# Patient Record
Sex: Male | Born: 1965
Health system: Southern US, Community
[De-identification: ages and names within clinical notes are randomized; demographics above are authoritative.]

## PROBLEM LIST (undated history)

## (undated) DIAGNOSIS — M109 Gout, unspecified: Secondary | ICD-10-CM

## (undated) DIAGNOSIS — K743 Primary biliary cirrhosis: Secondary | ICD-10-CM

## (undated) DIAGNOSIS — I639 Cerebral infarction, unspecified: Secondary | ICD-10-CM

## (undated) DIAGNOSIS — I10 Essential (primary) hypertension: Secondary | ICD-10-CM

## (undated) DIAGNOSIS — K219 Gastro-esophageal reflux disease without esophagitis: Secondary | ICD-10-CM

## (undated) DIAGNOSIS — R569 Unspecified convulsions: Secondary | ICD-10-CM

## (undated) HISTORY — DX: Primary biliary cirrhosis: K74.3

## (undated) HISTORY — DX: Unspecified convulsions: R56.9

## (undated) HISTORY — PX: OTHER SURGICAL HISTORY: SHX169

## (undated) HISTORY — DX: Cerebral infarction, unspecified: I63.9

## (undated) HISTORY — DX: Gastro-esophageal reflux disease without esophagitis: K21.9

---

## 2005-02-20 ENCOUNTER — Ambulatory Visit (HOSPITAL_COMMUNITY): Admission: RE | Admit: 2005-02-20 | Discharge: 2005-02-20 | Payer: Self-pay | Admitting: Family Medicine

## 2005-05-11 ENCOUNTER — Ambulatory Visit (HOSPITAL_COMMUNITY): Admission: RE | Admit: 2005-05-11 | Discharge: 2005-05-11 | Payer: Self-pay | Admitting: Family Medicine

## 2005-07-09 ENCOUNTER — Ambulatory Visit: Payer: Self-pay | Admitting: Internal Medicine

## 2005-07-10 ENCOUNTER — Ambulatory Visit (HOSPITAL_COMMUNITY): Admission: RE | Admit: 2005-07-10 | Discharge: 2005-07-10 | Payer: Self-pay | Admitting: Internal Medicine

## 2005-07-11 ENCOUNTER — Ambulatory Visit: Payer: Self-pay | Admitting: Internal Medicine

## 2007-02-06 ENCOUNTER — Ambulatory Visit (HOSPITAL_COMMUNITY): Admission: RE | Admit: 2007-02-06 | Discharge: 2007-02-06 | Payer: Self-pay | Admitting: Family Medicine

## 2009-03-10 ENCOUNTER — Encounter (INDEPENDENT_AMBULATORY_CARE_PROVIDER_SITE_OTHER): Payer: Self-pay | Admitting: General Surgery

## 2009-03-10 ENCOUNTER — Ambulatory Visit (HOSPITAL_COMMUNITY): Admission: RE | Admit: 2009-03-10 | Discharge: 2009-03-10 | Payer: Self-pay | Admitting: General Surgery

## 2010-07-23 ENCOUNTER — Encounter: Payer: Self-pay | Admitting: Internal Medicine

## 2012-04-14 ENCOUNTER — Ambulatory Visit (HOSPITAL_COMMUNITY)
Admission: RE | Admit: 2012-04-14 | Discharge: 2012-04-14 | Disposition: A | Discharge status: Home / Self Care | Payer: BC Managed Care – PPO | Source: Ambulatory Visit | Attending: Family Medicine | Admitting: Family Medicine

## 2012-04-14 ENCOUNTER — Other Ambulatory Visit (HOSPITAL_COMMUNITY): Payer: Self-pay | Admitting: Family Medicine

## 2012-04-14 DIAGNOSIS — S2341XA Sprain of ribs, initial encounter: Secondary | ICD-10-CM

## 2012-04-14 DIAGNOSIS — J984 Other disorders of lung: Secondary | ICD-10-CM | POA: Insufficient documentation

## 2012-04-14 DIAGNOSIS — R079 Chest pain, unspecified: Secondary | ICD-10-CM

## 2012-04-14 DIAGNOSIS — J9819 Other pulmonary collapse: Secondary | ICD-10-CM | POA: Insufficient documentation

## 2012-06-24 ENCOUNTER — Observation Stay (HOSPITAL_COMMUNITY)
Admission: EM | Admit: 2012-06-24 | Discharge: 2012-06-27 | DRG: 416 | Disposition: A | Discharge status: Home / Self Care | Payer: BC Managed Care – PPO | Attending: Internal Medicine | Admitting: Internal Medicine

## 2012-06-24 ENCOUNTER — Encounter (HOSPITAL_COMMUNITY): Payer: Self-pay | Admitting: *Deleted

## 2012-06-24 DIAGNOSIS — A498 Other bacterial infections of unspecified site: Secondary | ICD-10-CM | POA: Insufficient documentation

## 2012-06-24 DIAGNOSIS — R339 Retention of urine, unspecified: Secondary | ICD-10-CM | POA: Insufficient documentation

## 2012-06-24 DIAGNOSIS — N390 Urinary tract infection, site not specified: Secondary | ICD-10-CM | POA: Diagnosis present

## 2012-06-24 DIAGNOSIS — R000 Tachycardia, unspecified: Secondary | ICD-10-CM

## 2012-06-24 DIAGNOSIS — I4891 Unspecified atrial fibrillation: Secondary | ICD-10-CM | POA: Insufficient documentation

## 2012-06-24 DIAGNOSIS — K5900 Constipation, unspecified: Secondary | ICD-10-CM | POA: Insufficient documentation

## 2012-06-24 DIAGNOSIS — A419 Sepsis, unspecified organism: Secondary | ICD-10-CM | POA: Diagnosis present

## 2012-06-24 HISTORY — DX: Gout, unspecified: M10.9

## 2012-06-24 LAB — CBC WITH DIFFERENTIAL/PLATELET
Basophils Absolute: 0 10*3/uL (ref 0.0–0.1)
Basophils Relative: 0 % (ref 0–1)
Eosinophils Absolute: 0 10*3/uL (ref 0.0–0.7)
Eosinophils Relative: 0 % (ref 0–5)
HCT: 43.6 % (ref 39.0–52.0)
Hemoglobin: 15.7 g/dL (ref 13.0–17.0)
Lymphocytes Relative: 8 % — ABNORMAL LOW (ref 12–46)
Lymphs Abs: 0.9 10*3/uL (ref 0.7–4.0)
MCHC: 36 g/dL (ref 30.0–36.0)
MCV: 90.3 fL (ref 78.0–100.0)
Monocytes Absolute: 1 10*3/uL (ref 0.1–1.0)
Monocytes Relative: 8 % (ref 3–12)
Neutro Abs: 10.5 10*3/uL — ABNORMAL HIGH (ref 1.7–7.7)
Neutrophils Relative %: 84 % — ABNORMAL HIGH (ref 43–77)
Platelets: 129 10*3/uL — ABNORMAL LOW (ref 150–400)
RBC: 4.83 MIL/uL (ref 4.22–5.81)
WBC: 12.5 10*3/uL — ABNORMAL HIGH (ref 4.0–10.5)

## 2012-06-24 LAB — COMPREHENSIVE METABOLIC PANEL
ALT: 67 U/L — ABNORMAL HIGH (ref 0–53)
BUN: 13 mg/dL (ref 6–23)
CO2: 22 mEq/L (ref 19–32)
Calcium: 9.6 mg/dL (ref 8.4–10.5)
Chloride: 100 mEq/L (ref 96–112)
Creatinine, Ser: 1.17 mg/dL (ref 0.50–1.35)
GFR calc Af Amer: 84 mL/min — ABNORMAL LOW (ref >90)
GFR calc non Af Amer: 73 mL/min — ABNORMAL LOW (ref >90)
Glucose, Bld: 125 mg/dL — ABNORMAL HIGH (ref 70–99)
Potassium: 3.5 mEq/L (ref 3.5–5.1)
Sodium: 136 mEq/L (ref 135–145)
Total Bilirubin: 1.5 mg/dL — ABNORMAL HIGH (ref 0.3–1.2)
Total Protein: 7.8 g/dL (ref 6.0–8.3)

## 2012-06-24 LAB — URINE MICROSCOPIC-ADD ON

## 2012-06-24 LAB — URINALYSIS, ROUTINE W REFLEX MICROSCOPIC
Bilirubin Urine: NEGATIVE
Glucose, UA: NEGATIVE mg/dL
Ketones, ur: NEGATIVE mg/dL
Protein, ur: 30 mg/dL — AB
Specific Gravity, Urine: 1.028 (ref 1.005–1.030)
Urobilinogen, UA: 1 mg/dL (ref 0.0–1.0)

## 2012-06-24 MED ORDER — ONDANSETRON 4 MG PO TBDP
8.0000 mg | ORAL_TABLET | Freq: Once | ORAL | Status: AC
  Administered 2012-06-24: 8 mg via ORAL
  Filled 2012-06-24: qty 2

## 2012-06-24 MED ORDER — ACETAMINOPHEN 500 MG PO TABS
1000.0000 mg | ORAL_TABLET | Freq: Once | ORAL | Status: AC
  Administered 2012-06-24: 1000 mg via ORAL
  Filled 2012-06-24: qty 2

## 2012-06-24 MED ORDER — SODIUM CHLORIDE 0.9 % IV BOLUS (SEPSIS)
2000.0000 mL | Freq: Once | INTRAVENOUS | Status: AC
  Administered 2012-06-24: 2000 mL via INTRAVENOUS

## 2012-06-24 MED ORDER — OXYCODONE-ACETAMINOPHEN 5-325 MG PO TABS
1.0000 | ORAL_TABLET | Freq: Once | ORAL | Status: DC
  Filled 2012-06-24 (×2): qty 1

## 2012-06-24 MED ORDER — ONDANSETRON HCL 4 MG/2ML IJ SOLN
4.0000 mg | Freq: Once | INTRAMUSCULAR | Status: AC
  Administered 2012-06-24: 4 mg via INTRAVENOUS
  Filled 2012-06-24: qty 2

## 2012-06-24 NOTE — ED Provider Notes (Signed)
History     CSN: [id number]  Arrival date & time 06/24/12  2219   First MD Initiated Contact with Patient 06/24/12 2259      Chief Complaint  Patient presents with  . Urinary Retention    (Consider location/radiation/quality/duration/timing/severity/associated sxs/prior treatment) HPI Patient reports his difficulty urinating, onset yesterday "urine "just running out" urinating small amounts. Also, nausea diffuse headache and right knee pain and diminished appetite no treatment prior to coming here nothing makes symptoms better or worse no abdominal pain/. Knee pain feels like gout pain. He took one of his "gout pills" with partial relief knee pain denies abdominal pain denies cough no other complaint . No other associated symptoms. Past Medical History  Diagnosis Date  . Gout     Past Surgical History  Procedure Date  . Arm surgery    biceps repair   History reviewed. No pertinent family history.  History  Substance Use Topics  . Smoking status: Never Smoker   . Smokeless tobacco: Not on file  . Alcohol Use: No      Review of Systems  Genitourinary: Positive for dysuria and difficulty urinating.  Musculoskeletal: Positive for arthralgias.  Neurological: Positive for headaches.  All other systems reviewed and are negative.    Allergies  Review of patient's allergies indicates no known allergies.  Home Medications   Current Outpatient Rx  Name  Route  Sig  Dispense  Refill  . FEBUXOSTAT 40 MG PO TABS   Oral   Take 80 mg by mouth daily.           BP 160/98  Pulse 136  Temp 99.2 F (37.3 C) (Oral)  Resp 16  SpO2 96%  Physical Exam  Nursing note and vitals reviewed. Constitutional: He appears well-developed and well-nourished. He appears distressed.       Nontoxic appearing appears uncomfortable  HENT:  Head: Normocephalic and atraumatic.  Eyes: Conjunctivae normal are normal. Pupils are equal, round, and reactive to light.  Neck: Neck supple. No  tracheal deviation present. No thyromegaly present.  Cardiovascular: Regular rhythm.   No murmur heard.      Tachycardic  Pulmonary/Chest: Effort normal and breath sounds normal.  Abdominal: Soft. Bowel sounds are normal. He exhibits no distension. There is no tenderness.  Genitourinary: Rectum normal, prostate normal and penis normal. Guaiac negative stool.       Circumcised  Musculoskeletal: Normal range of motion. He exhibits no edema and no tenderness.  Neurological: He is alert. Coordination normal.  Skin: Skin is warm and dry. No rash noted.  Psychiatric: He has a normal mood and affect.   Results for orders placed during the hospital encounter of 06/24/12  CBC WITH DIFFERENTIAL      Component Value Range   WBC 12.5 (*) 4.0 - 10.5 K/uL   RBC 4.83  4.22 - 5.81 MIL/uL   Hemoglobin 15.7  13.0 - 17.0 g/dL   HCT 40.9  81.1 - 91.4 %   MCV 90.3  78.0 - 100.0 fL   MCH 32.5  26.0 - 34.0 pg   MCHC 36.0  30.0 - 36.0 g/dL   RDW 78.2  95.6 - 21.3 %   Platelets 129 (*) 150 - 400 K/uL   Neutrophils Relative 84 (*) 43 - 77 %   Neutro Abs 10.5 (*) 1.7 - 7.7 K/uL   Lymphocytes Relative 8 (*) 12 - 46 %   Lymphs Abs 0.9  0.7 - 4.0 K/uL   Monocytes Relative 8  3 -  12 %   Monocytes Absolute 1.0  0.1 - 1.0 K/uL   Eosinophils Relative 0  0 - 5 %   Eosinophils Absolute 0.0  0.0 - 0.7 K/uL   Basophils Relative 0  0 - 1 %   Basophils Absolute 0.0  0.0 - 0.1 K/uL  COMPREHENSIVE METABOLIC PANEL      Component Value Range   Sodium 136  135 - 145 mEq/L   Potassium 3.5  3.5 - 5.1 mEq/L   Chloride 100  96 - 112 mEq/L   CO2 22  19 - 32 mEq/L   Glucose, Bld 125 (*) 70 - 99 mg/dL   BUN 13  6 - 23 mg/dL   Creatinine, Ser 1.61  0.50 - 1.35 mg/dL   Calcium 9.6  8.4 - 09.6 mg/dL   Total Protein 7.8  6.0 - 8.3 g/dL   Albumin 4.1  3.5 - 5.2 g/dL   AST 41 (*) 0 - 37 U/L   ALT 67 (*) 0 - 53 U/L   Alkaline Phosphatase 76  39 - 117 U/L   Total Bilirubin 1.5 (*) 0.3 - 1.2 mg/dL   GFR calc non Af Amer 73  (*) >90 mL/min   GFR calc Af Amer 84 (*) >90 mL/min  URINALYSIS, ROUTINE W REFLEX MICROSCOPIC      Component Value Range   Color, Urine YELLOW  YELLOW   APPearance TURBID (*) CLEAR   Specific Gravity, Urine 1.028  1.005 - 1.030   pH 6.0  5.0 - 8.0   Glucose, UA NEGATIVE  NEGATIVE mg/dL   Hgb urine dipstick MODERATE (*) NEGATIVE   Bilirubin Urine NEGATIVE  NEGATIVE   Ketones, ur NEGATIVE  NEGATIVE mg/dL   Protein, ur 30 (*) NEGATIVE mg/dL   Urobilinogen, UA 1.0  0.0 - 1.0 mg/dL   Nitrite POSITIVE (*) NEGATIVE   Leukocytes, UA LARGE (*) NEGATIVE  URINE MICROSCOPIC-ADD ON      Component Value Range   Squamous Epithelial / LPF RARE  RARE   WBC, UA TOO NUMEROUS TO COUNT  <3 WBC/hpf   RBC / HPF 11-20  <3 RBC/hpf   Bacteria, UA MANY (*) RARE   Urine-Other MUCOUS PRESENT    CG4 I-STAT (LACTIC ACID)      Component Value Range   Lactic Acid, Venous 3.19 (*) 0.5 - 2.2 mmol/L   No results found.  ED Course  Procedures (including critical care time)  Date: 06/24/2012  Rate: 135  Rhythm: sinus tachycardia  QRS Axis: right  Intervals: normal  ST/T Wave abnormalities: nonspecific T wave changes  Conduction Disutrbances:none  Narrative Interpretation:   Old EKG Reviewed: none available   Labs Reviewed  CBC WITH DIFFERENTIAL  COMPREHENSIVE METABOLIC PANEL  URINALYSIS, ROUTINE W REFLEX MICROSCOPIC   No results found.   No diagnosis found.  12:20 AM patient reports still complains of headache, nausea has resolved after treatment with intravenous fluids, Zofran and Tylenol. Morphine ordered 1 AM reports no relief of headache after treatment with intravenous morphine. Remains alert Glasgow Coma Score 15  MDM  Code sepsis called Spoke with hospitalist physician Dr. Julian Reil. Plan admission for 23 hour observation Diagnosis #1 sepsis #2 urinary tract infection  #3 headache #4 right knee pain  CRITICAL CARE Performed by: Doug Sou   Total critical care time: 30  minute  Critical care time was exclusive of separately billable procedures and treating other patients.  Critical care was necessary to treat or prevent imminent or life-threatening deterioration.  Critical care  was time spent personally by me on the following activities: development of treatment plan with patient and/or surrogate as well as nursing, discussions with consultants, evaluation of patient's response to treatment, examination of patient, obtaining history from patient or surrogate, ordering and performing treatments and interventions, ordering and review of laboratory studies, ordering and review of radiographic studies, pulse oximetry and re-evaluation of patient's condition.    Doug Sou, MD 06/25/12 [phone]

## 2012-06-24 NOTE — ED Notes (Signed)
Bladder scanner reads no more than 70 ml of urine - Dr Rennis Chris updated

## 2012-06-24 NOTE — ED Notes (Signed)
Pt states is here for urinary retention since this am.  Denies ever having this.  States he is also nauseated but denies vomiting.

## 2012-06-25 ENCOUNTER — Inpatient Hospital Stay (HOSPITAL_COMMUNITY): Payer: BC Managed Care – PPO

## 2012-06-25 DIAGNOSIS — A419 Sepsis, unspecified organism: Secondary | ICD-10-CM | POA: Diagnosis present

## 2012-06-25 DIAGNOSIS — R000 Tachycardia, unspecified: Secondary | ICD-10-CM

## 2012-06-25 DIAGNOSIS — N390 Urinary tract infection, site not specified: Secondary | ICD-10-CM

## 2012-06-25 LAB — CBC
HCT: 42.1 % (ref 39.0–52.0)
Hemoglobin: 15.1 g/dL (ref 13.0–17.0)
Hemoglobin: 15.4 g/dL (ref 13.0–17.0)
MCHC: 35.9 g/dL (ref 30.0–36.0)
Platelets: 152 10*3/uL (ref 150–400)
RDW: 13.9 % (ref 11.5–15.5)
WBC: 16.2 10*3/uL — ABNORMAL HIGH (ref 4.0–10.5)

## 2012-06-25 LAB — CREATININE, SERUM
Creatinine, Ser: 1.06 mg/dL (ref 0.50–1.35)
GFR calc Af Amer: >90 mL/min (ref >90)
GFR calc non Af Amer: 82 mL/min — ABNORMAL LOW (ref >90)

## 2012-06-25 LAB — HEMOGLOBIN A1C
Hgb A1c MFr Bld: 5 % (ref <5.7)
Mean Plasma Glucose: 97 mg/dL (ref <117)

## 2012-06-25 LAB — BASIC METABOLIC PANEL
GFR calc Af Amer: >90 mL/min (ref >90)
GFR calc non Af Amer: 82 mL/min — ABNORMAL LOW (ref >90)
Glucose, Bld: 126 mg/dL — ABNORMAL HIGH (ref 70–99)
Potassium: 3.8 mEq/L (ref 3.5–5.1)
Sodium: 141 mEq/L (ref 135–145)

## 2012-06-25 LAB — GLUCOSE, CAPILLARY: Glucose-Capillary: 127 mg/dL — ABNORMAL HIGH (ref 70–99)

## 2012-06-25 LAB — CG4 I-STAT (LACTIC ACID): Lactic Acid, Venous: 3.19 mmol/L — ABNORMAL HIGH (ref 0.5–2.2)

## 2012-06-25 MED ORDER — SODIUM CHLORIDE 0.9 % IV BOLUS (SEPSIS)
1000.0000 mL | Freq: Once | INTRAVENOUS | Status: AC
  Administered 2012-06-25: 1000 mL via INTRAVENOUS

## 2012-06-25 MED ORDER — HEPARIN SODIUM (PORCINE) 5000 UNIT/ML IJ SOLN
5000.0000 [IU] | Freq: Three times a day (TID) | INTRAMUSCULAR | Status: DC
  Administered 2012-06-25 – 2012-06-27 (×8): 5000 [IU] via SUBCUTANEOUS
  Filled 2012-06-25 (×10): qty 1

## 2012-06-25 MED ORDER — DEXTROSE 5 % IV SOLN
1.0000 g | INTRAVENOUS | Status: DC
  Administered 2012-06-25 – 2012-06-27 (×2): 1 g via INTRAVENOUS
  Filled 2012-06-25 (×3): qty 10

## 2012-06-25 MED ORDER — GENTAMICIN SULFATE 40 MG/ML IJ SOLN: 1.5000 mg/kg | Freq: Once | INTRAVENOUS | Status: DC

## 2012-06-25 MED ORDER — OXYCODONE-ACETAMINOPHEN 5-325 MG PO TABS
1.0000 | ORAL_TABLET | Freq: Four times a day (QID) | ORAL | Status: DC | PRN
  Administered 2012-06-25: 2 via ORAL
  Filled 2012-06-25: qty 2

## 2012-06-25 MED ORDER — SODIUM CHLORIDE 0.9 % IV SOLN
INTRAVENOUS | Status: DC
  Administered 2012-06-25 – 2012-06-27 (×5): via INTRAVENOUS

## 2012-06-25 MED ORDER — DEXTROSE 5 % IV SOLN
1.0000 g | Freq: Once | INTRAVENOUS | Status: AC
  Administered 2012-06-25: 1 g via INTRAVENOUS
  Filled 2012-06-25: qty 10

## 2012-06-25 MED ORDER — DEXTROSE 5 % IV SOLN
110.0000 mg | Freq: Once | INTRAVENOUS | Status: AC
  Administered 2012-06-25: 110 mg via INTRAVENOUS
  Filled 2012-06-25: qty 2.75

## 2012-06-25 MED ORDER — POTASSIUM CHLORIDE CRYS ER 20 MEQ PO TBCR
40.0000 meq | EXTENDED_RELEASE_TABLET | Freq: Once | ORAL | Status: AC
  Administered 2012-06-25: 40 meq via ORAL
  Filled 2012-06-25: qty 2

## 2012-06-25 MED ORDER — HYDROMORPHONE HCL PF 1 MG/ML IJ SOLN
0.5000 mg | INTRAMUSCULAR | Status: DC | PRN
  Administered 2012-06-25 (×3): 0.5 mg via INTRAVENOUS
  Filled 2012-06-25 (×4): qty 1

## 2012-06-25 MED ORDER — MORPHINE SULFATE 4 MG/ML IJ SOLN
4.0000 mg | Freq: Once | INTRAMUSCULAR | Status: AC
  Administered 2012-06-25: 4 mg via INTRAVENOUS
  Filled 2012-06-25: qty 1

## 2012-06-25 MED ORDER — HYDROMORPHONE HCL PF 1 MG/ML IJ SOLN
1.0000 mg | Freq: Once | INTRAMUSCULAR | Status: AC
  Administered 2012-06-25: 1 mg via INTRAVENOUS

## 2012-06-25 MED ORDER — HYDROMORPHONE HCL PF 1 MG/ML IJ SOLN
1.0000 mg | INTRAMUSCULAR | Status: DC | PRN
  Administered 2012-06-25 (×2): 1 mg via INTRAVENOUS
  Filled 2012-06-25 (×2): qty 1

## 2012-06-25 MED ORDER — FEBUXOSTAT 40 MG PO TABS
80.0000 mg | ORAL_TABLET | Freq: Every day | ORAL | Status: DC
  Administered 2012-06-25 – 2012-06-27 (×3): 80 mg via ORAL
  Filled 2012-06-25 (×3): qty 2

## 2012-06-25 MED ORDER — OXYCODONE-ACETAMINOPHEN 5-325 MG PO TABS
1.0000 | ORAL_TABLET | ORAL | Status: DC | PRN
  Administered 2012-06-25 – 2012-06-27 (×6): 2 via ORAL
  Filled 2012-06-25 (×6): qty 2

## 2012-06-25 MED ORDER — PHENAZOPYRIDINE HCL 200 MG PO TABS
200.0000 mg | ORAL_TABLET | Freq: Three times a day (TID) | ORAL | Status: AC
  Administered 2012-06-25 – 2012-06-26 (×6): 200 mg via ORAL
  Filled 2012-06-25 (×6): qty 1

## 2012-06-25 MED ORDER — SODIUM CHLORIDE 0.9 % IJ SOLN
3.0000 mL | Freq: Two times a day (BID) | INTRAMUSCULAR | Status: DC
  Administered 2012-06-26 – 2012-06-27 (×2): 3 mL via INTRAVENOUS

## 2012-06-25 NOTE — Progress Notes (Signed)
Pt bladder pain 10/10 with BP 153/102 and HR 112. MD on-call notified, order given for Dilaudid 1mg  IV now and Pyridium 200mg  PO TID. Will continue to monitor.  Harless Litten, RN 06/25/12

## 2012-06-25 NOTE — Progress Notes (Signed)
[facility] PROGRESS NOTE  Jason Mcmahon:[id number] DOB: 21-Aug-1965 DOA: 06/24/2012 PCP: Colette Ribas, MD  HPI/Subjective: She complaining about a lot of suprapubic pain and irritation   Assessment/Plan:  Sepsis -Apparent with tachycardia, fever and leukocytosis. -Likely secondary to UTI (urosepsis) patient started on Rocephin. -Urine and blood cultures pending.  UTI -Patient has urinalysis consistent with UTI, has very severe UTI symptoms. -Started on Rocephin empirically, we will adjust antibiotics according to the culture results. -I will obtain abdominal ultrasound to rule out anatomical abnormalities.  Tachycardia -Secondary to his sepsis and pain. 12-lead EKG shows sinus tachycardia. -Manage his pain symptomatically and hydrated aggressively with IV fluids.   Code Status: Full code Family Communication: Spoken the patient, his wife is at bedside too. Disposition Plan: Remains inpatient  Consultants:  None  Procedures:  None  Antibiotics:  Rocephin started 06/24/2012  Objective: Filed Vitals:   06/25/12 0415 06/25/12 0429 06/25/12 0515 06/25/12 0605  BP: 168/106 168/106 135/92 153/102  Pulse: 142 148 104 112  Temp:    98.1 F (36.7 C)  TempSrc:    Oral  Resp:      Height:      Weight:      SpO2: 98% 98%  95%    Intake/Output Summary (Last 24 hours) at 06/25/12 1111 Last data filed at 06/25/12 0538  Gross per 24 hour  Intake      0 ml  Output    700 ml  Net   -700 ml   Filed Weights   06/25/12 0106  Weight: 87.544 kg (193 lb)    Exam:  General: Alert and awake, oriented x3, not in any acute distress. HEENT: anicteric sclera, pupils reactive to light and accommodation, EOMI CVS: S1-S2 clear, no murmur rubs or gallops Chest: clear to auscultation bilaterally, no wheezing, rales or rhonchi Abdomen: soft nontender, nondistended, normal bowel sounds, no organomegaly Extremities: no cyanosis, clubbing or edema noted  bilaterally Neuro: Cranial nerves II-XII intact, no focal neurological deficits  Data Reviewed: Basic Metabolic Panel:  Lab 06/25/12 8119 06/25/12 0424 06/24/12 2318  NA 141 -- 136  K 3.8 -- 3.5  CL 107 -- 100  CO2 20 -- 22  GLUCOSE 126* -- 125*  BUN 11 -- 13  CREATININE 1.06 1.06 1.17  CALCIUM 9.5 -- 9.6  MG -- -- --  PHOS -- -- --   Liver Function Tests:  Lab 06/24/12 2318  AST 41*  ALT 67*  ALKPHOS 76  BILITOT 1.5*  PROT 7.8  ALBUMIN 4.1   No results found for this basename: LIPASE:5,AMYLASE:5 in the last 168 hours No results found for this basename: AMMONIA:5 in the last 168 hours CBC:  Lab 06/25/12 0425 06/25/12 0424 06/24/12 2318  WBC 16.4* 16.2* 12.5*  NEUTROABS -- -- 10.5*  HGB 15.4 15.1 15.7  HCT 42.9 42.1 43.6  MCV 90.3 90.3 90.3  PLT 152 150 129*   Cardiac Enzymes: No results found for this basename: CKTOTAL:5,CKMB:5,CKMBINDEX:5,TROPONINI:5 in the last 168 hours BNP (last 3 results) No results found for this basename: PROBNP:3 in the last 8760 hours CBG:  Lab 06/25/12 0723  GLUCAP 127*    No results found for this or any previous visit (from the past 240 hour(s)).   Studies: No results found.  Scheduled Meds:   . cefTRIAXone (ROCEPHIN)  IV  1 g Intravenous Q24H  . febuxostat  80 mg Oral Daily  . heparin  5,000 Units Subcutaneous Q8H  . phenazopyridine  200 mg Oral  TID WC  . sodium chloride  1,000 mL Intravenous Once  . sodium chloride  3 mL Intravenous Q12H   Continuous Infusions:   . sodium chloride 125 mL/hr at 06/25/12 0451    Principal Problem:  *Sepsis secondary to UTI    Time spent: 45 minutes    [employer] [facility]  [facility] Pager [phone]. If 8PM-8AM, please contact night-coverage at [email or web address], password [facility] 06/25/2012, 11:11 AM  LOS: 1 day

## 2012-06-25 NOTE — ED Notes (Signed)
Rectal temp 101.0 taken md aware.

## 2012-06-25 NOTE — Progress Notes (Signed)
Patient has only voided 200 ml of urine since breakfast.  Bladder distended, palpable & patient having extreme pain.  Bladder scan revealed volume of 736 ml in bladder currently.  MD notified.  Will continue to monitor. Nolon Nations

## 2012-06-25 NOTE — H&P (Signed)
[facility] History and Physical  Jason Mcmahon NWG:[id number] DOB: Jul 28, 1965 DOA: 06/24/2012  Referring physician: ED PCP: Jason Ribas, MD  Specialists: None  Chief Complaint: Dysuria  HPI: Jason Mcmahon is a 46 y.o. male who presents with c/o feeling like he is having difficulty urinating.  Patient c/o pain and burning on urination onset yesterday.  Also associated with nausea, headache, and poor appetite.  Nothing seems to make these symptoms better nor worse.  When he tries to go to the bathroom he has sensation of incomplete emptying.  In the ED patient was found to have a UTI, with associated sepsis 3/4 SIRS criteria including fever, tachycardia, and leukocytosis.  Despite his feeling of incomplete bladder emptying a bladder scan revealed only 70cc of urine remaining after urination thus providing no evidence of retention.  Hospitalist has been asked to admit the patient.  Review of Systems: no subjective fever, chills, vomiting, diarrhea, 12 systems reviewed and otherwise negative.  Past Medical History  Diagnosis Date  . Gout    Past Surgical History  Procedure Date  . Arm surgery    Social History:  reports that he has never smoked. He does not have any smokeless tobacco history on file. He reports that he does not drink alcohol or use illicit drugs.   No Known Allergies  History reviewed. No pertinent family history.   Prior to Admission medications   Medication Sig Start Date End Date Taking? Authorizing Provider  febuxostat (ULORIC) 40 MG tablet Take 80 mg by mouth daily.   Yes Historical Provider, MD   Physical Exam: Filed Vitals:   06/25/12 0115 06/25/12 0145 06/25/12 0230 06/25/12 0245  BP: 149/92 140/89 134/97 120/77  Pulse: 112 118 117 113  Temp:      TempSrc:      Resp:      Height:      Weight:      SpO2: 95% 96% 94% 91%    General:  NAD, appears uncomfortable from dysuria sensation however Eyes: PEERLA EOMI ENT: mucous membranes  moist Neck: supple w/o JVD Cardiovascular: RRR w/o MRG Respiratory: CTA B Abdomen: soft, nt, nd, bs+ Skin: no rash nor lesion Musculoskeletal: MAE, full ROM all 4 extremities Psychiatric: normal tone and affect Neurologic: AAOx3, grossly non-focal  Labs on Admission:  Basic Metabolic Panel:  Lab 06/24/12 5784  NA 136  K 3.5  CL 100  CO2 22  GLUCOSE 125*  BUN 13  CREATININE 1.17  CALCIUM 9.6  MG --  PHOS --   Liver Function Tests:  Lab 06/24/12 2318  AST 41*  ALT 67*  ALKPHOS 76  BILITOT 1.5*  PROT 7.8  ALBUMIN 4.1   No results found for this basename: LIPASE:5,AMYLASE:5 in the last 168 hours No results found for this basename: AMMONIA:5 in the last 168 hours CBC:  Lab 06/24/12 2318  WBC 12.5*  NEUTROABS 10.5*  HGB 15.7  HCT 43.6  MCV 90.3  PLT 129*   Cardiac Enzymes: No results found for this basename: CKTOTAL:5,CKMB:5,CKMBINDEX:5,TROPONINI:5 in the last 168 hours  BNP (last 3 results) No results found for this basename: PROBNP:3 in the last 8760 hours CBG: No results found for this basename: GLUCAP:5 in the last 168 hours  Radiological Exams on Admission: No results found.  EKG: Independently reviewed.  Assessment/Plan Principal Problem:  *Sepsis secondary to UTI   1. Sepsis secondary to UTI - patient received rocephin and gent in ED, will continue the rocephin, cultures pending, received 2L  ivf bolus with subsequent improvement in tachycardia in ED, will continue fluids at 125 cc/hr.  Admit patient to obs, likely can go home once his symptoms improve.  Will treat his pain with low dose of dilaudid while here for dysuria symptoms.  Code Status: Full Code (must indicate code status--if unknown or must be presumed, indicate so) Family Communication: spoke with wife at bedside (indicate person spoken with, if applicable, with phone number if by telephone) Disposition Plan: Admit to obs (indicate anticipated LOS)  Time spent: 50 min  GARDNER,  JARED M. [facility] Pager [phone]  If 7PM-7AM, please contact night-coverage [email or web address] Password [facility] 06/25/2012, 4:27 AM

## 2012-06-25 NOTE — Progress Notes (Signed)
In & Out Cath performed and yielded 900 ml of urine.  Will continue to monitor. Nolon Nations

## 2012-06-25 NOTE — ED Notes (Signed)
Patient given adult diaper per request. States "I keep getting up to pee and it hurts. It's just a trickle I can't really go." Family member assisted placement of diaper.

## 2012-06-25 NOTE — ED Notes (Signed)
Hospitalist at bedside 

## 2012-06-25 NOTE — ED Notes (Signed)
Pt states would like to try to have bm.  Bedside commode  Placed at bedside.

## 2012-06-25 NOTE — Progress Notes (Signed)
In & Out cath performed on patient while yielded 750 ml of urine.  Patient stated he felt immediate relief of his bladder pressure.  Will continue to monitor. Nolon Nations

## 2012-06-25 NOTE — ED Notes (Signed)
Report given to rn.  Pt transported via stretcher to floor.

## 2012-06-26 LAB — URINE CULTURE: Colony Count: >=100000

## 2012-06-26 LAB — COMPREHENSIVE METABOLIC PANEL
ALT: 43 U/L (ref 0–53)
AST: 20 U/L (ref 0–37)
Alkaline Phosphatase: 72 U/L (ref 39–117)
CO2: 23 mEq/L (ref 19–32)
Chloride: 104 mEq/L (ref 96–112)
GFR calc Af Amer: >90 mL/min (ref >90)
GFR calc non Af Amer: >90 mL/min (ref >90)
Glucose, Bld: 94 mg/dL (ref 70–99)
Potassium: 3.8 mEq/L (ref 3.5–5.1)
Sodium: 138 mEq/L (ref 135–145)

## 2012-06-26 LAB — CBC
Hemoglobin: 12.9 g/dL — ABNORMAL LOW (ref 13.0–17.0)
MCH: 32.3 pg (ref 26.0–34.0)
Platelets: 114 10*3/uL — ABNORMAL LOW (ref 150–400)
RBC: 4 MIL/uL — ABNORMAL LOW (ref 4.22–5.81)
WBC: 8.8 10*3/uL (ref 4.0–10.5)

## 2012-06-26 LAB — OCCULT BLOOD, POC DEVICE: Fecal Occult Bld: NEGATIVE

## 2012-06-26 MED ORDER — PANTOPRAZOLE SODIUM 40 MG PO TBEC
40.0000 mg | DELAYED_RELEASE_TABLET | Freq: Every day | ORAL | Status: DC
  Administered 2012-06-26 – 2012-06-27 (×2): 40 mg via ORAL
  Filled 2012-06-26 (×2): qty 1

## 2012-06-26 MED ORDER — POTASSIUM CHLORIDE CRYS ER 20 MEQ PO TBCR
EXTENDED_RELEASE_TABLET | ORAL | Status: AC
  Filled 2012-06-26: qty 2

## 2012-06-26 MED ORDER — CALCIUM CARBONATE ANTACID 500 MG PO CHEW
1.0000 | CHEWABLE_TABLET | Freq: Three times a day (TID) | ORAL | Status: DC
  Administered 2012-06-26 – 2012-06-27 (×5): 200 mg via ORAL
  Filled 2012-06-26 (×6): qty 1

## 2012-06-26 MED ORDER — POTASSIUM CHLORIDE CRYS ER 20 MEQ PO TBCR
40.0000 meq | EXTENDED_RELEASE_TABLET | Freq: Once | ORAL | Status: AC
  Administered 2012-06-26: 40 meq via ORAL

## 2012-06-26 NOTE — Progress Notes (Signed)
Around 0000 Pt stated his bladder felt full. A bladder scan was done showing 625 cc.urine. An in and out cath was attempted no resistance was met. However, there was no urine return. Md made aware new order received for a foley. A foley was attempted however there was no urine return. The balloon was not inflated.  Np on call made aware. New order received for a coude cath. Coude cath was inserted pt drained 700 cc of urine. Pt is now resting in bed.

## 2012-06-26 NOTE — Progress Notes (Signed)
[facility] PROGRESS NOTE  VICENTE WEIDLER OZD:[id number] DOB: [birth year] DOA: 06/24/2012 PCP: Colette Ribas, MD  HPI/Subjective: Bladder pain is much better after catheter insertion   Assessment/Plan:  Sepsis -Apparent with tachycardia, fever and leukocytosis. -Likely secondary to UTI (urosepsis) patient started on Rocephin. -Urine and blood cultures pending.  UTI -Patient has urinalysis consistent with UTI, culture growing Escherichia coli, susceptibility is pending -Started on Rocephin empirically, we will adjust antibiotics according to the culture results. -I will obtain abdominal ultrasound to rule out anatomical abnormalities.  Tachycardia -Secondary to his sepsis and pain. 12-lead EKG shows sinus tachycardia. This is improved today. -Manage his pain symptomatically and hydrated aggressively with IV fluids.  Urinary retention -Robinson catheter used for more than 3 times, Foley catheter insertion was unsuccessful. -Placement of coud catheter. Urine retention is likely secondary to narcotics.  Code Status: Full code Family Communication: Spoken the patient, his wife is at bedside too. Disposition Plan: Remains inpatient  Consultants:  None  Procedures:  None  Antibiotics:  Rocephin started 06/24/2012  Objective: Filed Vitals:   06/25/12 0605 06/25/12 1330 06/25/12 2100 06/26/12 0500  BP: 153/102 144/90 138/87 155/94  Pulse: 112 106 103 100  Temp: 98.1 F (36.7 C) 98.7 F (37.1 C) 98.1 F (36.7 C) 98.2 F (36.8 C)  TempSrc: Oral     Resp:  20 18 18   Height:  5\' 8"  (1.727 m)    Weight:  87.544 kg (193 lb)  87.816 kg (193 lb 9.6 oz)  SpO2: 95% 97% 95% 93%    Intake/Output Summary (Last 24 hours) at 06/26/12 1023 Last data filed at 06/26/12 0600  Gross per 24 hour  Intake 4623.75 ml  Output   4200 ml  Net 423.75 ml   Filed Weights   06/25/12 0106 06/25/12 1330 06/26/12 0500  Weight: 87.544 kg (193 lb) 87.544 kg (193 lb) 87.816 kg  (193 lb 9.6 oz)    Exam:  General: Alert and awake, oriented x3, not in any acute distress. HEENT: anicteric sclera, pupils reactive to light and accommodation, EOMI CVS: S1-S2 clear, no murmur rubs or gallops Chest: clear to auscultation bilaterally, no wheezing, rales or rhonchi Abdomen: soft nontender, nondistended, normal bowel sounds, no organomegaly Extremities: no cyanosis, clubbing or edema noted bilaterally Neuro: Cranial nerves II-XII intact, no focal neurological deficits  Data Reviewed: Basic Metabolic Panel:  Lab 06/26/12 2595 06/25/12 0425 06/25/12 0424 06/24/12 2318  NA 138 141 -- 136  K 3.8 3.8 -- 3.5  CL 104 107 -- 100  CO2 23 20 -- 22  GLUCOSE 94 126* -- 125*  BUN 10 11 -- 13  CREATININE 0.99 1.06 1.06 1.17  CALCIUM 9.0 9.5 -- 9.6  MG -- -- -- --  PHOS -- -- -- --   Liver Function Tests:  Lab 06/26/12 0615 06/24/12 2318  AST 20 41*  ALT 43 67*  ALKPHOS 72 76  BILITOT 1.6* 1.5*  PROT 7.1 7.8  ALBUMIN 3.5 4.1   No results found for this basename: LIPASE:5,AMYLASE:5 in the last 168 hours No results found for this basename: AMMONIA:5 in the last 168 hours CBC:  Lab 06/26/12 0615 06/25/12 0425 06/25/12 0424 06/24/12 2318  WBC 8.8 16.4* 16.2* 12.5*  NEUTROABS -- -- -- 10.5*  HGB 12.9* 15.4 15.1 15.7  HCT 36.9* 42.9 42.1 43.6  MCV 92.3 90.3 90.3 90.3  PLT 114* 152 150 129*   Cardiac Enzymes: No results found for this basename: CKTOTAL:5,CKMB:5,CKMBINDEX:5,TROPONINI:5 in the last 168 hours BNP (  last 3 results) No results found for this basename: PROBNP:3 in the last 8760 hours CBG:  Lab 06/26/12 0808 06/25/12 0723  GLUCAP 87 127*    Recent Results (from the past 240 hour(s))  URINE CULTURE     Status: Normal (Preliminary result)   Collection Time   06/24/12 10:45 PM      Component Value Range Status Comment   Specimen Description URINE, RANDOM   Final    Special Requests CX ADDED AT 2309 ON [id number]   Final    Culture  Setup Time 06/25/2012  00:42   Final    Colony Count >=100,000 COLONIES/ML   Final    Culture ESCHERICHIA COLI   Final    Report Status PENDING   Incomplete   CULTURE, BLOOD (ROUTINE X 2)     Status: Normal (Preliminary result)   Collection Time   06/25/12 12:30 AM      Component Value Range Status Comment   Specimen Description BLOOD RIGHT ARM   Final    Special Requests BOTTLES DRAWN AEROBIC AND ANAEROBIC 10CC EACH   Final    Culture  Setup Time 06/25/2012 04:15   Final    Culture     Final    Value:        BLOOD CULTURE RECEIVED NO GROWTH TO DATE CULTURE WILL BE HELD FOR 5 DAYS BEFORE ISSUING A FINAL NEGATIVE REPORT   Report Status PENDING   Incomplete   CULTURE, BLOOD (ROUTINE X 2)     Status: Normal (Preliminary result)   Collection Time   06/25/12 12:32 AM      Component Value Range Status Comment   Specimen Description BLOOD RIGHT HAND   Final    Special Requests BOTTLES DRAWN AEROBIC ONLY 8CC   Final    Culture  Setup Time 06/25/2012 04:15   Final    Culture     Final    Value:        BLOOD CULTURE RECEIVED NO GROWTH TO DATE CULTURE WILL BE HELD FOR 5 DAYS BEFORE ISSUING A FINAL NEGATIVE REPORT   Report Status PENDING   Incomplete      Studies: US Renal  06/25/2012  *RADIOLOGY REPORT*  Clinical Data: UTI and sepsis.  RENAL/URINARY TRACT ULTRASOUND COMPLETE  Comparison:  07/10/2005  Findings:  Right Kidney:  10.19 cm in length.  Normal renal cortex.  No obstruction or mass.  Left Kidney:  11.2 cm in length.  Normal renal cortex without obstruction or mass.  Bladder:  Normal bladder without thickening or mass.  IMPRESSION: Normal   Original Report Authenticated By: Janeece Riggers, M.D.     Scheduled Meds:    . calcium carbonate  1 tablet Oral TID WC  . cefTRIAXone (ROCEPHIN)  IV  1 g Intravenous Q24H  . febuxostat  80 mg Oral Daily  . heparin  5,000 Units Subcutaneous Q8H  . pantoprazole  40 mg Oral Daily  . phenazopyridine  200 mg Oral TID WC  . sodium chloride  3 mL Intravenous Q12H    Continuous Infusions:    . sodium chloride 125 mL/hr at 06/26/12 5621    Principal Problem:  *Sepsis Active Problems:  UTI (lower urinary tract infection)  Tachycardia    Time spent: 45 minutes    [facility]  [facility] Pager [phone]. If 8PM-8AM, please contact night-coverage at [email or web address], password [facility] 06/26/2012, 10:23 AM  LOS: 2 days

## 2012-06-27 LAB — BASIC METABOLIC PANEL
CO2: 22 mEq/L (ref 19–32)
Calcium: 9.4 mg/dL (ref 8.4–10.5)
Chloride: 101 mEq/L (ref 96–112)
Creatinine, Ser: 1.07 mg/dL (ref 0.50–1.35)
Glucose, Bld: 90 mg/dL (ref 70–99)

## 2012-06-27 LAB — GLUCOSE, CAPILLARY: Glucose-Capillary: 92 mg/dL (ref 70–99)

## 2012-06-27 MED ORDER — OXYCODONE-ACETAMINOPHEN 5-325 MG PO TABS: 1.0000 | ORAL_TABLET | ORAL | Status: DC | PRN

## 2012-06-27 MED ORDER — CIPROFLOXACIN HCL 500 MG PO TABS: 500.0000 mg | ORAL_TABLET | Freq: Two times a day (BID) | ORAL | Status: DC

## 2012-06-27 NOTE — Care Management Note (Signed)
    Page 1 of 1   06/27/2012     1:45:24 PM   CARE MANAGEMENT NOTE 06/27/2012  Patient:  Jason Mcmahon, Jason Mcmahon   Account Number:  [id number]  Date Initiated:  06/27/2012  Documentation initiated by:  GRAVES-BIGELOW,Jossalyn Forgione  Subjective/Objective Assessment:   Pt admitted with dysuria and increased WBC. IV ABX and IV hydration.     Action/Plan:   Pt planned for d/c today. No needs from CM at this time.   Anticipated DC Date:  06/27/2012   Anticipated DC Plan:  HOME/SELF CARE      DC Planning Services  CM consult      Choice offered to / List presented to:             Status of service:  Completed, signed off Medicare Important Message given?   (If response is "NO", the following Medicare IM given date fields will be blank) Date Medicare IM given:   Date Additional Medicare IM given:    Discharge Disposition:  HOME/SELF CARE  Per UR Regulation:  Reviewed for med. necessity/level of care/duration of stay  If discussed at Long Length of Stay Meetings, dates discussed:    Comments:

## 2012-06-27 NOTE — Progress Notes (Signed)
UR Completed Bertice Risse Graves-Bigelow, RN,BSN [phone]  

## 2012-06-27 NOTE — Discharge Summary (Addendum)
Physician Discharge Summary  Jason Mcmahon ZOX:[id number] DOB: 07/20/1965 DOA: 06/24/2012  PCP: Colette Ribas, MD  Admit date: 06/24/2012 Discharge date: 06/27/2012  Time spent: 40 minutes  Recommendations for Outpatient Follow-up:  1. followup with primary care physician in one week  Discharge Diagnoses:  Principal Problem:  *Sepsis Active Problems:  UTI (lower urinary tract infection)  Tachycardia   Discharge Condition: stable  Diet recommendation: regular  Filed Weights   06/25/12 1330 06/26/12 0500 06/27/12 0512  Weight: 87.544 kg (193 lb) 87.816 kg (193 lb 9.6 oz) 87.5 kg (192 lb 14.4 oz)    History of present illness:  Jason Mcmahon is a 46 y.o. male who presents with c/o feeling like he is having difficulty urinating. Patient c/o pain and burning on urination onset yesterday. Also associated with nausea, headache, and poor appetite. Nothing seems to make these symptoms better nor worse. When he tries to go to the bathroom he has sensation of incomplete emptying.  In the ED patient was found to have a UTI, with associated sepsis 3/4 SIRS criteria including fever, tachycardia, and leukocytosis. Despite his feeling of incomplete bladder emptying a bladder scan revealed only 70cc of urine remaining after urination thus providing no evidence of retention. Hospitalist has been asked to admit the patient.   Hospital Course:    1. Sepsis: Apparent with tachycardia, fever or leukocytosis along with UTI. the Sepsis syndrome is likely secondary to UTI. At the time of admission patient was started empirically on Rocephin, both urine and blood cultures were done. Blood cultures stayed negative to date but urine showed pansensitive Escherichia coli. The antibiotic was switched to ciprofloxacin. Patient to complete a total 14 days of antibiotics.  2. UTI: As mentioned above the time of admission patient was presented with urinalysis consistent with UTI, empirically started on  Rocephin. The culture grew Escherichia coli. Ultrasound was done and showed no anatomical abnormalities. Please note while he was in the hospital patient developed some urinary retention and constipation likely secondary to narcotics.   3. Tachycardia secondary to his sepsis and pain. 12-lead EKG was done and showed sinus tachycardia, this was managed with aggressive hydration and pain medications. Patient was complaining about a lot of her rotation around his bladder area.  4. Urinary retention: Patient did have some problems with urinary retention while in the hospital, initially in and out cath was used, then coud catheter was placed. It was thought initially to be secondary to narcotic pain medications. On the day of discharge the catheter was discontinued and patient was not able to void for about 8 hours, I did call urology, spoke with Dr. Berneice Heinrich and he recommended to place a catheter back in his bladder and he will see him in the office early next week. Also Percocet was prescribed.  Procedures:  none  Consultations:  none  Discharge Exam: Filed Vitals:   06/26/12 0500 06/26/12 1340 06/26/12 2052 06/27/12 0512  BP: 155/94 146/94 136/82 137/97  Pulse: 100 88 92 101  Temp: 98.2 F (36.8 C) 98.7 F (37.1 C) 98.9 F (37.2 C) 97.9 F (36.6 C)  TempSrc:  Oral Oral Oral  Resp: 18 18 18 16   Height:      Weight: 87.816 kg (193 lb 9.6 oz)   87.5 kg (192 lb 14.4 oz)  SpO2: 93% 94% 92% 93%   General: Alert and awake, oriented x3, not in any acute distress. HEENT: anicteric sclera, pupils reactive to light and accommodation, EOMI CVS: S1-S2 clear,  no murmur rubs or gallops Chest: clear to auscultation bilaterally, no wheezing, rales or rhonchi Abdomen: soft nontender, nondistended, normal bowel sounds, no organomegaly Extremities: no cyanosis, clubbing or edema noted bilaterally Neuro: Cranial nerves II-XII intact, no focal neurological deficits  Discharge Instructions        Discharge Orders    Future Orders Please Complete By Expires   Increase activity slowly          Medication List     As of 06/27/2012  3:31 PM    TAKE these medications         ciprofloxacin 500 MG tablet   Commonly known as: CIPRO   Take 1 tablet (500 mg total) by mouth 2 (two) times daily.      febuxostat 40 MG tablet   Commonly known as: ULORIC   Take 80 mg by mouth daily.      oxyCODONE-acetaminophen 5-325 MG per tablet   Commonly known as: PERCOCET/ROXICET   Take 1 tablet by mouth every 4 (four) hours as needed for pain.        Follow-up Information    Follow up with Colette Ribas, MD. In 2 weeks.   Contact information:   [address] [place] [address] [phone]       Follow up with Sebastian Ache, MD. In 1 week.   Contact information:   [address] [place] [address] [phone]           The results of significant diagnostics from this hospitalization (including imaging, microbiology, ancillary and laboratory) are listed below for reference.    Significant Diagnostic Studies: US Renal  06/25/2012  *RADIOLOGY REPORT*  Clinical Data: UTI and sepsis.  RENAL/URINARY TRACT ULTRASOUND COMPLETE  Comparison:  07/10/2005  Findings:  Right Kidney:  10.19 cm in length.  Normal renal cortex.  No obstruction or mass.  Left Kidney:  11.2 cm in length.  Normal renal cortex without obstruction or mass.  Bladder:  Normal bladder without thickening or mass.  IMPRESSION: Normal   Original Report Authenticated By: Janeece Riggers, M.D.     Microbiology: Recent Results (from the past 240 hour(s))  URINE CULTURE     Status: Normal   Collection Time   06/24/12 10:45 PM      Component Value Range Status Comment   Specimen Description URINE, RANDOM   Final    Special Requests CX ADDED AT 2309 ON [id number]   Final    Culture  Setup Time 06/25/2012 00:42   Final    Colony Count >=100,000 COLONIES/ML   Final    Culture  ESCHERICHIA COLI   Final    Report Status 06/26/2012 FINAL   Final    Organism ID, Bacteria ESCHERICHIA COLI   Final   CULTURE, BLOOD (ROUTINE X 2)     Status: Normal (Preliminary result)   Collection Time   06/25/12 12:30 AM      Component Value Range Status Comment   Specimen Description BLOOD RIGHT ARM   Final    Special Requests BOTTLES DRAWN AEROBIC AND ANAEROBIC 10CC EACH   Final    Culture  Setup Time 06/25/2012 04:15   Final    Culture     Final    Value:        BLOOD CULTURE RECEIVED NO GROWTH TO DATE CULTURE WILL BE HELD FOR 5 DAYS BEFORE ISSUING A FINAL NEGATIVE REPORT   Report Status PENDING   Incomplete  CULTURE, BLOOD (ROUTINE X 2)     Status: Normal (Preliminary result)   Collection Time   06/25/12 12:32 AM      Component Value Range Status Comment   Specimen Description BLOOD RIGHT HAND   Final    Special Requests BOTTLES DRAWN AEROBIC ONLY 8CC   Final    Culture  Setup Time 06/25/2012 04:15   Final    Culture     Final    Value:        BLOOD CULTURE RECEIVED NO GROWTH TO DATE CULTURE WILL BE HELD FOR 5 DAYS BEFORE ISSUING A FINAL NEGATIVE REPORT   Report Status PENDING   Incomplete      Labs: Basic Metabolic Panel:  Lab 06/27/12 1610 06/26/12 0615 06/25/12 0425 06/25/12 0424 06/24/12 2318  NA 137 138 141 -- 136  K 3.5 3.8 3.8 -- 3.5  CL 101 104 107 -- 100  CO2 22 23 20  -- 22  GLUCOSE 90 94 126* -- 125*  BUN 9 10 11  -- 13  CREATININE 1.07 0.99 1.06 1.06 1.17  CALCIUM 9.4 9.0 9.5 -- 9.6  MG -- -- -- -- --  PHOS -- -- -- -- --   Liver Function Tests:  Lab 06/26/12 0615 06/24/12 2318  AST 20 41*  ALT 43 67*  ALKPHOS 72 76  BILITOT 1.6* 1.5*  PROT 7.1 7.8  ALBUMIN 3.5 4.1   No results found for this basename: LIPASE:5,AMYLASE:5 in the last 168 hours No results found for this basename: AMMONIA:5 in the last 168 hours CBC:  Lab 06/26/12 0615 06/25/12 0425 06/25/12 0424 06/24/12 2318  WBC 8.8 16.4* 16.2* 12.5*  NEUTROABS -- -- -- 10.5*  HGB 12.9*  15.4 15.1 15.7  HCT 36.9* 42.9 42.1 43.6  MCV 92.3 90.3 90.3 90.3  PLT 114* 152 150 129*   Cardiac Enzymes: No results found for this basename: CKTOTAL:5,CKMB:5,CKMBINDEX:5,TROPONINI:5 in the last 168 hours BNP: BNP (last 3 results) No results found for this basename: PROBNP:3 in the last 8760 hours CBG:  Lab 06/27/12 0733 06/26/12 0808 06/25/12 0723  GLUCAP 92 87 127*       Signed:  Argel Pablo A  [facility] 06/27/2012, 3:31 PM

## 2012-07-01 LAB — CULTURE, BLOOD (ROUTINE X 2)
Culture: NO GROWTH
Culture: NO GROWTH

## 2012-08-16 ENCOUNTER — Other Ambulatory Visit: Payer: Self-pay

## 2013-05-07 ENCOUNTER — Other Ambulatory Visit: Payer: Self-pay

## 2015-02-07 ENCOUNTER — Other Ambulatory Visit (HOSPITAL_COMMUNITY): Payer: Self-pay | Admitting: Nephrology

## 2015-02-07 DIAGNOSIS — N183 Chronic kidney disease, stage 3 unspecified: Secondary | ICD-10-CM

## 2015-03-10 ENCOUNTER — Other Ambulatory Visit (HOSPITAL_COMMUNITY): Payer: Self-pay

## 2015-03-14 ENCOUNTER — Ambulatory Visit (HOSPITAL_COMMUNITY)
Admission: RE | Admit: 2015-03-14 | Discharge: 2015-03-14 | Disposition: A | Discharge status: Home / Self Care | Payer: 59 | Source: Ambulatory Visit | Attending: Nephrology | Admitting: Nephrology

## 2015-03-14 DIAGNOSIS — N183 Chronic kidney disease, stage 3 unspecified: Secondary | ICD-10-CM

## 2016-02-23 ENCOUNTER — Encounter: Payer: Self-pay | Admitting: Orthopaedic Surgery

## 2016-02-23 ENCOUNTER — Ambulatory Visit (INDEPENDENT_AMBULATORY_CARE_PROVIDER_SITE_OTHER): Payer: 59 | Admitting: Orthopaedic Surgery

## 2016-02-23 VITALS — BP 170/106 | HR 73 | Temp 97.5°F | Ht 69.0 in | Wt 184.0 lb

## 2016-02-23 DIAGNOSIS — M109 Gout, unspecified: Secondary | ICD-10-CM

## 2016-02-23 DIAGNOSIS — M10032 Idiopathic gout, left wrist: Secondary | ICD-10-CM

## 2016-02-23 MED ORDER — COLCHICINE 0.6 MG PO TABS: ORAL_TABLET | ORAL | 3 refills | Status: DC

## 2016-02-23 NOTE — Progress Notes (Signed)
Subjective: I have gout in my left wrist    Patient ID: Jason Mcmahon, male    DOB: August 30, 1965, 50 y.o.   MRN: [id number]  HPI Patient has long history of gout.  I saw him about ten years ago and made the diagnosis then.  He is on Uloric 40 mgm now.  I have not seen him in many years.  He went to [facility] several weeks ago and started having pain in the left wrist right before he left.  His left wrist got red, swollen and painful.  He was seen by Dr.Golding and given injection of prednisone which helped. He is better but still has some swelling and pain in the wrist.  It is not red like it was.  He has pain with motion.  He is continuing his Uloric.  I will give him Uloric 80 samples.  I have written Rx for colchicine.  I will see him back in a week.  I would like for him to get serum uric acid levels done today.   Review of Systems  HENT: Negative for congestion.   Respiratory: Negative for cough and shortness of breath.   Cardiovascular: Negative for chest pain and leg swelling.  Endocrine: Negative for cold intolerance.  Musculoskeletal: Positive for arthralgias.  Allergic/Immunologic: Negative for environmental allergies.   Past Medical History:  Diagnosis Date  . Gout     Past Surgical History:  Procedure Laterality Date  . arm surgery      Current Outpatient Prescriptions on File Prior to Visit  Medication Sig Dispense Refill  . febuxostat (ULORIC) 40 MG tablet Take 80 mg by mouth daily.     No current facility-administered medications on file prior to visit.     Social History   Social History  . Marital status: Married    Spouse name: N/A  . Number of children: N/A  . Years of education: N/A   Occupational History  . Not on file.   Social History Main Topics  . Smoking status: Never Smoker  . Smokeless tobacco: Never Used  . Alcohol use No  . Drug use: No  . Sexual activity: Not on file   Other Topics Concern  . Not on file   Social History  Narrative  . No narrative on file    Family History  Problem Relation Age of Onset  . Liver disease Father   . Hypertension Father   . Cancer Father   . Seizures Paternal Aunt   . Diabetes Paternal Grandmother     BP (!) 170/106   Pulse 73   Temp 97.5 F (36.4 C)   Ht 5\' 9"  (1.753 m)   Wt 184 lb (83.5 kg)   BMI 27.17 kg/m      Objective:   Physical Exam  Constitutional: He is oriented to person, place, and time. He appears well-developed and well-nourished.  HENT:  Head: Normocephalic and atraumatic.  Eyes: Conjunctivae and EOM are normal. Pupils are equal, round, and reactive to light.  Neck: Normal range of motion. Neck supple.  Cardiovascular: Normal rate, regular rhythm and intact distal pulses.   Pulmonary/Chest: Effort normal.  Abdominal: Soft.  Musculoskeletal: He exhibits tenderness (His left wrist has some swelling and pain, more pain dorsally.  He has no redness.  ROM is nearly full but very tender.  Grip is decreased.  Vaughn intact.  Elbow and shoulder negative.  Right wrist negative.).  Neurological: He is alert and oriented to person, place,  and time. He has normal reflexes. He displays normal reflexes. No cranial nerve deficit. He exhibits normal muscle tone. Coordination normal.  Skin: Skin is warm and dry.  Psychiatric: He has a normal mood and affect. His behavior is normal. Judgment and thought content normal.          Assessment & Plan:   Encounter Diagnoses  Name Primary?  . Acute idiopathic gout of left wrist   . Gout of left wrist, unspecified cause, unspecified chronicity Yes   Serum uric acid to be draw today.  Rx for colchicine given.  Samples of Uloric 80 given.  Return in one week  Call if any problem.  ADDENDUM:  Later in the afternoon I got word from his pharmacy that his insurance company needed prior approval for colchicine.  I called and talked to the representative.  I was told a decision would be made in 48 - 96 hours, business  days only.  I asked that it be given urgent review.  I was told that request would be made.  Electronically Signed Sanjuana Kava, MD 8/24/20178:06 PM

## 2016-02-23 NOTE — H&P (Signed)
  NTS SOAP Note  Vital Signs:  Vitals as of: [id number]: Systolic [id number]: Diastolic 95: Heart Rate 55: Temp 97.57F (Temporal): Height 60ft 9in: Weight 186Lbs 0 Ounces: BMI 27.47   BMI : 27.47 kg/m2  Subjective:  This 50 year old male presents for of need for screening TCS.  Had one many years ago for hemorrhoidal bleeding.  No family h/o colon carcinoma.  No blood per rectum, abdominal pain, weight loss, diarrhea, constipation, change in bowel habits.  Review of Symptoms:  Constitutional:negative Head:negative Eyes:negative Nose/Mouth/Throat:negative Cardiovascular:negative Respiratory:negative Gastrointestinheartburn Genitourinary:negative Musculoskeletal:negative Skin:negative Hematolgic/Lymphatic:negative Allergic/Immunologic:negative   Past Medical History:Reviewed  Past Medical History  Surgical History: none Medical Problems: HTN Allergies: nkda Medications: lisinopril, uloric   Social History:Reviewed  Social History  Preferred Language: English Race:  Other Ethnicity: Not Hispanic / Latino Age: 71 year Marital Status:  M Alcohol: no   Smoking Status: Never smoker reviewed on 02/23/2016 Functional Status reviewed on 02/23/2016 ------------------------------------------------ Bathing: Normal Cooking: Normal Dressing: Normal Driving: Normal Eating: Normal Managing Meds: Normal Oral Care: Normal Shopping: Normal Toileting: Normal Transferring: Normal Walking: Normal Cognitive Status reviewed on 02/23/2016 ------------------------------------------------ Attention: Normal Decision Making: Normal Language: Normal Memory: Normal Motor: Normal Perception: Normal Problem Solving: Normal Visual and Spatial: Normal   Family History:Reviewed  Family Health History Mother, Living; Healthy;  Father, Deceased; Disease of liver;     Objective Information: General:Well appearing, well nourished in no  distress. Head:Atraumatic; no masses; no abnormalities Neck:Supple without lymphadenopathy.  Heart:RRR, no murmur or gallop.  Normal S1, S2.  No S3, S4.  Lungs:CTA bilaterally, no wheezes, rhonchi, rales.  Breathing unlabored. Abdomen:Soft, NT/ND, normal bowel sounds, no HSM, no masses.  No peritoneal signs. deferred to procedure  Assessment:Need for screening TCS  Diagnoses: V76.51  Z12.11 Screening for malignant neoplasm of colon (Encounter for screening for malignant neoplasm of colon)  Procedures: [id number] - OFFICE OUTPATIENT NEW 30 MINUTES    Plan:  Scheduled for TCS on 02/28/16.  Trilyte prescribed.   Patient Education:Alternative treatments to surgery were discussed with patient (and family).Risks and benefits  of procedure including bleeding and perforation were fully explained to the patient (and family) who gave informed consent. Patient/family questions were addressed.  Follow-up:Pending Surgery

## 2016-02-28 ENCOUNTER — Encounter (HOSPITAL_COMMUNITY): Payer: Self-pay | Admitting: *Deleted

## 2016-02-28 ENCOUNTER — Encounter (HOSPITAL_COMMUNITY)
Admission: RE | Disposition: A | Discharge status: Home / Self Care | Payer: Self-pay | Source: Ambulatory Visit | Attending: General Surgery

## 2016-02-28 ENCOUNTER — Telehealth: Payer: Self-pay | Admitting: Orthopaedic Surgery

## 2016-02-28 ENCOUNTER — Telehealth: Payer: Self-pay | Admitting: *Deleted

## 2016-02-28 ENCOUNTER — Ambulatory Visit (HOSPITAL_COMMUNITY)
Admission: RE | Admit: 2016-02-28 | Discharge: 2016-02-28 | Disposition: A | Discharge status: Home / Self Care | Payer: 59 | Source: Ambulatory Visit | Attending: General Surgery | Admitting: General Surgery

## 2016-02-28 DIAGNOSIS — K5730 Diverticulosis of large intestine without perforation or abscess without bleeding: Secondary | ICD-10-CM | POA: Diagnosis not present

## 2016-02-28 DIAGNOSIS — Z1211 Encounter for screening for malignant neoplasm of colon: Secondary | ICD-10-CM | POA: Insufficient documentation

## 2016-02-28 DIAGNOSIS — I10 Essential (primary) hypertension: Secondary | ICD-10-CM | POA: Insufficient documentation

## 2016-02-28 DIAGNOSIS — D125 Benign neoplasm of sigmoid colon: Secondary | ICD-10-CM | POA: Insufficient documentation

## 2016-02-28 DIAGNOSIS — D122 Benign neoplasm of ascending colon: Secondary | ICD-10-CM | POA: Insufficient documentation

## 2016-02-28 HISTORY — PX: COLONOSCOPY: SHX5424

## 2016-02-28 HISTORY — DX: Essential (primary) hypertension: I10

## 2016-02-28 SURGERY — COLONOSCOPY
Anesthesia: Moderate Sedation

## 2016-02-28 MED ORDER — ONDANSETRON 4 MG PO TBDP
4.0000 mg | ORAL_TABLET | Freq: Once | ORAL | Status: AC
  Administered 2016-02-28: 4 mg via ORAL

## 2016-02-28 MED ORDER — MIDAZOLAM HCL 5 MG/5ML IJ SOLN
INTRAMUSCULAR | Status: DC | PRN
  Administered 2016-02-28: 1 mg via INTRAVENOUS
  Administered 2016-02-28: 3 mg via INTRAVENOUS

## 2016-02-28 MED ORDER — SODIUM CHLORIDE 0.9 % IV SOLN
INTRAVENOUS | Status: DC
  Administered 2016-02-28: 07:00:00 via INTRAVENOUS

## 2016-02-28 MED ORDER — STERILE WATER FOR IRRIGATION IR SOLN
Status: DC | PRN
  Administered 2016-02-28: 100 mL

## 2016-02-28 MED ORDER — ONDANSETRON 4 MG PO TBDP
ORAL_TABLET | ORAL | Status: AC
  Filled 2016-02-28: qty 1

## 2016-02-28 MED ORDER — MIDAZOLAM HCL 5 MG/5ML IJ SOLN
INTRAMUSCULAR | Status: AC
  Filled 2016-02-28: qty 10

## 2016-02-28 MED ORDER — MEPERIDINE HCL 50 MG/ML IJ SOLN
INTRAMUSCULAR | Status: DC | PRN
  Administered 2016-02-28: 50 mg via INTRAVENOUS

## 2016-02-28 MED ORDER — MEPERIDINE HCL 50 MG/ML IJ SOLN
INTRAMUSCULAR | Status: AC
  Filled 2016-02-28: qty 1

## 2016-02-28 MED ORDER — COLCHICINE 0.6 MG PO CAPS: 0.6000 mg | ORAL_CAPSULE | Freq: Three times a day (TID) | ORAL | 5 refills | Status: DC

## 2016-02-28 NOTE — Telephone Encounter (Signed)
Call received from [place] at [employer], ph# [phone], states has been trying to fax results. States has Dr Brooke Bonito contact information from [employer]" and was using previous phone and fax #'s.  I relayed updated practice and contact #'s; states will re-fax.  Said we will need to contact our "local sales representative" in order for our contact and account information to be updated.  Please advise.

## 2016-02-28 NOTE — Interval H&P Note (Signed)
History and Physical Interval Note:  02/28/2016 7:44 AM  Jason Mcmahon  has presented today for surgery, with the diagnosis of screening  The various methods of treatment have been discussed with the patient and family. After consideration of risks, benefits and other options for treatment, the patient has consented to  Procedure(s): COLONOSCOPY (N/A) as a surgical intervention .  The patient's history has been reviewed, patient examined, no change in status, stable for surgery.  I have reviewed the patient's chart and labs.  Questions were answered to the patient's satisfaction.     Aviva Signs A

## 2016-02-28 NOTE — Op Note (Signed)
[facility] Patient Name: Jason Mcmahon Procedure Date: 02/28/2016 7:45 AM MRN: [id number] Date of Birth: 02/22/1966 Attending MD: Aviva Signs , MD CSN: [id number] Age: 50 Admit Type: Outpatient Procedure:                Colonoscopy Indications:              Screening for colorectal malignant neoplasm Providers:                Aviva Signs, MD, Tammy Vaught, RN, Purcell Nails. [place],                            [employer] officer Referring MD:              Medicines:                Midazolam 4 mg IV, Meperidine 50 mg IV Complications:            No immediate complications. Estimated Blood Loss:     Estimated blood loss: none. Procedure:                Pre-Anesthesia Assessment:                           - Prior to the procedure, a History and Physical                            was performed, and patient medications and                            allergies were reviewed. The patient is competent.                            The risks and benefits of the procedure and the                            sedation options and risks were discussed with the                            patient. All questions were answered and informed                            consent was obtained. Patient identification and                            proposed procedure were verified by the nurse in                            the procedure room. Mental Status Examination:                            alert and oriented. Airway Examination: normal                            oropharyngeal airway and neck mobility. Respiratory  Examination: clear to auscultation. CV Examination:                            RRR, no murmurs, no S3 or S4. Prophylactic                            Antibiotics: The patient does not require                            prophylactic antibiotics. Prior Anticoagulants: The                            patient has taken no previous anticoagulant or   antiplatelet agents. ASA Grade Assessment: I - A                            normal, healthy patient. After reviewing the risks                            and benefits, the patient was deemed in                            satisfactory condition to undergo the procedure.                            The anesthesia plan was to use moderate sedation /                            analgesia (conscious sedation). Immediately prior                            to administration of medications, the patient was                            re-assessed for adequacy to receive sedatives. The                            heart rate, respiratory rate, oxygen saturations,                            blood pressure, adequacy of pulmonary ventilation,                            and response to care were monitored throughout the                            procedure. The physical status of the patient was                            re-assessed after the procedure.                           After obtaining informed consent, the colonoscope  was passed under direct vision. Throughout the                            procedure, the patient's blood pressure, pulse, and                            oxygen saturations were monitored continuously. The                            [id number]) scope was introduced through                            the anus and advanced to the the cecum, identified                            by the appendiceal orifice, ileocecal valve and                            palpation. No anatomical landmarks were                            photographed. The colonoscopy was performed without                            difficulty. The patient tolerated the procedure                            well. The quality of the bowel preparation was                            adequate. Scope In: 7:52:49 AM Scope Out: 8:05:35 AM Scope Withdrawal Time: 0 hours 9 minutes 2 seconds  Total  Procedure Duration: 0 hours 12 minutes 46 seconds  Findings:      The perianal and digital rectal examinations were normal.      Multiple medium-mouthed diverticula were found in the sigmoid colon.      A 2 mm polyp was found in the proximal ascending colon. The polyp was       sessile. The polyp was removed with a hot snare. Resection and retrieval       were complete. Estimated blood loss: none.      A 8 mm polyp was found in the distal sigmoid colon. The polyp was       pedunculated. The polyp was removed with a hot snare. Resection and       retrieval were complete. Estimated blood loss: none.      The exam was otherwise without abnormality.      The retroflexed view of the distal rectum and anal verge was normal and       showed no anal or rectal abnormalities. Estimated blood loss: none.       Estimated blood loss: none.      The exam was otherwise without abnormality. Impression:               - Mild diverticulosis in the sigmoid colon.                           -  One 2 mm polyp in the proximal ascending colon,                            removed with a hot snare. Resected and retrieved.                           - One 8 mm polyp in the distal sigmoid colon,                            removed with a hot snare. Resected and retrieved.                           - The examination was otherwise normal.                           - The distal rectum and anal verge are normal on                            retroflexion view.                           - The examination was otherwise normal. Moderate Sedation:      Moderate (conscious) sedation was administered by the endoscopy nurse       and supervised by the endoscopist. The following parameters were       monitored: oxygen saturation, heart rate, blood pressure, and response       to care. Total physician intraservice time was 15 minutes. Recommendation:           - Written discharge instructions were provided to                             the patient.                           - Return to normal activities tomorrow.                           - Resume previous diet.                           - Continue present medications.                           - Await pathology results.                           - Repeat colonoscopy in 3 years for surveillance. Procedure Code(s):        --- Professional ---                           [id number], Colonoscopy, flexible; with removal of                            tumor(s), polyp(s), or other lesion(s) by snare  technique                           [id number], Moderate sedation services provided by the                            same physician or other qualified health care                            professional performing the diagnostic or                            therapeutic service that the sedation supports,                            requiring the presence of an independent trained                            observer to assist in the monitoring of the                            patient's level of consciousness and physiological                            status; initial 15 minutes of intraservice time,                            patient age 59 years or older Diagnosis Code(s):        --- Professional ---                           Z12.11, Encounter for screening for malignant                            neoplasm of colon                           D12.2, Benign neoplasm of ascending colon                           D12.5, Benign neoplasm of sigmoid colon                           K57.30, Diverticulosis of large intestine without                            perforation or abscess without bleeding CPT copyright 2016 American Medical Association. All rights reserved. The codes documented in this report are preliminary and upon coder review may  be revised to meet current compliance requirements. Aviva Signs, MD Aviva Signs, MD 02/28/2016 8:18:18 AM This report has been  signed electronically. Number of Addenda: 0

## 2016-02-28 NOTE — Discharge Instructions (Signed)
Colon Polyps Polyps are lumps of extra tissue growing inside the body. Polyps can grow in the large intestine (colon). Most colon polyps are noncancerous (benign). However, some colon polyps can become cancerous over time. Polyps that are larger than a pea may be harmful. To be safe, caregivers remove and test all polyps. CAUSES  Polyps form when mutations in the genes cause your cells to grow and divide even though no more tissue is needed. RISK FACTORS There are a number of risk factors that can increase your chances of getting colon polyps. They include:  Being older than 50 years.  Family history of colon polyps or colon cancer.  Long-term colon diseases, such as colitis or Crohn disease.  Being overweight.  Smoking.  Being inactive.  Drinking too much alcohol. SYMPTOMS  Most small polyps do not cause symptoms. If symptoms are present, they may include:  Blood in the stool. The stool may look dark red or black.  Constipation or diarrhea that lasts longer than 1 week. DIAGNOSIS People often do not know they have polyps until their caregiver finds them during a regular checkup. Your caregiver can use 4 tests to check for polyps:  Digital rectal exam. The caregiver wears gloves and feels inside the rectum. This test would find polyps only in the rectum.  Barium enema. The caregiver puts a liquid called barium into your rectum before taking X-rays of your colon. Barium makes your colon look white. Polyps are dark, so they are easy to see in the X-ray pictures.  Sigmoidoscopy. A thin, flexible tube (sigmoidoscope) is placed into your rectum. The sigmoidoscope has a light and tiny camera in it. The caregiver uses the sigmoidoscope to look at the last third of your colon.  Colonoscopy. This test is like sigmoidoscopy, but the caregiver looks at the entire colon. This is the most common method for finding and removing polyps. TREATMENT  Any polyps will be removed during a  sigmoidoscopy or colonoscopy. The polyps are then tested for cancer. PREVENTION  To help lower your risk of getting more colon polyps:  Eat plenty of fruits and vegetables. Avoid eating fatty foods.  Do not smoke.  Avoid drinking alcohol.  Exercise every day.  Lose weight if recommended by your caregiver.  Eat plenty of calcium and folate. Foods that are rich in calcium include milk, cheese, and broccoli. Foods that are rich in folate include chickpeas, kidney beans, and spinach. HOME CARE INSTRUCTIONS Keep all follow-up appointments as directed by your caregiver. You may need periodic exams to check for polyps. SEEK MEDICAL CARE IF: You notice bleeding during a bowel movement.   This information is not intended to replace advice given to you by your health care provider. Make sure you discuss any questions you have with your health care provider.   Document Released: 03/14/2004 Document Revised: 07/09/2014 Document Reviewed: 08/28/2011 Elsevier Interactive Patient Education 2016 Elsevier Inc. Colonoscopy, Care After Refer to this sheet in the next few weeks. These instructions provide you with information on caring for yourself after your procedure. Your health care provider may also give you more specific instructions. Your treatment has been planned according to current medical practices, but problems sometimes occur. Call your health care provider if you have any problems or questions after your procedure. WHAT TO EXPECT AFTER THE PROCEDURE  After your procedure, it is typical to have the following:  A small amount of blood in your stool.  Moderate amounts of gas and mild abdominal cramping or bloating.  HOME CARE INSTRUCTIONS  Do not drive, operate machinery, or sign important documents for 24 hours.  You may shower and resume your regular physical activities, but move at a slower pace for the first 24 hours.  Take frequent rest periods for the first 24 hours.  Walk  around or put a warm pack on your abdomen to help reduce abdominal cramping and bloating.  Drink enough fluids to keep your urine clear or pale yellow.  You may resume your normal diet as instructed by your health care provider. Avoid heavy or fried foods that are hard to digest.  Avoid drinking alcohol for 24 hours or as instructed by your health care provider.  Only take over-the-counter or prescription medicines as directed by your health care provider.  If a tissue sample (biopsy) was taken during your procedure:  Do not take aspirin or blood thinners for 7 days, or as instructed by your health care provider.  Do not drink alcohol for 7 days, or as instructed by your health care provider.  Eat soft foods for the first 24 hours. SEEK MEDICAL CARE IF: You have persistent spotting of blood in your stool 2-3 days after the procedure. SEEK IMMEDIATE MEDICAL CARE IF:  You have more than a small spotting of blood in your stool.  You pass large blood clots in your stool.  Your abdomen is swollen (distended).  You have nausea or vomiting.  You have a fever.  You have increasing abdominal pain that is not relieved with medicine.   This information is not intended to replace advice given to you by your health care provider. Make sure you discuss any questions you have with your health care provider.   Document Released: 01/31/2004 Document Revised: 04/08/2013 Document Reviewed: 02/23/2013 Elsevier Interactive Patient Education [employer].

## 2016-02-28 NOTE — Telephone Encounter (Signed)
Patient request refill on Colchicine 0.6 mg [employer]

## 2016-02-29 NOTE — Telephone Encounter (Signed)
I called and left a message for Rodena Piety to call me regarding this.

## 2016-03-01 ENCOUNTER — Ambulatory Visit (INDEPENDENT_AMBULATORY_CARE_PROVIDER_SITE_OTHER): Payer: 59

## 2016-03-01 ENCOUNTER — Encounter (HOSPITAL_COMMUNITY): Payer: Self-pay | Admitting: General Surgery

## 2016-03-01 ENCOUNTER — Encounter: Payer: Self-pay | Admitting: Orthopaedic Surgery

## 2016-03-01 ENCOUNTER — Ambulatory Visit (INDEPENDENT_AMBULATORY_CARE_PROVIDER_SITE_OTHER): Payer: 59 | Admitting: Orthopaedic Surgery

## 2016-03-01 VITALS — BP 213/87 | HR 62 | Ht 69.0 in | Wt 187.0 lb

## 2016-03-01 DIAGNOSIS — M10032 Idiopathic gout, left wrist: Secondary | ICD-10-CM

## 2016-03-01 DIAGNOSIS — M25532 Pain in left wrist: Secondary | ICD-10-CM | POA: Diagnosis not present

## 2016-03-01 NOTE — Progress Notes (Signed)
Patient [id number] Jason Mcmahon, male DOB:Sep 15, 1965, 50 y.o. [id number]  Chief Complaint  Patient presents with  . Follow-up    gout left wrist    HPI  Jason Mcmahon is a 50 y.o. male who has gout of the left wrist.  I had given samples of Uloric 80 last week and also wrote Rx for colchicine.  I got a denial from pharmacy for the colchicine late last Thursday.  I called his insurance company for an appeal.  I spent a good hour on the phone getting through and talking to them.  I thought it was approved after the conversation.  On coming back to the office Tuesday, I had a fax that it was denied again.  I called early Tuesday morning and found that the pharmacy had requested tablets and the insurance company only pays for capsules of colchicine.  I made the change and called the pharmacy.  Tuesday afternoon, I was faxed another denial.  After going through the long phone tree and another long wait, I discovered the pharmacy did not have the capsules so the Rx was denied.  I then called the drug store and had them order the medicine.  It arrived late Wednesday afternoon and he began it last night.  It is too early to see the effect.  He has less pain.  He has been taking the 80 Uloric.  I will give new sample.  He has no redness today and has much better motion.  Serum uric acid was 6.1, high normal or just over threshold of 6 for treatment with Uloric.  He may need to stay on the higher dose.  HPI  Body mass index is 27.62 kg/m.  ROS  Review of Systems  HENT: Negative for congestion.   Respiratory: Negative for cough and shortness of breath.   Cardiovascular: Negative for chest pain and leg swelling.  Endocrine: Negative for cold intolerance.  Musculoskeletal: Positive for arthralgias.  Allergic/Immunologic: Negative for environmental allergies.    Past Medical History:  Diagnosis Date  . Gout   . Hypertension     Past Surgical History:  Procedure Laterality Date  . arm  surgery      Family History  Problem Relation Age of Onset  . Liver disease Father   . Hypertension Father   . Cancer Father   . Seizures Paternal Aunt   . Diabetes Paternal Grandmother     Social History Social History  Substance Use Topics  . Smoking status: Former Smoker    Packs/day: 3.00    Years: 18.00    Types: Cigarettes  . Smokeless tobacco: Never Used  . Alcohol use No    No Known Allergies  Current Outpatient Prescriptions  Medication Sig Dispense Refill  . Colchicine (MITIGARE) 0.6 MG CAPS Take 0.6 mg by mouth 3 (three) times daily. 15 capsule 5  . febuxostat (ULORIC) 40 MG tablet Take 80 mg by mouth daily.    Marland Kitchen lisinopril (PRINIVIL,ZESTRIL) 10 MG tablet Take 10 mg by mouth daily.     No current facility-administered medications for this visit.      Physical Exam  Blood pressure (!) 213/87, pulse 62, height 5\' 9"  (1.753 m), weight 187 lb (84.8 kg).  Constitutional: overall normal hygiene, normal nutrition, well developed, normal grooming, normal body habitus. Assistive device:none  Musculoskeletal: gait and station Limp none, muscle tone and strength are normal, no tremors or atrophy is present.  .  Neurological: coordination overall normal.  Deep tendon reflex/nerve stretch  intact.  Sensation normal.  Cranial nerves II-XII intact.   Skin:   normal overall no scars, lesions, ulcers or rashes. No psoriasis.  Psychiatric: Alert and oriented x 3.  Recent memory intact, remote memory unclear.  Normal mood and affect. Well groomed.  Good eye contact.  Cardiovascular: overall no swelling, no varicosities, no edema bilaterally, normal temperatures of the legs and arms, no clubbing, cyanosis and good capillary refill.  Lymphatic: palpation is normal.  The left wrist has slight swelling but good motion and no redness.  NV intact.  X-rays were taken of the left wrist, reported separately.   The patient has been educated about the nature of the problem(s)  and counseled on treatment options.  The patient appeared to understand what I have discussed and is in agreement with it.  Encounter Diagnoses  Name Primary?  . Left wrist pain Yes  . Acute idiopathic gout of left wrist     PLAN Call if any problems.  Precautions discussed.  Continue current medications.   Return to clinic 1 week   New samples of Uloric 80 given.  Electronically Signed Sanjuana Kava, MD 8/31/20179:11 AM

## 2016-03-06 ENCOUNTER — Telehealth: Payer: Self-pay | Admitting: Orthopaedic Surgery

## 2016-03-06 NOTE — Telephone Encounter (Signed)
Patient's wife called to say Jason Mcmahon is having diarrhea and real bad stomach pains since he has started taking the Colchicine for the last few days. He just wanted to be sure you were aware.

## 2016-03-06 NOTE — Telephone Encounter (Signed)
Tell him to stop it

## 2016-03-08 ENCOUNTER — Encounter: Payer: Self-pay | Admitting: Orthopaedic Surgery

## 2016-03-08 ENCOUNTER — Ambulatory Visit (INDEPENDENT_AMBULATORY_CARE_PROVIDER_SITE_OTHER): Payer: 59 | Admitting: Orthopaedic Surgery

## 2016-03-08 VITALS — BP 127/86 | HR 86 | Temp 97.5°F | Ht 69.0 in | Wt 185.0 lb

## 2016-03-08 DIAGNOSIS — M25532 Pain in left wrist: Secondary | ICD-10-CM | POA: Diagnosis not present

## 2016-03-08 DIAGNOSIS — M10032 Idiopathic gout, left wrist: Secondary | ICD-10-CM | POA: Diagnosis not present

## 2016-03-08 NOTE — Progress Notes (Signed)
Patient [id number] Jason Mcmahon, male DOB:04-18-1966, 50 y.o. [id number]  Chief Complaint  Patient presents with  . Follow-up    left wrist    HPI  Jason Mcmahon is a 50 y.o. male who has left wrist gout pain.  He is improved.  I have given him Uloric 80 over the last two visits.  He tried the colchicine and he got sick.  He is much better now.  He has no pain or redness.  I will give another sample of Uloric 80 and see him as needed.  He is to continue Uloric daily. HPI  Body mass index is 27.32 kg/m.  ROS  Review of Systems  HENT: Negative for congestion.   Respiratory: Negative for cough and shortness of breath.   Cardiovascular: Negative for chest pain and leg swelling.  Endocrine: Negative for cold intolerance.  Musculoskeletal: Positive for arthralgias.  Allergic/Immunologic: Negative for environmental allergies.    Past Medical History:  Diagnosis Date  . Gout   . Hypertension     Past Surgical History:  Procedure Laterality Date  . arm surgery    . COLONOSCOPY N/A 02/28/2016   Procedure: COLONOSCOPY;  Surgeon: Aviva Signs, MD;  Location: [facility];  Service: Gastroenterology;  Laterality: N/A;    Family History  Problem Relation Age of Onset  . Liver disease Father   . Hypertension Father   . Cancer Father   . Seizures Paternal Aunt   . Diabetes Paternal Grandmother     Social History Social History  Substance Use Topics  . Smoking status: Former Smoker    Packs/day: 3.00    Years: 18.00    Types: Cigarettes  . Smokeless tobacco: Never Used  . Alcohol use No    No Known Allergies  Current Outpatient Prescriptions  Medication Sig Dispense Refill  . Colchicine (MITIGARE) 0.6 MG CAPS Take 0.6 mg by mouth 3 (three) times daily. 15 capsule 5  . febuxostat (ULORIC) 40 MG tablet Take 80 mg by mouth daily.    Marland Kitchen lisinopril (PRINIVIL,ZESTRIL) 10 MG tablet Take 10 mg by mouth daily.     No current facility-administered medications for this visit.       Physical Exam  Blood pressure 127/86, pulse 86, temperature 97.5 F (36.4 C), height 5\' 9"  (1.753 m), weight 185 lb (83.9 kg).  Constitutional: overall normal hygiene, normal nutrition, well developed, normal grooming, normal body habitus. Assistive device:none  Musculoskeletal: gait and station Limp none, muscle tone and strength are normal, no tremors or atrophy is present.  .  Neurological: coordination overall normal.  Deep tendon reflex/nerve stretch intact.  Sensation normal.  Cranial nerves II-XII intact.   Skin:   Normal overall no scars, lesions, ulcers or rashes. No psoriasis.  Psychiatric: Alert and oriented x 3.  Recent memory intact, remote memory unclear.  Normal mood and affect. Well groomed.  Good eye contact.  Cardiovascular: overall no swelling, no varicosities, no edema bilaterally, normal temperatures of the legs and arms, no clubbing, cyanosis and good capillary refill.  Lymphatic: palpation is normal.  His left wrist has no swelling, no redness and full ROM.  NV intact.  The patient has been educated about the nature of the problem(s) and counseled on treatment options.  The patient appeared to understand what I have discussed and is in agreement with it.  Encounter Diagnoses  Name Primary?  . Left wrist pain Yes  . Acute idiopathic gout of left wrist     PLAN Call if  any problems.  Precautions discussed.  Continue current medications.   Return to clinic prn   Electronically Signed Sanjuana Kava, MD 9/7/20173:01 PM

## 2016-05-10 ENCOUNTER — Telehealth: Payer: Self-pay | Admitting: Orthopaedic Surgery

## 2016-05-10 MED ORDER — FEBUXOSTAT 40 MG PO TABS: 80.0000 mg | ORAL_TABLET | Freq: Every day | ORAL | 5 refills | Status: DC

## 2016-05-10 NOTE — Telephone Encounter (Signed)
Patient requests refill on medication  febuxostat (ULORIC) 40 MG tablet       Sig - Route: Take 80 mg by mouth daily. - Oral   Class: [facility], [employer]

## 2016-06-12 ENCOUNTER — Telehealth: Payer: Self-pay | Admitting: Orthopaedic Surgery

## 2016-06-12 NOTE — Telephone Encounter (Signed)
Patient states that [employer], Linna Hoff, notified that the prescription insurance will not accept the dosage on medication:  febuxostat (ULORIC) 40 MG tablet 60 tablet 5 05/10/2016    Sig - Route: Take 2 tablets (80 mg total) by mouth daily. - Oral    - states patient takes only 1 tablet per day.  Please advise.  Best call back phone for patient is ([phone])#[phone].

## 2016-06-13 MED ORDER — FEBUXOSTAT 40 MG PO TABS: 80.0000 mg | ORAL_TABLET | Freq: Every day | ORAL | 5 refills | Status: DC

## 2016-08-14 ENCOUNTER — Telehealth: Payer: Self-pay | Admitting: Orthopaedic Surgery

## 2016-08-14 NOTE — Telephone Encounter (Signed)
Patient's wife called to let us know that her husband's Uloric prescription costed them $248.00. She called their insurance and was told that they would pay for Allopurinol and Probencid because these are cheaper.

## 2016-12-04 DIAGNOSIS — D509 Iron deficiency anemia, unspecified: Secondary | ICD-10-CM | POA: Diagnosis not present

## 2016-12-04 DIAGNOSIS — R809 Proteinuria, unspecified: Secondary | ICD-10-CM | POA: Diagnosis not present

## 2016-12-04 DIAGNOSIS — E559 Vitamin D deficiency, unspecified: Secondary | ICD-10-CM | POA: Diagnosis not present

## 2016-12-04 DIAGNOSIS — N183 Chronic kidney disease, stage 3 (moderate): Secondary | ICD-10-CM | POA: Diagnosis not present

## 2017-01-16 DIAGNOSIS — N182 Chronic kidney disease, stage 2 (mild): Secondary | ICD-10-CM | POA: Diagnosis not present

## 2017-01-16 DIAGNOSIS — I10 Essential (primary) hypertension: Secondary | ICD-10-CM | POA: Diagnosis not present

## 2017-01-16 DIAGNOSIS — R809 Proteinuria, unspecified: Secondary | ICD-10-CM | POA: Diagnosis not present

## 2017-03-27 DIAGNOSIS — N183 Chronic kidney disease, stage 3 (moderate): Secondary | ICD-10-CM | POA: Diagnosis not present

## 2017-03-27 DIAGNOSIS — M109 Gout, unspecified: Secondary | ICD-10-CM | POA: Diagnosis not present

## 2017-03-27 DIAGNOSIS — I10 Essential (primary) hypertension: Secondary | ICD-10-CM | POA: Diagnosis not present

## 2017-08-07 ENCOUNTER — Telehealth: Payer: Self-pay | Admitting: Radiology

## 2017-08-07 ENCOUNTER — Telehealth: Payer: Self-pay | Admitting: Orthopaedic Surgery

## 2017-08-07 MED ORDER — FEBUXOSTAT 40 MG PO TABS: 80.0000 mg | ORAL_TABLET | Freq: Every day | ORAL | 5 refills | Status: DC

## 2017-08-07 NOTE — Telephone Encounter (Signed)
Pharmacy request received via fax from [facility], [place], for:  febuxostat (ULORIC) 40 MG tablet 60 tablet

## 2017-08-07 NOTE — Telephone Encounter (Signed)
I received a fax from [place] from [place] for [employer].  The form was downloaded from [facility], filled out and faxed to them on 08/07/17

## 2017-08-08 MED ORDER — FEBUXOSTAT 40 MG PO TABS: 80.0000 mg | ORAL_TABLET | Freq: Every day | ORAL | 5 refills | Status: DC

## 2017-08-13 ENCOUNTER — Telehealth: Payer: Self-pay | Admitting: Orthopaedic Surgery

## 2017-08-13 NOTE — Telephone Encounter (Signed)
Patient needs Uloric 80 mgs.  Qty  30       Sig: Take 1 tablets (80 mg total) by mouth daily.   This prescription needs  to go to [facility]

## 2017-08-14 MED ORDER — FEBUXOSTAT 80 MG PO TABS: 1.0000 | ORAL_TABLET | Freq: Every day | ORAL | 5 refills | Status: DC

## 2017-08-27 DIAGNOSIS — Z1389 Encounter for screening for other disorder: Secondary | ICD-10-CM | POA: Diagnosis not present

## 2017-08-27 DIAGNOSIS — J209 Acute bronchitis, unspecified: Secondary | ICD-10-CM | POA: Diagnosis not present

## 2017-09-03 ENCOUNTER — Telehealth: Payer: Self-pay | Admitting: Radiology

## 2017-09-03 ENCOUNTER — Other Ambulatory Visit: Payer: Self-pay | Admitting: Orthopedic Surgery

## 2017-09-03 MED ORDER — COLCHICINE 0.6 MG PO CAPS: 0.6000 mg | ORAL_CAPSULE | Freq: Three times a day (TID) | ORAL | 5 refills | Status: DC

## 2017-09-03 MED ORDER — ALLOPURINOL 300 MG PO TABS: 300.0000 mg | ORAL_TABLET | Freq: Every day | ORAL | 2 refills | Status: DC

## 2017-09-03 NOTE — Telephone Encounter (Signed)
Dr Luna Glasgow has prescribed Uloric for him, but his insurance will not cover They are asking for Allopurinol instead / [facility] [place]

## 2017-09-05 ENCOUNTER — Other Ambulatory Visit: Payer: Self-pay | Admitting: Orthopedic Surgery

## 2017-09-05 MED ORDER — ALLOPURINOL 300 MG PO TABS: 300.0000 mg | ORAL_TABLET | Freq: Every day | ORAL | 2 refills | Status: DC

## 2018-01-22 ENCOUNTER — Telehealth: Payer: Self-pay | Admitting: Orthopaedic Surgery

## 2018-01-22 NOTE — Telephone Encounter (Signed)
We received a request from [facility] for a drug change on this patient.  It was for Febuxostat ( Uloric) 80 mgs.for Dr. Luna Glasgow to make a change to Allopurinol.    I called Jason Mcmahon and told him that if he needed to get the medication from Dr. Luna Glasgow, he would need to make an appointment with him.  His last appointment with Dr. Luna Glasgow was in September 2017.  He said he was getting his medication from his PCP and did not want Dr. Luna Glasgow to do this for him.  I told him that was fine.

## 2018-01-31 ENCOUNTER — Telehealth: Payer: Self-pay | Admitting: Orthopaedic Surgery

## 2018-01-31 NOTE — Telephone Encounter (Signed)
Nico and his wife called this morning stating that when he got his last refill he was given generic for Uloric [employer]).  He states that he it does not help like the Uloric.  He spoke to [employer] and was told that since the doctor did not specify on the prescription, they are allowed to fill generic of this medicine.  I explained that there is no doctor in the office today and that Dr. Luna Glasgow will not return until Tuesday.  He is going to call his PCP and see if they can send in prescription for Uloric only for him..  I asked that they call me back and let me know what his PCP said.

## 2018-02-01 ENCOUNTER — Encounter (HOSPITAL_COMMUNITY): Payer: Self-pay | Admitting: Emergency Medicine

## 2018-02-01 ENCOUNTER — Other Ambulatory Visit: Payer: Self-pay

## 2018-02-01 DIAGNOSIS — I10 Essential (primary) hypertension: Secondary | ICD-10-CM | POA: Diagnosis not present

## 2018-02-01 DIAGNOSIS — R1032 Left lower quadrant pain: Secondary | ICD-10-CM | POA: Insufficient documentation

## 2018-02-01 DIAGNOSIS — R1031 Right lower quadrant pain: Secondary | ICD-10-CM | POA: Insufficient documentation

## 2018-02-01 DIAGNOSIS — R109 Unspecified abdominal pain: Secondary | ICD-10-CM | POA: Diagnosis present

## 2018-02-01 DIAGNOSIS — Z79899 Other long term (current) drug therapy: Secondary | ICD-10-CM | POA: Diagnosis not present

## 2018-02-01 DIAGNOSIS — K5792 Diverticulitis of intestine, part unspecified, without perforation or abscess without bleeding: Secondary | ICD-10-CM | POA: Diagnosis not present

## 2018-02-01 DIAGNOSIS — Z87891 Personal history of nicotine dependence: Secondary | ICD-10-CM | POA: Diagnosis not present

## 2018-02-01 NOTE — ED Triage Notes (Signed)
Pt C/O lower abdominal pain that started Wednesday. Pt denies N/V/D. Pt states it hurts worse with palpation.

## 2018-02-02 ENCOUNTER — Emergency Department (HOSPITAL_COMMUNITY): Payer: 59

## 2018-02-02 ENCOUNTER — Emergency Department (HOSPITAL_COMMUNITY)
Admission: EM | Admit: 2018-02-02 | Discharge: 2018-02-02 | Disposition: A | Discharge status: Home / Self Care | Payer: 59 | Attending: Emergency Medicine | Admitting: Emergency Medicine

## 2018-02-02 DIAGNOSIS — K5792 Diverticulitis of intestine, part unspecified, without perforation or abscess without bleeding: Secondary | ICD-10-CM

## 2018-02-02 DIAGNOSIS — R109 Unspecified abdominal pain: Secondary | ICD-10-CM | POA: Diagnosis not present

## 2018-02-02 LAB — COMPREHENSIVE METABOLIC PANEL
ALK PHOS: 82 U/L (ref 38–126)
ALT: 56 U/L — AB (ref 0–44)
AST: 42 U/L — ABNORMAL HIGH (ref 15–41)
Albumin: 4.1 g/dL (ref 3.5–5.0)
Anion gap: 6 (ref 5–15)
BUN: 15 mg/dL (ref 6–20)
CALCIUM: 9.2 mg/dL (ref 8.9–10.3)
CO2: 26 mmol/L (ref 22–32)
CREATININE: 1.22 mg/dL (ref 0.61–1.24)
Chloride: 106 mmol/L (ref 98–111)
Glucose, Bld: 98 mg/dL (ref 70–99)
Potassium: 3.4 mmol/L — ABNORMAL LOW (ref 3.5–5.1)
Sodium: 138 mmol/L (ref 135–145)
TOTAL PROTEIN: 7.9 g/dL (ref 6.5–8.1)
Total Bilirubin: 1.6 mg/dL — ABNORMAL HIGH (ref 0.3–1.2)

## 2018-02-02 LAB — CBC
HCT: 42.9 % (ref 39.0–52.0)
Hemoglobin: 15.1 g/dL (ref 13.0–17.0)
MCH: 32.9 pg (ref 26.0–34.0)
MCHC: 35.2 g/dL (ref 30.0–36.0)
MCV: 93.5 fL (ref 78.0–100.0)
Platelets: 122 10*3/uL — ABNORMAL LOW (ref 150–400)
RBC: 4.59 MIL/uL (ref 4.22–5.81)
RDW: 13.2 % (ref 11.5–15.5)
WBC: 5 10*3/uL (ref 4.0–10.5)

## 2018-02-02 LAB — LIPASE, BLOOD: LIPASE: 42 U/L (ref 11–51)

## 2018-02-02 MED ORDER — METRONIDAZOLE 500 MG PO TABS
500.0000 mg | ORAL_TABLET | Freq: Once | ORAL | Status: AC
  Administered 2018-02-02: 500 mg via ORAL
  Filled 2018-02-02: qty 1

## 2018-02-02 MED ORDER — METRONIDAZOLE 500 MG PO TABS: 500.0000 mg | ORAL_TABLET | Freq: Two times a day (BID) | ORAL | 0 refills | Status: DC

## 2018-02-02 MED ORDER — CIPROFLOXACIN HCL 500 MG PO TABS: 500.0000 mg | ORAL_TABLET | Freq: Two times a day (BID) | ORAL | 0 refills | Status: DC

## 2018-02-02 MED ORDER — HYDROCODONE-ACETAMINOPHEN 5-325 MG PO TABS: 1.0000 | ORAL_TABLET | Freq: Four times a day (QID) | ORAL | 0 refills | Status: DC | PRN

## 2018-02-02 MED ORDER — IOPAMIDOL (ISOVUE-300) INJECTION 61%
100.0000 mL | Freq: Once | INTRAVENOUS | Status: AC | PRN
  Administered 2018-02-02: 100 mL via INTRAVENOUS

## 2018-02-02 MED ORDER — SODIUM CHLORIDE 0.9 % IV SOLN
2.0000 g | Freq: Once | INTRAVENOUS | Status: AC
  Administered 2018-02-02: 2 g via INTRAVENOUS
  Filled 2018-02-02: qty 20

## 2018-02-02 MED ORDER — METRONIDAZOLE IN NACL 5-0.79 MG/ML-% IV SOLN
500.0000 mg | Freq: Once | INTRAVENOUS | Status: DC
  Filled 2018-02-02: qty 100

## 2018-02-02 NOTE — ED Provider Notes (Signed)
[facility] EMERGENCY DEPARTMENT Provider Note   CSN: [id number] Arrival date & time: 02/01/18  2141     History   Chief Complaint Chief Complaint  Patient presents with  . Abdominal Pain    HPI Jason Mcmahon is a 52 y.o. male.  The history is provided by the patient and the spouse.  Abdominal Pain   This is a new problem. The current episode started more than 2 days ago. The problem occurs daily. The problem has been gradually worsening. The pain is located in the RLQ and LLQ. The pain is moderate. Pertinent negatives include fever, diarrhea, nausea, vomiting, constipation and dysuria. The symptoms are aggravated by palpation. Nothing relieves the symptoms.   Reports onset of lower abdominal pain for 2 days ago.  No fever vomiting or diarrhea. He has never had this before.  Only recent change was in his gout medication. Past Medical History:  Diagnosis Date  . Gout   . Hypertension     Patient Active Problem List   Diagnosis Date Noted  . UTI (lower urinary tract infection) 06/25/2012  . Sepsis (Bruno) 06/25/2012  . Tachycardia 06/25/2012    Past Surgical History:  Procedure Laterality Date  . arm surgery    . COLONOSCOPY N/A 02/28/2016   Procedure: COLONOSCOPY;  Surgeon: Aviva Signs, MD;  [facility]: [facility];  Service: Gastroenterology;  Laterality: N/A;        Home Medications    Prior to Admission medications   Medication Sig Start Date End Date Taking? Authorizing Provider  allopurinol (ZYLOPRIM) 300 MG tablet Take 1 tablet (300 mg total) by mouth daily. 09/05/17   Carole Civil, MD  Colchicine (MITIGARE) 0.6 MG CAPS Take 0.6 mg by mouth 3 (three) times daily. 09/03/17   Carole Civil, MD  Febuxostat (ULORIC) 80 MG TABS Take 1 tablet (80 mg total) by mouth daily. 08/14/17   Sanjuana Kava, MD  lisinopril (PRINIVIL,ZESTRIL) 10 MG tablet Take 10 mg by mouth daily.    [provider]    Family History Family History  Problem Relation  Age of Onset  . Liver disease Father   . Hypertension Father   . Cancer Father   . Seizures Paternal Aunt   . Diabetes Paternal Grandmother     Social History Social History   Tobacco Use  . Smoking status: Former Smoker    Packs/day: 3.00    Years: 18.00    Pack years: 54.00    Types: Cigarettes  . Smokeless tobacco: Never Used  Substance Use Topics  . Alcohol use: No  . Drug use: No     Allergies   Patient has no known allergies.   Review of Systems Review of Systems  Constitutional: Negative for fever.  Gastrointestinal: Positive for abdominal pain. Negative for constipation, diarrhea, nausea and vomiting.  Genitourinary: Negative for dysuria and testicular pain.  All other systems reviewed and are negative.    Physical Exam Updated Vital Signs BP (!) 177/94   Pulse 67   Temp 98.3 F (36.8 C) (Oral)   Resp 17   Ht 1.727 m (5\' 8" )   Wt 87.5 kg (193 lb)   SpO2 98%   BMI 29.35 kg/m   Physical Exam CONSTITUTIONAL: Well developed/well nourished HEAD: Normocephalic/atraumatic EYES: EOMI/PERRL ENMT: Mucous membranes moist NECK: supple no meningeal signs SPINE/BACK:entire spine nontender CV: S1/S2 noted, no murmurs/rubs/gallops noted LUNGS: Lungs are clear to auscultation bilaterally, no apparent distress ABDOMEN: soft, moderate RLQ and LLQ tenderness, no  rebound or guarding, bowel sounds noted throughout abdomen GU:no cva tenderness NEURO: Pt is awake/alert/appropriate, moves all extremitiesx4.  No facial droop.   EXTREMITIES: pulses normal/equal, full ROM SKIN: warm, color normal PSYCH: no abnormalities of mood noted, alert and oriented to situation   ED Treatments / Results  Labs (all labs ordered are listed, but only abnormal results are displayed) Labs Reviewed  COMPREHENSIVE METABOLIC PANEL - Abnormal; Notable for the following components:      Result Value   Potassium 3.4 (*)    AST 42 (*)    ALT 56 (*)    Total Bilirubin 1.6 (*)    All  other components within normal limits  CBC - Abnormal; Notable for the following components:   Platelets 122 (*)    All other components within normal limits  LIPASE, BLOOD    EKG None  Radiology Ct Abdomen Pelvis W Contrast  Result Date: 02/02/2018 CLINICAL DATA:  Lower abdominal pain EXAM: CT ABDOMEN AND PELVIS WITH CONTRAST TECHNIQUE: Multidetector CT imaging of the abdomen and pelvis was performed using the standard protocol following bolus administration of intravenous contrast. CONTRAST:  173mL ISOVUE-300 IOPAMIDOL (ISOVUE-300) INJECTION 61% COMPARISON:  None. FINDINGS: Lower chest: Basilar atelectasis. Hepatobiliary: No focal abnormality within the liver parenchyma. Gallbladder is decompressed. No intrahepatic or extrahepatic biliary dilation. Pancreas: No focal mass lesion. No dilatation of the main duct. No intraparenchymal cyst. No peripancreatic edema. Spleen: No splenomegaly. No focal mass lesion. Adrenals/Urinary Tract: No adrenal nodule or mass. Kidneys unremarkable. No evidence for hydroureter. The urinary bladder appears normal for the degree of distention. Stomach/Bowel: Tiny hiatal hernia. Stomach otherwise unremarkable. Duodenum is normally positioned as is the ligament of Treitz. No small bowel wall thickening. No small bowel dilatation. The terminal ileum is normal. The appendix is normal. A few scattered diverticuli are seen in the left colon. A sigmoid diverticulum with thick walls and surrounding edema/inflammation is evident on axial image 68. No associated perforation or abscess. Vascular/Lymphatic: There is abdominal aortic atherosclerosis without aneurysm. There is no gastrohepatic or hepatoduodenal ligament lymphadenopathy. No intraperitoneal or retroperitoneal lymphadenopathy. No pelvic sidewall lymphadenopathy. Reproductive: Prostate gland appears mildly enlarged. Other: No intraperitoneal free fluid. Musculoskeletal: Small bilateral groin hernias contain only fat. No  worrisome lytic or sclerotic osseous abnormality. IMPRESSION: 1. Inflamed diverticulum in the mid sigmoid colon with surrounding edema/inflammation. Imaging features compatible with sigmoid diverticulitis. No perforation or abscess. 2.  Aortic Atherosclerois (ICD10-170.0) Electronically Signed   By: Misty Stanley M.D.   On: 02/02/2018 02:05    Procedures Procedures (including critical care time)  Medications Ordered in ED Medications  cefTRIAXone (ROCEPHIN) 2 g in sodium chloride 0.9 % 100 mL IVPB (2 g Intravenous New Bag/Given 02/02/18 0324)    And  metroNIDAZOLE (FLAGYL) IVPB 500 mg (has no administration in time range)  iopamidol (ISOVUE-300) 61 % injection 100 mL (100 mLs Intravenous Contrast Given 02/02/18 0114)     Initial Impression / Assessment and Plan / ED Course  I have reviewed the triage vital signs and the nursing notes.  Pertinent labs & imaging results that were available during my care of the patient were reviewed by me and considered in my medical decision making (see chart for details). Narcotic database reviewed and considered in decision making    1:41 AM Due to severity of pain, will obtain CT imaging.  Patient declines pain medicine 3:59 AM Patient improved, taking p.o.  CT findings reveal uncomplicated diverticulitis.  He is appropriate for discharge home.  We  discussed strict return precautions.  Advised he should follow-up for colonoscopy later this year. Final Clinical Impressions(s) / ED Diagnoses   Final diagnoses:  Diverticulitis    ED Discharge Orders        Ordered    HYDROcodone-acetaminophen (NORCO/VICODIN) 5-325 MG tablet  Every 6 hours PRN     02/02/18 0358    ciprofloxacin (CIPRO) 500 MG tablet  2 times daily     02/02/18 0358    metroNIDAZOLE (FLAGYL) 500 MG tablet  2 times daily     02/02/18 0358       Ripley Fraise, MD 02/02/18 [phone]

## 2018-02-02 NOTE — ED Notes (Signed)
Patient transported to CT 

## 2018-03-05 ENCOUNTER — Telehealth: Payer: Self-pay | Admitting: Orthopaedic Surgery

## 2018-03-05 NOTE — Telephone Encounter (Signed)
Received a faxed request for patient's Uloric be switched to Allopurinol. I called the patient and spoke with his wife letting her know that Jason Mcmahon and he would need an appointment before this could be done. I told her it was in the chart that Jason Mcmahon was going to get his PCP to prescribe this instead of Dr.Keeling. She stated ok.

## 2018-05-09 DIAGNOSIS — Z23 Encounter for immunization: Secondary | ICD-10-CM | POA: Diagnosis not present

## 2018-05-28 DIAGNOSIS — Z6828 Body mass index (BMI) 28.0-28.9, adult: Secondary | ICD-10-CM | POA: Diagnosis not present

## 2018-05-28 DIAGNOSIS — I10 Essential (primary) hypertension: Secondary | ICD-10-CM | POA: Diagnosis not present

## 2018-05-28 DIAGNOSIS — E663 Overweight: Secondary | ICD-10-CM | POA: Diagnosis not present

## 2018-05-28 DIAGNOSIS — Z1389 Encounter for screening for other disorder: Secondary | ICD-10-CM | POA: Diagnosis not present

## 2018-05-28 DIAGNOSIS — Z0000 Encounter for general adult medical examination without abnormal findings: Secondary | ICD-10-CM | POA: Diagnosis not present

## 2018-06-10 ENCOUNTER — Other Ambulatory Visit: Payer: Self-pay | Admitting: Orthopaedic Surgery

## 2018-06-10 NOTE — Telephone Encounter (Signed)
Refill request from [facility], [address], Linna Hoff also via fax for Febuxostat/Uloric 80mg  tablets, 1 tablet by mouth every day, has been done per Dr Luna Glasgow

## 2018-07-21 DIAGNOSIS — M9903 Segmental and somatic dysfunction of lumbar region: Secondary | ICD-10-CM | POA: Diagnosis not present

## 2018-07-21 DIAGNOSIS — M545 Low back pain: Secondary | ICD-10-CM | POA: Diagnosis not present

## 2018-07-21 DIAGNOSIS — M9905 Segmental and somatic dysfunction of pelvic region: Secondary | ICD-10-CM | POA: Diagnosis not present

## 2019-01-29 ENCOUNTER — Other Ambulatory Visit: Payer: Self-pay

## 2019-01-29 ENCOUNTER — Encounter: Payer: Self-pay | Admitting: Neurology

## 2019-01-29 ENCOUNTER — Ambulatory Visit (INDEPENDENT_AMBULATORY_CARE_PROVIDER_SITE_OTHER): Payer: 59 | Admitting: Neurology

## 2019-01-29 VITALS — BP 173/103 | HR 68 | Ht 68.5 in | Wt 189.0 lb

## 2019-01-29 DIAGNOSIS — R569 Unspecified convulsions: Secondary | ICD-10-CM

## 2019-01-29 NOTE — Progress Notes (Signed)
Subjective:    Patient ID: Jason Mcmahon is a 53 y.o. male.  HPI     Star Age, MD, PhD [facility] [address], [address] [place], [place] [address]  Dear Dr. Hilma Favors, I saw your patient, Jason Mcmahon, upon your kind request in my neurologic clinic today for initial consultation of his seizure event.  The patient is unaccompanied today.  As you know, Jason Mcmahon is a 53 year old right-hand gentleman with an underlying medical history of hypertension, gout, and overweight state, who reports a witnessed seizure event with postictal symptoms noted on 01/17/2019.  The seizure occurred when he was in [place], he was in a casino at the time.  He reports that they had just reached the casino.  He had been there once before.  His wife was away in [place] with their son and grandson.  He had gone with a friend and his girlfriend and patient had not been driving, it was about a 4-hour drive, he went to the bathroom and sat down to start playing, he was playing for about 10 minutes and then started having some blurry vision, tried to lean back because he felt he needed to focus differently with his eyes and that was the last thing he remembered.  Apparently he fell off the stool, he had a witnessed generalized seizure event which he believes was about 3 to 5 minutes by a later report back to him.  He was post ictal afterwards including groggy and does not recollect having conversations with EMS afterwards although he was talking to them.  They also called his wife apparently but he does not remember that.  He does remember the hospital work-up.  He also remembers being told that he could not drive for 6 months.  He was also told to have an EEG as an outpatient.  He denies any drinking alcohol or illicit drug use, in fact he reports that he does not drink alcohol at all. He has no prior history of epilepsy, no FHx of seizures.  He has 2 sons, his older son is from  a previous relationship. He has not had any residual symptoms or further seizure-like events.  No sudden onset of one-sided weakness or numbness, no slurring of speech, no recurrent headaches.  He had a hospital admission in [place], [place].  I reviewed the hospital records as well as your office note from 01/22/2019 which you kindly included.  MRI brain without contrast on 01/18/2019 showed impression: No acute intracranial process.  Bilateral white matter foci are more numerous than would be expected for 53 years.  Differential considerations include chronic microvascular ischemic changes, migrainous changes, among other etiologies.  CT head without contrast on 01/17/2019 showed: Negative CT head.  He had a left shoulder x-ray after his fall on 01/17/2019 which showed: Impression: Negative exam.  He had a neurologic consultation as I understand, I do not have those records.  He reports that he still has some left shoulder soreness.  He has an eye appointment pending for later this year, he has prescription trifocals.  He has not been driving a car, he is worried about not being able to drive for 6 months but is willing to commit to that.  He works in [place], he works on [employer], has a 45-minute commute to work, has to be at work at 75.  He is in bed at 8 typically and rise time is around 430.  He  does report snoring but feels like he is sleeping well.  He would be willing to ask about apneas and sleep disturbance from his wife's end of things when he talks to her at home.  He would be willing to proceed with an EEG and MRI with contrast.  He has a remote history of migraines when he was in his 9s in fact he had a migraine with left-sided weakness one time.  He now has rare migraine headaches, no one-sided weakness or numbness, does not take any medication for his migraines, tries to just sleep it off in a dark bedroom.  His Past Medical History Is Significant For: Past Medical History:   Diagnosis Date  . Gout   . Hypertension     His Past Surgical History Is Significant For: Past Surgical History:  Procedure Laterality Date  . arm surgery    . COLONOSCOPY N/A 02/28/2016   Procedure: COLONOSCOPY;  Surgeon: Aviva Signs, MD;  Location: [facility];  Service: Gastroenterology;  Laterality: N/A;    His Family History Is Significant For: Family History  Problem Relation Age of Onset  . Liver disease Father   . Hypertension Father   . Cancer Father   . Seizures Paternal Aunt   . Diabetes Paternal Grandmother     His Social History Is Significant For: Social History   Socioeconomic History  . Marital status: Married    Spouse name: Not on file  . Number of children: Not on file  . Years of education: Not on file  . Highest education level: Not on file  Occupational History  . Not on file  Social Needs  . Financial resource strain: Not on file  . Food insecurity    Worry: Not on file    Inability: Not on file  . Transportation needs    Medical: Not on file    Non-medical: Not on file  Tobacco Use  . Smoking status: Former Smoker    Packs/day: 3.00    Years: 18.00    Pack years: 54.00    Types: Cigarettes  . Smokeless tobacco: Never Used  Substance and Sexual Activity  . Alcohol use: No  . Drug use: No  . Sexual activity: Not on file  Lifestyle  . Physical activity    Days per week: Not on file    Minutes per session: Not on file  . Stress: Not on file  Relationships  . Social Herbalist on phone: Not on file    Gets together: Not on file    Attends religious service: Not on file    Active member of club or organization: Not on file    Attends meetings of clubs or organizations: Not on file    Relationship status: Not on file  Other Topics Concern  . Not on file  Social History Narrative  . Not on file    His Allergies Are:  No Known Allergies:   His Current Medications Are:  Outpatient Encounter Medications as of  01/29/2019  Medication Sig  . lisinopril (ZESTRIL) 20 MG tablet Take 20 mg by mouth daily.  Marland Kitchen ULORIC 80 MG TABS TAKE 1 TABLET BY MOUTH ONCE DAILY  . [DISCONTINUED] allopurinol (ZYLOPRIM) 300 MG tablet Take 1 tablet (300 mg total) by mouth daily.  . [DISCONTINUED] ciprofloxacin (CIPRO) 500 MG tablet Take 1 tablet (500 mg total) by mouth 2 (two) times daily. One po bid x 7 days  . [DISCONTINUED] Colchicine (MITIGARE) 0.6 MG  CAPS Take 0.6 mg by mouth 3 (three) times daily.  . [DISCONTINUED] HYDROcodone-acetaminophen (NORCO/VICODIN) 5-325 MG tablet Take 1 tablet by mouth every 6 (six) hours as needed for severe pain.  . [DISCONTINUED] lisinopril (PRINIVIL,ZESTRIL) 10 MG tablet Take 10 mg by mouth daily.  . [DISCONTINUED] metroNIDAZOLE (FLAGYL) 500 MG tablet Take 1 tablet (500 mg total) by mouth 2 (two) times daily. One po bid x 7 days   No facility-administered encounter medications on file as of 01/29/2019.   :   Review of Systems:  Out of a complete 14 point review of systems, all are reviewed and negative with the exception of these symptoms as listed below:  Review of Systems  Neurological:       Pt presents today to discuss his first seizure last week. Pt denies EtoH use, drugs, sleep deprivation, change in meds, prior to seizure.     Objective:  Neurological Exam  Physical Exam Physical Examination:   Vitals:   01/29/19 1003  BP: (!) 173/103  Pulse: 68    General Examination: The patient is a very pleasant 53 y.o. male in no acute distress. He appears well-developed and well-nourished and well groomed.   HEENT: Normocephalic, atraumatic, pupils are equal, round and reactive to light and accommodation. Funduscopic exam is normal with sharp disc margins noted. Extraocular tracking is good without limitation to gaze excursion or nystagmus noted. Normal smooth pursuit is noted. Hearing is grossly intact. Face is symmetric with normal facial animation and normal facial sensation.  Speech is clear with no dysarthria noted. There is no hypophonia. There is no lip, neck/head, jaw or voice tremor. Neck is supple with full range of passive and active motion. There are no carotid bruits on auscultation. Oropharynx exam reveals: mild mouth dryness, adequate dental hygiene With dentures on top and his own teeth on the bottom, mild airway crowding secondary to small airway entry and redundant soft palate, tonsils are small, Mallampati class II.  Tongue protrudes centrally in palate elevates symmetrically  Chest: Clear to auscultation without wheezing, rhonchi or crackles noted.  Heart: S1+S2+0, regular and normal without murmurs, rubs or gallops noted.   Abdomen: Soft, non-tender and non-distended with normal bowel sounds appreciated on auscultation.  Extremities: There is no pitting edema in the distal lower extremities bilaterally. Pedal pulses are intact.  Skin: Warm and dry without trophic changes noted.  Musculoskeletal: exam reveals no obvious joint deformities, tenderness or joint swelling or erythema.   Neurologically:  Mental status: The patient is awake, alert and oriented in all 4 spheres. His immediate and remote memory, attention, language skills and fund of knowledge are appropriate. There is no evidence of aphasia, agnosia, apraxia or anomia. Speech is clear with normal prosody and enunciation. Thought process is linear. Mood is normal and affect is normal.  Cranial nerves II - XII are as described above under HEENT exam. In addition: shoulder shrug is normal with equal shoulder height noted. Motor exam: Normal bulk, strength and tone is noted. There is no drift, tremor or rebound. Romberg is negative. Reflexes are 2+ throughout. Babinski: Toes are flexor bilaterally. Fine motor skills and coordination: intact with normal finger taps, normal hand movements, normal rapid alternating patting, normal foot taps and normal foot agility.  Cerebellar testing: No dysmetria or  intention tremor on finger to nose testing. Heel to shin is unremarkable bilaterally. There is no truncal or gait ataxia.  Sensory exam: intact to light touch, vibration in the upper and lower extremities.  Gait,  station and balance: He stands easily. No veering to one side is noted. No leaning to one side is noted. Posture is age-appropriate and stance is narrow based. Gait shows normal stride length and normal pace. No problems turning are noted. Tandem walk is unremarkable.    Assessment and Plan:   In summary, ALLIN FRIX is a very pleasant 53 y.o.-year old male with an underlying medical history of hypertension, gout, and overweight state, who Presents for evaluation after a singular seizure event which was a witnessed generalized seizure event with postictal symptoms of confusion, amnesia and sleepiness.  His postictal period was at least 40 minutes long as he was told.  Seizure event was isolated and unprovoked from what we know, seizure duration about 3 to 5 minutes.  He has no history of prodromal illness, no history of drug use, alcohol use, new medications or taking anything nonprescribed. He is advised to follow seizure precautions and not drive until he Continues to be seizure-free for 6 months, I explained the DMV criteria And [place] regulations to him.  He is willing to adhere to this.  We talked about seizure triggers.  He is advised to stay well rested, well hydrated, I would like to proceed with completion of evaluation with an EEG and brain MRI with contrast as he had a noncontrasted MRI before.  We Will check CMP today in preparation for the MRI with contrast.  He is advised to talk to his wife about his sleep, in particular loud snoring or concern for apneas, we may consider a sleep study.He does not need any new medication from my end of things at this time. I suggested a follow-up in 6 months routinely, sooner if needed, we will keep him posted as to his MRI and EEG results  in the interim by phone.  I answered all his questions today and he was in agreement. Thank you very much for allowing me to participate in the care of this nice patient. If I can be of any further assistance to you please do not hesitate to call me at [phone].  Sincerely,   Star Age, MD, PhD

## 2019-01-29 NOTE — Patient Instructions (Signed)
  Please remember, common seizure triggers are: Sleep deprivation, dehydration, overheating, stress, hypoglycemia or skipping meals, certain medications or excessive alcohol use, especially stopping alcohol abruptly if you have had heavy alcohol use before (aka alcohol withdrawal seizure). If you have a prolonged seizure over 2-5 minutes or back to back seizures, call or have someone call 911 or take you to the nearest emergency room. You cannot drive a car or operate any other machinery or vehicle within 6 months of a seizure. Please do not swim alone or take a tub bath for safety. Do not climb on ladders or be at heights alone. Do not cook with large quantities of boiling water or oil for safety. Please ensure the water temperature at home is not too high. When carrying or caring for small children and infants, make sure you sit down when holding the child are feeding the child or changing them to minimize risk for injury to the child are to you if you were to have a seizure.  We do not need to treat you with medication for an isolated seizure event. Avoid taking Wellbutrin, narcotic pain medications and tramadol, as they can lower seizure threshold.   As per [facility] statutes, patients with seizures and epilepsy are not allowed to drive until they have been seizure-free for at least 6 months (6-12 months are recommended). This also applies to driving or using heavy equipment or power tools.  We will do an EEG (brainwave test), which we will schedule. We will call you with the results.  We will check blood work today for Kidney and liver function.  We will do a brain scan for completion, with contrast (ie MRI) and call you with the test results. We will have to schedule you for this on a separate date. This test requires authorization from your insurance, and we will take care of the insurance process.  Follow up in 6 months, Reassuringly, you have no abnormality on neurological  exam.

## 2019-01-30 LAB — COMPREHENSIVE METABOLIC PANEL
ALT: 46 IU/L — ABNORMAL HIGH (ref 0–44)
AST: 32 IU/L (ref 0–40)
Albumin/Globulin Ratio: 1.8 (ref 1.2–2.2)
Albumin: 4.6 g/dL (ref 3.8–4.9)
Alkaline Phosphatase: 79 IU/L (ref 39–117)
BUN/Creatinine Ratio: 11 (ref 9–20)
BUN: 13 mg/dL (ref 6–24)
Bilirubin Total: 1 mg/dL (ref 0.0–1.2)
CO2: 24 mmol/L (ref 20–29)
Calcium: 9.5 mg/dL (ref 8.7–10.2)
Chloride: 104 mmol/L (ref 96–106)
Creatinine, Ser: 1.23 mg/dL (ref 0.76–1.27)
GFR calc Af Amer: 77 mL/min/{1.73_m2} (ref >59)
GFR calc non Af Amer: 67 mL/min/{1.73_m2} (ref >59)
Globulin, Total: 2.5 g/dL (ref 1.5–4.5)
Glucose: 72 mg/dL (ref 65–99)
Potassium: 4.1 mmol/L (ref 3.5–5.2)
Sodium: 144 mmol/L (ref 134–144)
Total Protein: 7.1 g/dL (ref 6.0–8.5)

## 2019-02-01 ENCOUNTER — Telehealth: Payer: Self-pay | Admitting: Orthopaedic Surgery

## 2019-02-02 ENCOUNTER — Telehealth: Payer: Self-pay | Admitting: Neurology

## 2019-02-02 ENCOUNTER — Telehealth: Payer: Self-pay

## 2019-02-02 NOTE — Telephone Encounter (Signed)
-----   Message from Star Age, MD sent at 02/02/2019  8:09 AM EDT ----- Kidney and liver function okay, okay to proceed with MRI w contrast.  Michel Bickers

## 2019-02-02 NOTE — Progress Notes (Signed)
Kidney and liver function okay, okay to proceed with MRI w contrast.  Michel Bickers

## 2019-02-02 NOTE — Telephone Encounter (Signed)
[employer] Auth: [employer] via [facility].   Patient is scheduled at [facility] for Thursday 02/05/19 arrival time is 4:30 pm. Patient is aware of time & day. I also gave him their number of [phone] incase he needed to r/s.

## 2019-02-02 NOTE — Telephone Encounter (Signed)
I called pt. I advised him of his lab results. Pt verbalized understanding of results. Pt had no questions at this time but was encouraged to call back if questions arise.

## 2019-02-03 NOTE — Telephone Encounter (Signed)
Regarding refill request received via [employer] interface online for medication: Febuxostat (ULORIC) 80 MG TABS,  [facility], [place], faxed a request for an alternative medication, as above is not covered by patient's plan. Alternative: ALLOPURINOL TABMG

## 2019-02-03 NOTE — Telephone Encounter (Signed)
Patient requests to stay on Febuxostat (ULORIC) 80 MG TABS as prescribed. States aware that is not covered by plan, and states has been paying the out of pocket amount for it for some time; still does not want to change to Allopurinol.

## 2019-02-03 NOTE — Telephone Encounter (Signed)
Tell patient.  Ask if that will be OK.

## 2019-02-04 NOTE — Telephone Encounter (Signed)
Spoke with Estill Bamberg at [facility], [address]; relayed, per Dr Luna Glasgow. Patient aware.

## 2019-02-04 NOTE — Telephone Encounter (Signed)
Called back to [employer] [facility] to notify.

## 2019-02-04 NOTE — Telephone Encounter (Signed)
Please tell pharmacy to dispense as prescribed.  Patient will pay out of pocket.  Tell them not to keep asking [place] to change in the future.  Thanks.

## 2019-02-05 ENCOUNTER — Ambulatory Visit (HOSPITAL_COMMUNITY)
Admission: RE | Admit: 2019-02-05 | Discharge: 2019-02-05 | Disposition: A | Discharge status: Home / Self Care | Payer: 59 | Source: Ambulatory Visit | Attending: Neurology | Admitting: Neurology

## 2019-02-05 ENCOUNTER — Other Ambulatory Visit: Payer: Self-pay

## 2019-02-05 DIAGNOSIS — R569 Unspecified convulsions: Secondary | ICD-10-CM | POA: Insufficient documentation

## 2019-02-05 MED ORDER — GADOBUTROL 1 MMOL/ML IV SOLN
10.0000 mL | Freq: Once | INTRAVENOUS | Status: AC | PRN
  Administered 2019-02-05: 10 mL via INTRAVENOUS

## 2019-02-10 ENCOUNTER — Telehealth: Payer: Self-pay | Admitting: Neurology

## 2019-02-10 NOTE — Telephone Encounter (Signed)
I called the patient.  The patient does have some mild small vessel disease, he has had poorly controlled hypertension, just recently had another blood pressure medication added to his regimen.  Nothing on the MRI that would result in seizures.   MRI brain 02/06/19:  IMPRESSION: 1. No acute intracranial abnormality or cause of seizures identified. 2. Mild chronic small vessel ischemic disease and cerebral atrophy.

## 2019-02-18 ENCOUNTER — Other Ambulatory Visit: Payer: Self-pay

## 2019-02-18 ENCOUNTER — Ambulatory Visit (INDEPENDENT_AMBULATORY_CARE_PROVIDER_SITE_OTHER): Payer: 59

## 2019-02-18 DIAGNOSIS — R569 Unspecified convulsions: Secondary | ICD-10-CM | POA: Diagnosis not present

## 2019-02-24 ENCOUNTER — Telehealth: Payer: Self-pay

## 2019-02-24 NOTE — Progress Notes (Signed)
Please call and advise the patient that the EEG or brain wave test we performed was reported as normal in the awake and drowsy states. We checked for abnormal electrical discharges in the brain waves and the report suggested normal findings. No further action is required on this test at this time.  FU in 6 months, ie Jan 2021, may see NP. No appt pending. Please assist with appt.

## 2019-02-24 NOTE — Telephone Encounter (Signed)
-----   Message from Star Age, MD sent at 02/24/2019  8:40 AM EDT ----- Please call and advise the patient that the EEG or brain wave test we performed was reported as normal in the awake and drowsy states. We checked for abnormal electrical discharges in the brain waves and the report suggested normal findings. No further action is required on this test at this time.  FU in 6 months, ie Jan 2021, may see NP. No appt pending. Please assist with appt.

## 2019-02-24 NOTE — Telephone Encounter (Signed)
Pt returned call. Please call back as soon as available.  °

## 2019-02-24 NOTE — Telephone Encounter (Signed)
I called pt. I advised him of his EEG results and recommendations. An appt was scheduled with Amy, NP on 07/13/19 at 3:30pm. Pt verbalized understanding of results and recommendations and of appt date and time.

## 2019-02-24 NOTE — Procedures (Signed)
    History:  Jason Mcmahon is a 53 year old gentleman with a history of hypertension and gout, he has a history of witnessed seizure event on 17 January 2019.  The patient [place] and fell off his stool and had a witnessed generalized seizure.  He is being evaluated for this event.  This is a routine EEG.  No skull defects are noted.  Medications include lisinopril and Uloric.  EEG classification: Normal awake and drowsy  Description of the recording: The background rhythms of this recording consists of a fairly well modulated medium amplitude alpha rhythm of 10 Hz that is reactive to eye opening and closure. As the record progresses, the patient appears to remain in the waking state throughout the recording. Photic stimulation was performed, resulting in a bilateral and symmetric photic driving response. Hyperventilation was also performed, resulting in a minimal buildup of the background rhythm activities without significant slowing seen. Toward the end of the recording, the patient enters the drowsy state with slight symmetric slowing seen. The patient never enters stage II sleep. At no time during the recording does there appear to be evidence of spike or spike wave discharges or evidence of focal slowing. EKG monitor shows no evidence of cardiac rhythm abnormalities with a heart rate of 72.  Impression: This is a normal EEG recording in the waking and drowsy state. No evidence of ictal or interictal discharges are seen.

## 2019-02-24 NOTE — Telephone Encounter (Signed)
I called pt to discuss. No answer, left a message asking him to call me back. 

## 2019-04-02 ENCOUNTER — Other Ambulatory Visit (HOSPITAL_COMMUNITY): Payer: Self-pay | Admitting: Physician Assistant

## 2019-04-02 ENCOUNTER — Other Ambulatory Visit: Payer: Self-pay | Admitting: Physician Assistant

## 2019-04-02 DIAGNOSIS — N179 Acute kidney failure, unspecified: Secondary | ICD-10-CM

## 2019-04-02 DIAGNOSIS — R749 Abnormal serum enzyme level, unspecified: Secondary | ICD-10-CM

## 2019-04-04 ENCOUNTER — Observation Stay (HOSPITAL_COMMUNITY)
Admission: EM | Admit: 2019-04-04 | Discharge: 2019-04-06 | DRG: 446 | Disposition: A | Discharge status: Home / Self Care | Payer: 59 | Attending: Family Medicine | Admitting: Family Medicine

## 2019-04-04 ENCOUNTER — Encounter (HOSPITAL_COMMUNITY): Payer: Self-pay | Admitting: *Deleted

## 2019-04-04 ENCOUNTER — Other Ambulatory Visit: Payer: Self-pay

## 2019-04-04 DIAGNOSIS — K810 Acute cholecystitis: Principal | ICD-10-CM | POA: Diagnosis present

## 2019-04-04 DIAGNOSIS — Z23 Encounter for immunization: Secondary | ICD-10-CM

## 2019-04-04 DIAGNOSIS — Z87891 Personal history of nicotine dependence: Secondary | ICD-10-CM | POA: Diagnosis not present

## 2019-04-04 DIAGNOSIS — I10 Essential (primary) hypertension: Secondary | ICD-10-CM | POA: Diagnosis present

## 2019-04-04 DIAGNOSIS — R7401 Elevation of levels of liver transaminase levels: Secondary | ICD-10-CM | POA: Diagnosis not present

## 2019-04-04 DIAGNOSIS — K819 Cholecystitis, unspecified: Secondary | ICD-10-CM

## 2019-04-04 DIAGNOSIS — Z8249 Family history of ischemic heart disease and other diseases of the circulatory system: Secondary | ICD-10-CM

## 2019-04-04 DIAGNOSIS — R945 Abnormal results of liver function studies: Secondary | ICD-10-CM | POA: Diagnosis present

## 2019-04-04 DIAGNOSIS — R109 Unspecified abdominal pain: Secondary | ICD-10-CM | POA: Diagnosis present

## 2019-04-04 DIAGNOSIS — R7989 Other specified abnormal findings of blood chemistry: Secondary | ICD-10-CM | POA: Diagnosis present

## 2019-04-04 DIAGNOSIS — Z79899 Other long term (current) drug therapy: Secondary | ICD-10-CM | POA: Diagnosis not present

## 2019-04-04 DIAGNOSIS — M109 Gout, unspecified: Secondary | ICD-10-CM | POA: Diagnosis not present

## 2019-04-04 DIAGNOSIS — Z20828 Contact with and (suspected) exposure to other viral communicable diseases: Secondary | ICD-10-CM | POA: Diagnosis present

## 2019-04-04 DIAGNOSIS — K5900 Constipation, unspecified: Secondary | ICD-10-CM | POA: Diagnosis not present

## 2019-04-04 NOTE — ED Triage Notes (Signed)
Pt c/o lower abd pain, reports that his last :"normal" bowel movement was a week ago, denies any n/v

## 2019-04-05 ENCOUNTER — Emergency Department (HOSPITAL_COMMUNITY): Payer: 59

## 2019-04-05 ENCOUNTER — Encounter (HOSPITAL_COMMUNITY): Payer: Self-pay

## 2019-04-05 ENCOUNTER — Inpatient Hospital Stay (HOSPITAL_COMMUNITY): Payer: 59

## 2019-04-05 ENCOUNTER — Other Ambulatory Visit: Payer: Self-pay

## 2019-04-05 DIAGNOSIS — R945 Abnormal results of liver function studies: Secondary | ICD-10-CM

## 2019-04-05 DIAGNOSIS — K819 Cholecystitis, unspecified: Secondary | ICD-10-CM | POA: Diagnosis present

## 2019-04-05 DIAGNOSIS — I10 Essential (primary) hypertension: Secondary | ICD-10-CM | POA: Diagnosis present

## 2019-04-05 DIAGNOSIS — K810 Acute cholecystitis: Secondary | ICD-10-CM

## 2019-04-05 DIAGNOSIS — R7989 Other specified abnormal findings of blood chemistry: Secondary | ICD-10-CM | POA: Diagnosis present

## 2019-04-05 DIAGNOSIS — R109 Unspecified abdominal pain: Secondary | ICD-10-CM | POA: Diagnosis present

## 2019-04-05 LAB — CBC WITH DIFFERENTIAL/PLATELET
Abs Immature Granulocytes: 0.01 10*3/uL (ref 0.00–0.07)
Basophils Absolute: 0 10*3/uL (ref 0.0–0.1)
Basophils Relative: 1 %
Eosinophils Absolute: 0.1 10*3/uL (ref 0.0–0.5)
Eosinophils Relative: 2 %
HCT: 40.8 % (ref 39.0–52.0)
Hemoglobin: 13.5 g/dL (ref 13.0–17.0)
Immature Granulocytes: 0 %
Lymphocytes Relative: 30 %
Lymphs Abs: 1.2 10*3/uL (ref 0.7–4.0)
MCH: 31.8 pg (ref 26.0–34.0)
MCHC: 33.1 g/dL (ref 30.0–36.0)
MCV: 96 fL (ref 80.0–100.0)
Monocytes Absolute: 0.4 10*3/uL (ref 0.1–1.0)
Monocytes Relative: 11 %
Neutro Abs: 2.3 10*3/uL (ref 1.7–7.7)
Neutrophils Relative %: 56 %
Platelets: 139 10*3/uL — ABNORMAL LOW (ref 150–400)
RBC: 4.25 MIL/uL (ref 4.22–5.81)
RDW: 12.9 % (ref 11.5–15.5)
WBC: 4.1 10*3/uL (ref 4.0–10.5)
nRBC: 0 % (ref 0.0–0.2)

## 2019-04-05 LAB — COMPREHENSIVE METABOLIC PANEL
ALT: 342 U/L — ABNORMAL HIGH (ref 0–44)
AST: 219 U/L — ABNORMAL HIGH (ref 15–41)
Albumin: 3.7 g/dL (ref 3.5–5.0)
Alkaline Phosphatase: 180 U/L — ABNORMAL HIGH (ref 38–126)
Anion gap: 7 (ref 5–15)
BUN: 15 mg/dL (ref 6–20)
CO2: 22 mmol/L (ref 22–32)
Calcium: 8.8 mg/dL — ABNORMAL LOW (ref 8.9–10.3)
Chloride: 105 mmol/L (ref 98–111)
Creatinine, Ser: 1.18 mg/dL (ref 0.61–1.24)
GFR calc Af Amer: >60 mL/min (ref >60)
GFR calc non Af Amer: >60 mL/min (ref >60)
Glucose, Bld: 100 mg/dL — ABNORMAL HIGH (ref 70–99)
Potassium: 3.6 mmol/L (ref 3.5–5.1)
Sodium: 134 mmol/L — ABNORMAL LOW (ref 135–145)
Total Bilirubin: 2 mg/dL — ABNORMAL HIGH (ref 0.3–1.2)
Total Protein: 7.6 g/dL (ref 6.5–8.1)

## 2019-04-05 LAB — URINALYSIS, ROUTINE W REFLEX MICROSCOPIC
Bilirubin Urine: NEGATIVE
Glucose, UA: NEGATIVE mg/dL
Hgb urine dipstick: NEGATIVE
Ketones, ur: NEGATIVE mg/dL
Leukocytes,Ua: NEGATIVE
Nitrite: NEGATIVE
Protein, ur: NEGATIVE mg/dL
Specific Gravity, Urine: 1.006 (ref 1.005–1.030)
pH: 6 (ref 5.0–8.0)

## 2019-04-05 LAB — SARS CORONAVIRUS 2 BY RT PCR (HOSPITAL ORDER, PERFORMED IN ~~LOC~~ HOSPITAL LAB): SARS Coronavirus 2: NEGATIVE

## 2019-04-05 LAB — LIPASE, BLOOD: Lipase: 48 U/L (ref 11–51)

## 2019-04-05 LAB — HIV ANTIBODY (ROUTINE TESTING W REFLEX): HIV Screen 4th Generation wRfx: NONREACTIVE

## 2019-04-05 MED ORDER — LISINOPRIL 10 MG PO TABS
20.0000 mg | ORAL_TABLET | Freq: Every day | ORAL | Status: DC
  Administered 2019-04-05 – 2019-04-06 (×2): 20 mg via ORAL
  Filled 2019-04-05 (×2): qty 2

## 2019-04-05 MED ORDER — MILK AND MOLASSES ENEMA
1.0000 | Freq: Once | RECTAL | Status: AC
  Administered 2019-04-05: 240 mL via RECTAL

## 2019-04-05 MED ORDER — MORPHINE SULFATE (PF) 4 MG/ML IV SOLN
4.0000 mg | INTRAVENOUS | Status: DC | PRN
  Administered 2019-04-05: 4 mg via INTRAVENOUS
  Filled 2019-04-05: qty 1

## 2019-04-05 MED ORDER — SODIUM CHLORIDE 0.9 % IV BOLUS
500.0000 mL | Freq: Once | INTRAVENOUS | Status: AC
  Administered 2019-04-05: 500 mL via INTRAVENOUS

## 2019-04-05 MED ORDER — SODIUM CHLORIDE 0.9 % IV SOLN
2.0000 g | Freq: Once | INTRAVENOUS | Status: AC
  Administered 2019-04-05: 2 g via INTRAVENOUS
  Filled 2019-04-05: qty 20

## 2019-04-05 MED ORDER — SIMETHICONE 80 MG PO CHEW
80.0000 mg | CHEWABLE_TABLET | Freq: Once | ORAL | Status: AC
  Administered 2019-04-05: 80 mg via ORAL
  Filled 2019-04-05: qty 1

## 2019-04-05 MED ORDER — IOHEXOL 300 MG/ML  SOLN
100.0000 mL | Freq: Once | INTRAMUSCULAR | Status: AC | PRN
  Administered 2019-04-05: 100 mL via INTRAVENOUS

## 2019-04-05 MED ORDER — METRONIDAZOLE IN NACL 5-0.79 MG/ML-% IV SOLN
500.0000 mg | Freq: Once | INTRAVENOUS | Status: AC
  Administered 2019-04-05: 500 mg via INTRAVENOUS
  Filled 2019-04-05: qty 100

## 2019-04-05 MED ORDER — SODIUM CHLORIDE 0.9 % IV SOLN: 2.0000 g | INTRAVENOUS | Status: DC

## 2019-04-05 MED ORDER — SODIUM CHLORIDE 0.9 % IV SOLN
INTRAVENOUS | Status: DC
  Administered 2019-04-05 (×3): via INTRAVENOUS

## 2019-04-05 MED ORDER — INFLUENZA VAC SPLIT QUAD 0.5 ML IM SUSY
0.5000 mL | PREFILLED_SYRINGE | INTRAMUSCULAR | Status: AC
  Administered 2019-04-06: 0.5 mL via INTRAMUSCULAR
  Filled 2019-04-05: qty 0.5

## 2019-04-05 NOTE — Progress Notes (Signed)
ASSUMPTION OF CARE NOTE   04/05/2019 11:21 AM  Jason Mcmahon was seen and examined.  The H&P by the admitting provider, orders, imaging was reviewed.  Please see new orders.  I spoke with Dr. Arnoldo Morale and reviewed care plan.  Will get acute hepatitis panel and repeat LFTs in AM.  Will continue to follow.   Vitals:   04/05/19 0200 04/05/19 1029  BP: 121/79 133/72  Pulse: 98 93  Resp: 16 18  Temp:    SpO2: 96% 93%    Results for orders placed or performed during the hospital encounter of 04/04/19  SARS Coronavirus 2 [facility] order, Performed in [place] hospital lab) Nasopharyngeal Nasopharyngeal Swab   Specimen: Nasopharyngeal Swab  Result Value Ref Range   SARS Coronavirus 2 NEGATIVE NEGATIVE  CBC with Differential/Platelet  Result Value Ref Range   WBC 4.1 4.0 - 10.5 K/uL   RBC 4.25 4.22 - 5.81 MIL/uL   Hemoglobin 13.5 13.0 - 17.0 g/dL   HCT 40.8 39.0 - 52.0 %   MCV 96.0 80.0 - 100.0 fL   MCH 31.8 26.0 - 34.0 pg   MCHC 33.1 30.0 - 36.0 g/dL   RDW 12.9 11.5 - 15.5 %   Platelets 139 (L) 150 - 400 K/uL   nRBC 0.0 0.0 - 0.2 %   Neutrophils Relative % 56 %   Neutro Abs 2.3 1.7 - 7.7 K/uL   Lymphocytes Relative 30 %   Lymphs Abs 1.2 0.7 - 4.0 K/uL   Monocytes Relative 11 %   Monocytes Absolute 0.4 0.1 - 1.0 K/uL   Eosinophils Relative 2 %   Eosinophils Absolute 0.1 0.0 - 0.5 K/uL   Basophils Relative 1 %   Basophils Absolute 0.0 0.0 - 0.1 K/uL   Immature Granulocytes 0 %   Abs Immature Granulocytes 0.01 0.00 - 0.07 K/uL  Comprehensive metabolic panel  Result Value Ref Range   Sodium 134 (L) 135 - 145 mmol/L   Potassium 3.6 3.5 - 5.1 mmol/L   Chloride 105 98 - 111 mmol/L   CO2 22 22 - 32 mmol/L   Glucose, Bld 100 (H) 70 - 99 mg/dL   BUN 15 6 - 20 mg/dL   Creatinine, Ser 1.18 0.61 - 1.24 mg/dL   Calcium 8.8 (L) 8.9 - 10.3 mg/dL   Total Protein 7.6 6.5 - 8.1 g/dL   Albumin 3.7 3.5 - 5.0 g/dL   AST 219 (H) 15 - 41 U/L   ALT 342 (H) 0 - 44 U/L   Alkaline  Phosphatase 180 (H) 38 - 126 U/L   Total Bilirubin 2.0 (H) 0.3 - 1.2 mg/dL   GFR calc non Af Amer >60 >60 mL/min   GFR calc Af Amer >60 >60 mL/min   Anion gap 7 5 - 15  Lipase, blood  Result Value Ref Range   Lipase 48 11 - 51 U/L  Urinalysis, Routine w reflex microscopic  Result Value Ref Range   Color, Urine YELLOW YELLOW   APPearance CLEAR CLEAR   Specific Gravity, Urine 1.006 1.005 - 1.030   pH 6.0 5.0 - 8.0   Glucose, UA NEGATIVE NEGATIVE mg/dL   Hgb urine dipstick NEGATIVE NEGATIVE   Bilirubin Urine NEGATIVE NEGATIVE   Ketones, ur NEGATIVE NEGATIVE mg/dL   Protein, ur NEGATIVE NEGATIVE mg/dL   Nitrite NEGATIVE NEGATIVE   Leukocytes,Ua NEGATIVE NEGATIVE     Murvin Natal, MD [facility]   04/04/2019 11:46 PM How to contact the [facility] Attending or Consulting provider 7A -  7P or covering provider during after hours [place], for this patient?  1. Check the care team in [facility] and look for a) attending/consulting [facility] provider listed and b) the [facility] team listed 2. Log into [email or web address] and use [place]'s universal password to access. If you do not have the password, please contact the hospital operator. 3. Locate the [facility] provider you are looking for under [facility] and page to a number that you can be directly reached. 4. If you still have difficulty reaching the provider, please page the [facility] (Director on Call) for the Hospitalists listed on amion for assistance.

## 2019-04-05 NOTE — H&P (Signed)
History and Physical    Jason Mcmahon [id number] DOB: 05-19-1966 DOA: 04/04/2019  PCP: Cory Munch, PA-C (Confirm with patient/family/NH records and if not entered, this has to be entered at [facility] point of entry) Patient coming from: Home   I have personally briefly reviewed patient's old medical records in [place]  Chief Complaint: Abdominal pain  HPI: 33-year man from community with past medical history of hypertension presented to the ED with complaint of abdominal pain of 3 to 4 days duration.  As per patient he has been having lower abdominal pain intermittent crampy in nature for the past 3 to 4 days.  He describes pain similar to the pain that he had when he was diagnosed with diverticulitis 2 years ago.  Endorses to constipation but no complaint of nausea, vomiting, diarrhea, hematochezia, melena, fever and chills.  No sick contacts or travel history.  ED Course:  In the ED at the time of presentation patient was mildly hypertensive saturating 97% on room air with no tachypnea with T-max 99, labs with mild hyponatremia 134 BUN 15 creatinine 1.18, liver chemistries AST 219 ALT 342, bilirubin 2.2, no leukocytosis H&H stable with hemoglobin 13.5, urine unremarkable, CT scan impressive of hyperenhancement of the gallbladder wall with surrounding fat stranding changes concerning for cholecystitis ultrasound abdomen pending.  Patient was started on ceftriaxone 2 g of metronidazole 500 mg every 8 hourly, admitted for management of acute cholecystitis surgery was called from the ED will evaluate the patient in the morning.  Review of Systems: As per HPI all other systems reviewed and negative.  Past Medical History:  Diagnosis Date  . Gout   . Hypertension     Past Surgical History:  Procedure Laterality Date  . arm surgery    . COLONOSCOPY N/A 02/28/2016   Procedure: COLONOSCOPY;  Surgeon: Aviva Signs, MD;  Location: [facility];  Service: Gastroenterology;   Laterality: N/A;     reports that he has quit smoking. His smoking use included cigarettes. He has a 54.00 pack-year smoking history. He has never used smokeless tobacco. He reports that he does not drink alcohol or use drugs.  No Known Allergies  Family History  Problem Relation Age of Onset  . Liver disease Father   . Hypertension Father   . Cancer Father   . Seizures Paternal Aunt   . Diabetes Paternal Grandmother     Acceptable: Family history reviewed and not pertinent (If you reviewed it)  Prior to Admission medications   Medication Sig Start Date End Date Taking? Authorizing Provider  amLODipine (NORVASC) 10 MG tablet  03/27/19   [provider]  lisinopril (ZESTRIL) 10 MG tablet 20 mg.  02/18/19   [provider]  lisinopril (ZESTRIL) 20 MG tablet Take 20 mg by mouth daily.    [provider]  ULORIC 80 MG TABS TAKE 1 TABLET BY MOUTH EVERY DAY 02/03/19   Sanjuana Kava, MD    Physical Exam: Vitals:   04/04/19 2350 04/04/19 2351 04/05/19 0134 04/05/19 0200  BP: (!) 144/83  (!) 144/93 121/79  Pulse: 94  95 98  Resp: 14  16 16   Temp: 99 F (37.2 C)     TempSrc: Oral     SpO2: 97%  96% 96%  Weight:  88 kg    Height:  5' 8.5" (1.74 m)      Constitutional: NAD, calm, comfortable Vitals:   04/04/19 2350 04/04/19 2351 04/05/19 0134 04/05/19 0200  BP: (!) 144/83  Marland Kitchen)  144/93 121/79  Pulse: 94  95 98  Resp: 14  16 16   Temp: 99 F (37.2 C)     TempSrc: Oral     SpO2: 97%  96% 96%  Weight:  88 kg    Height:  5' 8.5" (1.74 m)     Eyes: PERRL, lids and conjunctivae normal ENMT: Mucous membranes are moist. Posterior pharynx clear of any exudate or lesions.Normal dentition.  Neck: normal, supple, no masses, no thyromegaly Respiratory: clear to auscultation bilaterally, no wheezing, no crackles. Normal respiratory effort. No accessory muscle use.  Cardiovascular: Regular rate and rhythm, no murmurs / rubs / gallops. No extremity edema. 2+ pedal  pulses. No carotid bruits.  Abdomen: Diffuse tenderness no suprapubic tenderness , no masses palpated. No hepatosplenomegaly. Bowel sounds positive.  Musculoskeletal: no clubbing / cyanosis. No joint deformity upper and lower extremities. Good ROM, no contractures. Normal muscle tone.  Skin: no rashes, lesions, ulcers. No induration Neurologic: CN 2-12 grossly intact. Sensation intact, DTR normal. Strength 5/5 in all 4.  Psychiatric: Normal judgment and insight. Alert and oriented x 3. Normal mood.   Labs on Admission: I have personally reviewed following labs and imaging studies  CBC: Recent Labs  Lab 04/05/19 0024  WBC 4.1  NEUTROABS 2.3  HGB 13.5  HCT 40.8  MCV 96.0  PLT [id number]*   Basic Metabolic Panel: Recent Labs  Lab 04/05/19 0024  NA 134*  K 3.6  CL 105  CO2 22  GLUCOSE 100*  BUN 15  CREATININE 1.18  CALCIUM 8.8*   GFR: Estimated Creatinine Clearance: 77.9 mL/min (by C-G formula based on SCr of 1.18 mg/dL). Liver Function Tests: Recent Labs  Lab 04/05/19 0024  AST 219*  ALT 342*  ALKPHOS 180*  BILITOT 2.0*  PROT 7.6  ALBUMIN 3.7   Recent Labs  Lab 04/05/19 0024  LIPASE 48   No results for input(s): AMMONIA in the last 168 hours. Coagulation Profile: No results for input(s): INR, PROTIME in the last 168 hours. Cardiac Enzymes: No results for input(s): CKTOTAL, CKMB, CKMBINDEX, TROPONINI in the last 168 hours. BNP (last 3 results) No results for input(s): PROBNP in the last 8760 hours. HbA1C: No results for input(s): HGBA1C in the last 72 hours. CBG: No results for input(s): GLUCAP in the last 168 hours. Lipid Profile: No results for input(s): CHOL, HDL, LDLCALC, TRIG, CHOLHDL, LDLDIRECT in the last 72 hours. Thyroid Function Tests: No results for input(s): TSH, T4TOTAL, FREET4, T3FREE, THYROIDAB in the last 72 hours. Anemia Panel: No results for input(s): VITAMINB12, FOLATE, FERRITIN, TIBC, IRON, RETICCTPCT in the last 72 hours. Urine analysis:     Component Value Date/Time   COLORURINE YELLOW 04/05/2019 0120   APPEARANCEUR CLEAR 04/05/2019 0120   LABSPEC 1.006 04/05/2019 0120   PHURINE 6.0 04/05/2019 0120   GLUCOSEU NEGATIVE 04/05/2019 0120   HGBUR NEGATIVE 04/05/2019 0120   BILIRUBINUR NEGATIVE 04/05/2019 0120   KETONESUR NEGATIVE 04/05/2019 0120   PROTEINUR NEGATIVE 04/05/2019 0120   UROBILINOGEN 1.0 06/24/2012 2245   NITRITE NEGATIVE 04/05/2019 0120   LEUKOCYTESUR NEGATIVE 04/05/2019 0120    Radiological Exams on Admission: Ct Abdomen Pelvis W Contrast  Result Date: 04/05/2019 CLINICAL DATA:  Abdominal pain, question of diverticulitis EXAM: CT ABDOMEN AND PELVIS WITH CONTRAST TECHNIQUE: Multidetector CT imaging of the abdomen and pelvis was performed using the standard protocol following bolus administration of intravenous contrast. CONTRAST:  119mL OMNIPAQUE IOHEXOL 300 MG/ML  SOLN COMPARISON:  January 30, 2018 FINDINGS: Lower chest: The visualized heart  size within normal limits. No pericardial fluid/thickening. No hiatal hernia. Streaky atelectasis or scarring seen at both lung bases. Hepatobiliary: The liver is normal in density without focal abnormality.The main portal vein is patent. There is hyperenhancement of the gallbladder wall with surrounding fat stranding changes. Tiny small lymph nodes are seen within the right upper quadrant. No definite gallstones are seen. Pancreas: There is mild fatty atrophy noted within the pancreas. No pancreatic ductal dilatation. Spleen: Normal in size without focal abnormality. Adrenals/Urinary Tract: Both adrenal glands appear normal. The kidneys and collecting system appear normal without evidence of urinary tract calculus or hydronephrosis. Bladder is unremarkable. Stomach/Bowel: The stomach, small bowel, and colon are normal in appearance. No inflammatory changes, wall thickening, or obstructive findings.The appendix is normal. Vascular/Lymphatic: There are no enlarged mesenteric,  retroperitoneal, or pelvic lymph nodes. A small amount of bilateral colic gutter and pelvic ascites is seen. There is also a small amount of perihepatic ascites. Scattered aortic atherosclerotic calcifications are seen without aneurysmal dilatation. Reproductive: The prostate is unremarkable. Other: No evidence of abdominal wall mass or hernia. Musculoskeletal: No acute or significant osseous findings. IMPRESSION: Findings which could be suggestive acute cholecystitis. Small amount abdominopelvic ascites. Electronically Signed   By: Prudencio Pair M.D.   On: 04/05/2019 01:36    EKG: Independently reviewed.   Assessment/Plan Principal Problem:   Acute cholecystitis Active Problems:   Abdominal pain   Abnormal LFTs   Essential hypertension     Acute cholecystitis : Presented with abdominal pain CT impressive of fat stranding around the gallbladder ultrasound pending likely from acute cholecystitis.   -Keep the patient n.p.o. except meds -Started the patient on ceftriaxone 2 g and metronidazole 500 mg every 8 hourly -Continue to trend fever curve and white count -Surgery to follow the patient in the morning  Abdominal pain : Manage acute severe pain with morphine as needed  Abnormal LFTs : In the setting of acute cholecystitis, will continue to trend liver chemistries.  If persistently elevated will proceed with hepatitis panel    Essential hypertension : Mildly elevated at the time of presentation likely secondary to pain is on lisinopril 20 mg daily continue with the same.  DVT prophylaxis: SCD  Code Status: Full code Family Communication:None  Disposition Plan:Home  Consults called: Surgery by ED  Admission status: Inpat   I certify that at the point of admission it is my clinical judgment that the patient will require inpatient hospital care spanning beyond 2 midnights from the point of admission due to high intensity of service, high risk for further deterioration and high  frequency of surveillance required. The following factors support the patient status of inpatient:    Oran Rein MD [facility]  04/05/2019, 3:26 AM

## 2019-04-05 NOTE — Consult Note (Signed)
Reason for Consult: Transaminitis Referring Physician: Dr. Kimberlee Nearing is an 53 y.o. male.  HPI: Patient is a 53 year old white male who presented to the emergency room with a 4-day history of worsening abdominal pain.  He states he was having pain in the left lower portion of his abdomen.  His last bowel movement was 1 week ago.  He thought it may been an episode of diverticulitis.  A CT scan of the abdomen was performed which revealed possible acute cholecystitis.  His liver enzyme tests are noted to be significantly elevated.  He denies IV drug use.  He was hospitalized in [place] for a seizure of unknown etiology earlier this year, but did not receive a blood transfusion.  He denies any significant alcohol use.  He denies any right upper quadrant abdominal pain, nausea, vomiting, or jaundice.  He currently has minimal left lower quadrant abdominal pain.  Past Medical History:  Diagnosis Date  . Gout   . Hypertension     Past Surgical History:  Procedure Laterality Date  . arm surgery    . COLONOSCOPY N/A 02/28/2016   Procedure: COLONOSCOPY;  Surgeon: Aviva Signs, MD;  Location: [facility];  Service: Gastroenterology;  Laterality: N/A;    Family History  Problem Relation Age of Onset  . Liver disease Father   . Hypertension Father   . Cancer Father   . Seizures Paternal Aunt   . Diabetes Paternal Grandmother     Social History:  reports that he has quit smoking. His smoking use included cigarettes. He has a 54.00 pack-year smoking history. He has never used smokeless tobacco. He reports that he does not drink alcohol or use drugs.  Allergies: No Known Allergies  Medications: I have reviewed the patient's current medications.  Results for orders placed or performed during the hospital encounter of 04/04/19 (from the past 48 hour(s))  CBC with Differential/Platelet     Status: Abnormal   Collection Time: 04/05/19 12:24 AM  Result Value Ref Range   WBC 4.1 4.0  - 10.5 K/uL   RBC 4.25 4.22 - 5.81 MIL/uL   Hemoglobin 13.5 13.0 - 17.0 g/dL   HCT 40.8 39.0 - 52.0 %   MCV 96.0 80.0 - 100.0 fL   MCH 31.8 26.0 - 34.0 pg   MCHC 33.1 30.0 - 36.0 g/dL   RDW 12.9 11.5 - 15.5 %   Platelets 139 (L) 150 - 400 K/uL   nRBC 0.0 0.0 - 0.2 %   Neutrophils Relative % 56 %   Neutro Abs 2.3 1.7 - 7.7 K/uL   Lymphocytes Relative 30 %   Lymphs Abs 1.2 0.7 - 4.0 K/uL   Monocytes Relative 11 %   Monocytes Absolute 0.4 0.1 - 1.0 K/uL   Eosinophils Relative 2 %   Eosinophils Absolute 0.1 0.0 - 0.5 K/uL   Basophils Relative 1 %   Basophils Absolute 0.0 0.0 - 0.1 K/uL   Immature Granulocytes 0 %   Abs Immature Granulocytes 0.01 0.00 - 0.07 K/uL    Comment: Performed at [facility], [address], [place], [place] [address]  Comprehensive metabolic panel     Status: Abnormal   Collection Time: 04/05/19 12:24 AM  Result Value Ref Range   Sodium 134 (L) 135 - 145 mmol/L   Potassium 3.6 3.5 - 5.1 mmol/L   Chloride 105 98 - 111 mmol/L   CO2 22 22 - 32 mmol/L   Glucose, Bld 100 (H) 70 - 99 mg/dL  BUN 15 6 - 20 mg/dL   Creatinine, Ser 1.18 0.61 - 1.24 mg/dL   Calcium 8.8 (L) 8.9 - 10.3 mg/dL   Total Protein 7.6 6.5 - 8.1 g/dL   Albumin 3.7 3.5 - 5.0 g/dL   AST 219 (H) 15 - 41 U/L   ALT 342 (H) 0 - 44 U/L   Alkaline Phosphatase 180 (H) 38 - 126 U/L   Total Bilirubin 2.0 (H) 0.3 - 1.2 mg/dL   GFR calc non Af Amer >60 >60 mL/min   GFR calc Af Amer >60 >60 mL/min   Anion gap 7 5 - 15    Comment: Performed at [facility], [address] [place], [place] [address]  Lipase, blood     Status: None   Collection Time: 04/05/19 12:24 AM  Result Value Ref Range   Lipase 48 11 - 51 U/L    Comment: Performed at [facility], [address], [place], [place] [address]  Urinalysis, Routine w reflex microscopic     Status: None   Collection Time: 04/05/19  1:20 AM  Result Value Ref Range   Color, Urine YELLOW YELLOW   APPearance CLEAR CLEAR   Specific Gravity, Urine  1.006 1.005 - 1.030   pH 6.0 5.0 - 8.0   Glucose, UA NEGATIVE NEGATIVE mg/dL   Hgb urine dipstick NEGATIVE NEGATIVE   Bilirubin Urine NEGATIVE NEGATIVE   Ketones, ur NEGATIVE NEGATIVE mg/dL   Protein, ur NEGATIVE NEGATIVE mg/dL   Nitrite NEGATIVE NEGATIVE   Leukocytes,Ua NEGATIVE NEGATIVE    Comment: Performed at [facility], [address] [place], [place] [address]  SARS Coronavirus 2 [facility] order, Performed in [facility] hospital lab) Nasopharyngeal Nasopharyngeal Swab     Status: None   Collection Time: 04/05/19  2:25 AM   Specimen: Nasopharyngeal Swab  Result Value Ref Range   SARS Coronavirus 2 NEGATIVE NEGATIVE    Comment: (NOTE) If result is NEGATIVE SARS-CoV-2 target nucleic acids are NOT DETECTED. The SARS-CoV-2 RNA is generally detectable in upper and lower  respiratory specimens during the acute phase of infection. The lowest  concentration of SARS-CoV-2 viral copies this assay can detect is 250  copies / mL. A negative result does not preclude SARS-CoV-2 infection  and should not be used as the sole basis for treatment or other  patient management decisions.  A negative result may occur with  improper specimen collection / handling, submission of specimen other  than nasopharyngeal swab, presence of viral mutation(s) within the  areas targeted by this assay, and inadequate number of viral copies  (<250 copies / mL). A negative result must be combined with clinical  observations, patient history, and epidemiological information. If result is POSITIVE SARS-CoV-2 target nucleic acids are DETECTED. The SARS-CoV-2 RNA is generally detectable in upper and lower  respiratory specimens dur ing the acute phase of infection.  Positive  results are indicative of active infection with SARS-CoV-2.  Clinical  correlation with patient history and other diagnostic information is  necessary to determine patient infection status.  Positive results do  not rule out bacterial  infection or co-infection with other viruses. If result is PRESUMPTIVE POSTIVE SARS-CoV-2 nucleic acids MAY BE PRESENT.   A presumptive positive result was obtained on the submitted specimen  and confirmed on repeat testing.  While 2019 novel coronavirus  (SARS-CoV-2) nucleic acids may be present in the submitted sample  additional confirmatory testing may be necessary for epidemiological  and / or clinical management purposes  to  differentiate between  SARS-CoV-2 and other Sarbecovirus currently known to infect humans.  If clinically indicated additional testing with an alternate test  methodology [phone]) is advised. The SARS-CoV-2 RNA is generally  detectable in upper and lower respiratory sp ecimens during the acute  phase of infection. The expected result is Negative. Fact Sheet for Patients:  StrictlyIdeas.no Fact Sheet for Healthcare Providers: BankingDealers.co.za This test is not yet approved or cleared by the [place] FDA and has been authorized for detection and/or diagnosis of SARS-CoV-2 by FDA under an Emergency Use Authorization (EUA).  This EUA will remain in effect (meaning this test can be used) for the duration of the COVID-19 declaration under Section 564(b)(1) of the Act, 21 U.S.C. section 360bbb-3(b)(1), unless the authorization is terminated or revoked sooner. Performed at [facility], [address] [place], [place] [address]     Ct Abdomen Pelvis W Contrast  Result Date: 04/05/2019 CLINICAL DATA:  Abdominal pain, question of diverticulitis EXAM: CT ABDOMEN AND PELVIS WITH CONTRAST TECHNIQUE: Multidetector CT imaging of the abdomen and pelvis was performed using the standard protocol following bolus administration of intravenous contrast. CONTRAST:  120mL OMNIPAQUE IOHEXOL 300 MG/ML  SOLN COMPARISON:  January 30, 2018 FINDINGS: Lower chest: The visualized heart size within normal limits. No pericardial  fluid/thickening. No hiatal hernia. Streaky atelectasis or scarring seen at both lung bases. Hepatobiliary: The liver is normal in density without focal abnormality.The main portal vein is patent. There is hyperenhancement of the gallbladder wall with surrounding fat stranding changes. Tiny small lymph nodes are seen within the right upper quadrant. No definite gallstones are seen. Pancreas: There is mild fatty atrophy noted within the pancreas. No pancreatic ductal dilatation. Spleen: Normal in size without focal abnormality. Adrenals/Urinary Tract: Both adrenal glands appear normal. The kidneys and collecting system appear normal without evidence of urinary tract calculus or hydronephrosis. Bladder is unremarkable. Stomach/Bowel: The stomach, small bowel, and colon are normal in appearance. No inflammatory changes, wall thickening, or obstructive findings.The appendix is normal. Vascular/Lymphatic: There are no enlarged mesenteric, retroperitoneal, or pelvic lymph nodes. A small amount of bilateral colic gutter and pelvic ascites is seen. There is also a small amount of perihepatic ascites. Scattered aortic atherosclerotic calcifications are seen without aneurysmal dilatation. Reproductive: The prostate is unremarkable. Other: No evidence of abdominal wall mass or hernia. Musculoskeletal: No acute or significant osseous findings. IMPRESSION: Findings which could be suggestive acute cholecystitis. Small amount abdominopelvic ascites. Electronically Signed   By: Prudencio Pair M.D.   On: 04/05/2019 01:36   US Abdomen Limited Ruq  Result Date: 04/05/2019 CLINICAL DATA:  Pain lower mid abdomen.  Abnormal LFTs. EXAM: ULTRASOUND ABDOMEN LIMITED RIGHT UPPER QUADRANT COMPARISON:  CT 04/05/2019 FINDINGS: Gallbladder: No evidence of cholelithiasis or sludge. Negative sonographic Murphy sign. No adjacent free fluid. Slight nonspecific gallbladder wall thickening measuring 4.9 mm. Common bile duct: Diameter: 3.8 mm.  No  definite ductal stones. Liver: No focal lesion identified. Within normal limits in parenchymal echogenicity. Portal vein is patent on color Doppler imaging with normal direction of blood flow towards the liver. Other: Small amount of perihepatic fluid. IMPRESSION: Mild nonspecific gallbladder wall thickening without additional evidence to suggest acute cholecystitis. Small amount of perihepatic fluid. Electronically Signed   By: Marin Olp M.D.   On: 04/05/2019 08:55    ROS:  Pertinent items are noted in HPI.  Blood pressure 121/79, pulse 98, temperature 99 F (37.2 C), temperature source Oral, resp. rate 16, height 5' 8.5" (1.74 m), weight  86.4 kg, SpO2 96 %. Physical Exam: Pleasant well-developed well-nourished white male no acute distress Head is normocephalic, atraumatic Eyes without scleral icterus Lungs are clear to auscultation with good breath sounds bilaterally Heart examination reveals regular rate and rhythm without S3, S4, murmurs Abdomen is soft with minimal discomfort to palpation in the left lower quadrant.  No right upper quadrant abdominal pain is noted.  No rigidity is noted.  CT scan images personally reviewed  Assessment/Plan: Impression: Transaminitis of unknown etiology.  Patient has no biliary colic symptoms.  Ultrasound was negative for cholelithiasis.  Patient may have passed a stone.  That being said, no need for acute surgical intervention at this time.  Discussed with Dr. Wynetta Emery.  Will get hepatitis panel.  Should the patient become symptomatic, may need HIDA scan for further evaluation.  Will repeat liver enzyme tests in a.m.  Patient may start on clear liquid diet.  Molasses enema ordered for constipation.  Aviva Signs 04/05/2019, 9:15 AM

## 2019-04-05 NOTE — ED Notes (Signed)
Patient transported to CT 

## 2019-04-05 NOTE — ED Provider Notes (Signed)
[facility] EMERGENCY DEPARTMENT Provider Note   CSN: [id number] Arrival date & time: 04/04/19  2313     History   Chief Complaint Chief Complaint  Patient presents with  . Abdominal Pain    HPI Jason Mcmahon is a 53 y.o. male.     Patient presents to the emergency department for evaluation of abdominal pain.  Patient has been experiencing intermittent crampy pain of the lower abdomen for the last 3 or 4 days.  No associated nausea, vomiting, diarrhea.  He has, however, felt constipated for the last week.  This is unusual for him.  He states that this feels similar to when he had diverticulitis a couple of years ago.  He has not had any fever.  No rectal bleeding or melena.     Past Medical History:  Diagnosis Date  . Gout   . Hypertension     Patient Active Problem List   Diagnosis Date Noted  . UTI (lower urinary tract infection) 06/25/2012  . Sepsis ([place]) 06/25/2012  . Tachycardia 06/25/2012    Past Surgical History:  Procedure Laterality Date  . arm surgery    . COLONOSCOPY N/A 02/28/2016   Procedure: COLONOSCOPY;  Surgeon: Aviva Signs, MD;  Location: [facility];  Service: Gastroenterology;  Laterality: N/A;        Home Medications    Prior to Admission medications   Medication Sig Start Date End Date Taking? Authorizing Provider  amLODipine (NORVASC) 10 MG tablet  03/27/19   [provider]  lisinopril (ZESTRIL) 10 MG tablet 20 mg.  02/18/19   [provider]  lisinopril (ZESTRIL) 20 MG tablet Take 20 mg by mouth daily.    [provider]  ULORIC 80 MG TABS TAKE 1 TABLET BY MOUTH EVERY DAY 02/03/19   Sanjuana Kava, MD    Family History Family History  Problem Relation Age of Onset  . Liver disease Father   . Hypertension Father   . Cancer Father   . Seizures Paternal Aunt   . Diabetes Paternal Grandmother     Social History Social History   Tobacco Use  . Smoking status: Former Smoker    Packs/day: 3.00   Years: 18.00    Pack years: 54.00    Types: Cigarettes  . Smokeless tobacco: Never Used  Substance Use Topics  . Alcohol use: No  . Drug use: No     Allergies   Patient has no known allergies.   Review of Systems Review of Systems  Gastrointestinal: Positive for abdominal pain and constipation. Negative for blood in stool.  All other systems reviewed and are negative.    Physical Exam Updated Vital Signs BP 121/79   Pulse 98   Temp 99 F (37.2 C) (Oral)   Resp 16   Ht 5' 8.5" (1.74 m)   Wt 88 kg   SpO2 96%   BMI 29.07 kg/m   Physical Exam Vitals signs and nursing note reviewed.  Constitutional:      General: He is not in acute distress.    Appearance: Normal appearance. He is well-developed.  HENT:     Head: Normocephalic and atraumatic.     Right Ear: Hearing normal.     Left Ear: Hearing normal.     Nose: Nose normal.  Eyes:     Conjunctiva/sclera: Conjunctivae normal.     Pupils: Pupils are equal, round, and reactive to light.  Neck:     Musculoskeletal: Normal range of motion and neck  supple.  Cardiovascular:     Rate and Rhythm: Regular rhythm.     Heart sounds: S1 normal and S2 normal. No murmur. No friction rub. No gallop.   Pulmonary:     Effort: Pulmonary effort is normal. No respiratory distress.     Breath sounds: Normal breath sounds.  Chest:     Chest wall: No tenderness.  Abdominal:     General: Bowel sounds are normal.     Palpations: Abdomen is soft.     Tenderness: There is generalized abdominal tenderness. There is no guarding or rebound. Negative signs include Murphy's sign and McBurney's sign.     Hernia: No hernia is present.  Musculoskeletal: Normal range of motion.  Skin:    General: Skin is warm and dry.     Findings: No rash.  Neurological:     Mental Status: He is alert and oriented to person, place, and time.     GCS: GCS eye subscore is 4. GCS verbal subscore is 5. GCS motor subscore is 6.     Cranial Nerves: No cranial  nerve deficit.     Sensory: No sensory deficit.     Coordination: Coordination normal.  Psychiatric:        Speech: Speech normal.        Behavior: Behavior normal.        Thought Content: Thought content normal.      ED Treatments / Results  Labs (all labs ordered are listed, but only abnormal results are displayed) Labs Reviewed  CBC WITH DIFFERENTIAL/PLATELET - Abnormal; Notable for the following components:      Result Value   Platelets 139 (*)    All other components within normal limits  COMPREHENSIVE METABOLIC PANEL - Abnormal; Notable for the following components:   Sodium 134 (*)    Glucose, Bld 100 (*)    Calcium 8.8 (*)    AST 219 (*)    ALT 342 (*)    Alkaline Phosphatase 180 (*)    Total Bilirubin 2.0 (*)    All other components within normal limits  SARS CORONAVIRUS 2 (HOSPITAL ORDER, [place] LAB)  LIPASE, BLOOD  URINALYSIS, ROUTINE W REFLEX MICROSCOPIC    EKG None  Radiology Ct Abdomen Pelvis W Contrast  Result Date: 04/05/2019 CLINICAL DATA:  Abdominal pain, question of diverticulitis EXAM: CT ABDOMEN AND PELVIS WITH CONTRAST TECHNIQUE: Multidetector CT imaging of the abdomen and pelvis was performed using the standard protocol following bolus administration of intravenous contrast. CONTRAST:  123mL OMNIPAQUE IOHEXOL 300 MG/ML  SOLN COMPARISON:  January 30, 2018 FINDINGS: Lower chest: The visualized heart size within normal limits. No pericardial fluid/thickening. No hiatal hernia. Streaky atelectasis or scarring seen at both lung bases. Hepatobiliary: The liver is normal in density without focal abnormality.The main portal vein is patent. There is hyperenhancement of the gallbladder wall with surrounding fat stranding changes. Tiny small lymph nodes are seen within the right upper quadrant. No definite gallstones are seen. Pancreas: There is mild fatty atrophy noted within the pancreas. No pancreatic ductal dilatation. Spleen: Normal in  size without focal abnormality. Adrenals/Urinary Tract: Both adrenal glands appear normal. The kidneys and collecting system appear normal without evidence of urinary tract calculus or hydronephrosis. Bladder is unremarkable. Stomach/Bowel: The stomach, small bowel, and colon are normal in appearance. No inflammatory changes, wall thickening, or obstructive findings.The appendix is normal. Vascular/Lymphatic: There are no enlarged mesenteric, retroperitoneal, or pelvic lymph nodes. A small amount of bilateral colic gutter  and pelvic ascites is seen. There is also a small amount of perihepatic ascites. Scattered aortic atherosclerotic calcifications are seen without aneurysmal dilatation. Reproductive: The prostate is unremarkable. Other: No evidence of abdominal wall mass or hernia. Musculoskeletal: No acute or significant osseous findings. IMPRESSION: Findings which could be suggestive acute cholecystitis. Small amount abdominopelvic ascites. Electronically Signed   By: Prudencio Pair M.D.   On: 04/05/2019 01:36    Procedures Procedures (including critical care time)  Medications Ordered in ED Medications  cefTRIAXone (ROCEPHIN) 2 g in sodium chloride 0.9 % 100 mL IVPB (2 g Intravenous New Bag/Given 04/05/19 0228)  sodium chloride 0.9 % bolus 500 mL (0 mLs Intravenous Stopped 04/05/19 0159)  iohexol (OMNIPAQUE) 300 MG/ML solution 100 mL (100 mLs Intravenous Contrast Given 04/05/19 0120)     Initial Impression / Assessment and Plan / ED Course  I have reviewed the triage vital signs and the nursing notes.  Pertinent labs & imaging results that were available during my care of the patient were reviewed by me and considered in my medical decision making (see chart for details).       Patient presents to the emergency department for evaluation of abdominal pain.  Patient experiencing progressively worsening pain for the last several days.  He has felt constipated as well.  Patient reports a previous  history of diverticulitis and thinks that he might have a recurrence.  Examination revealed diffuse tenderness.  No signs of acute peritonitis at this time.  LFTs abnormal.  CT scan performed, does show suspected acute cholecystitis, however no gallstones seen.  Discussed with Dr. Arnoldo Morale, on-call for surgery.  Recommends admission to medicine, will perform ultrasound in the morning to further delineate.  Empiric antibiotics administered.  Final Clinical Impressions(s) / ED Diagnoses   Final diagnoses:  Cholecystitis    ED Discharge Orders    None       Keyler Hoge, Gwenyth Allegra, MD 04/05/19 [phone]

## 2019-04-06 DIAGNOSIS — I10 Essential (primary) hypertension: Secondary | ICD-10-CM

## 2019-04-06 DIAGNOSIS — R945 Abnormal results of liver function studies: Secondary | ICD-10-CM | POA: Diagnosis not present

## 2019-04-06 DIAGNOSIS — R1084 Generalized abdominal pain: Secondary | ICD-10-CM

## 2019-04-06 LAB — CBC WITH DIFFERENTIAL/PLATELET
Abs Immature Granulocytes: 0.01 10*3/uL (ref 0.00–0.07)
Basophils Absolute: 0 10*3/uL (ref 0.0–0.1)
Basophils Relative: 0 %
Eosinophils Absolute: 0.1 10*3/uL (ref 0.0–0.5)
Eosinophils Relative: 3 %
HCT: 38.3 % — ABNORMAL LOW (ref 39.0–52.0)
Hemoglobin: 12.5 g/dL — ABNORMAL LOW (ref 13.0–17.0)
Immature Granulocytes: 0 %
Lymphocytes Relative: 32 %
Lymphs Abs: 1 10*3/uL (ref 0.7–4.0)
MCH: 31.6 pg (ref 26.0–34.0)
MCHC: 32.6 g/dL (ref 30.0–36.0)
MCV: 96.7 fL (ref 80.0–100.0)
Monocytes Absolute: 0.4 10*3/uL (ref 0.1–1.0)
Monocytes Relative: 11 %
Neutro Abs: 1.7 10*3/uL (ref 1.7–7.7)
Neutrophils Relative %: 54 %
Platelets: 129 10*3/uL — ABNORMAL LOW (ref 150–400)
RBC: 3.96 MIL/uL — ABNORMAL LOW (ref 4.22–5.81)
RDW: 12.8 % (ref 11.5–15.5)
WBC: 3.1 10*3/uL — ABNORMAL LOW (ref 4.0–10.5)
nRBC: 0 % (ref 0.0–0.2)

## 2019-04-06 LAB — COMPREHENSIVE METABOLIC PANEL
ALT: 267 U/L — ABNORMAL HIGH (ref 0–44)
AST: 174 U/L — ABNORMAL HIGH (ref 15–41)
Albumin: 3.2 g/dL — ABNORMAL LOW (ref 3.5–5.0)
Alkaline Phosphatase: 139 U/L — ABNORMAL HIGH (ref 38–126)
Anion gap: 8 (ref 5–15)
BUN: 9 mg/dL (ref 6–20)
CO2: 23 mmol/L (ref 22–32)
Calcium: 8.6 mg/dL — ABNORMAL LOW (ref 8.9–10.3)
Chloride: 106 mmol/L (ref 98–111)
Creatinine, Ser: 1.11 mg/dL (ref 0.61–1.24)
GFR calc Af Amer: >60 mL/min (ref >60)
GFR calc non Af Amer: >60 mL/min (ref >60)
Glucose, Bld: 85 mg/dL (ref 70–99)
Potassium: 3.6 mmol/L (ref 3.5–5.1)
Sodium: 137 mmol/L (ref 135–145)
Total Bilirubin: 1.8 mg/dL — ABNORMAL HIGH (ref 0.3–1.2)
Total Protein: 6.9 g/dL (ref 6.5–8.1)

## 2019-04-06 LAB — HEPATITIS PANEL, ACUTE
HCV Ab: NONREACTIVE
Hep A IgM: NONREACTIVE
Hep B C IgM: NONREACTIVE
Hepatitis B Surface Ag: NONREACTIVE

## 2019-04-06 NOTE — Plan of Care (Signed)

## 2019-04-06 NOTE — Progress Notes (Signed)
Subjective: Patient has no abdominal pain, nausea, or vomiting.  Is hungry.  Objective: Vital signs in last 24 hours: Temp:  [98.4 F (36.9 C)-99 F (37.2 C)] 98.4 F (36.9 C) (10/05 0527) Pulse Rate:  [73-93] 73 (10/05 0527) Resp:  [16-18] 16 (10/05 0527) BP: (129-145)/(71-86) 129/80 (10/05 0527) SpO2:  [93 %-97 %] 93 % (10/05 0527) Last BM Date: 04/05/19  Intake/Output from previous day: 10/04 0701 - 10/05 0700 In: 3796.6 [P.O.:1440; I.V.:2356.6] Out: 2 [Urine:2] Intake/Output this shift: No intake/output data recorded.  General appearance: alert, cooperative and no distress GI: soft, non-tender; bowel sounds normal; no masses,  no organomegaly  Lab Results:  Recent Labs    04/05/19 0024 04/06/19 0528  WBC 4.1 3.1*  HGB 13.5 12.5*  HCT 40.8 38.3*  PLT 139* 129*   BMET Recent Labs    04/05/19 0024 04/06/19 0528  NA 134* 137  K 3.6 3.6  CL 105 106  CO2 22 23  GLUCOSE 100* 85  BUN 15 9  CREATININE 1.18 1.11  CALCIUM 8.8* 8.6*   PT/INR No results for input(s): LABPROT, INR in the last 72 hours.  Studies/Results: Ct Abdomen Pelvis W Contrast  Result Date: 04/05/2019 CLINICAL DATA:  Abdominal pain, question of diverticulitis EXAM: CT ABDOMEN AND PELVIS WITH CONTRAST TECHNIQUE: Multidetector CT imaging of the abdomen and pelvis was performed using the standard protocol following bolus administration of intravenous contrast. CONTRAST:  160mL OMNIPAQUE IOHEXOL 300 MG/ML  SOLN COMPARISON:  January 30, 2018 FINDINGS: Lower chest: The visualized heart size within normal limits. No pericardial fluid/thickening. No hiatal hernia. Streaky atelectasis or scarring seen at both lung bases. Hepatobiliary: The liver is normal in density without focal abnormality.The main portal vein is patent. There is hyperenhancement of the gallbladder wall with surrounding fat stranding changes. Tiny small lymph nodes are seen within the right upper quadrant. No definite gallstones are seen.  Pancreas: There is mild fatty atrophy noted within the pancreas. No pancreatic ductal dilatation. Spleen: Normal in size without focal abnormality. Adrenals/Urinary Tract: Both adrenal glands appear normal. The kidneys and collecting system appear normal without evidence of urinary tract calculus or hydronephrosis. Bladder is unremarkable. Stomach/Bowel: The stomach, small bowel, and colon are normal in appearance. No inflammatory changes, wall thickening, or obstructive findings.The appendix is normal. Vascular/Lymphatic: There are no enlarged mesenteric, retroperitoneal, or pelvic lymph nodes. A small amount of bilateral colic gutter and pelvic ascites is seen. There is also a small amount of perihepatic ascites. Scattered aortic atherosclerotic calcifications are seen without aneurysmal dilatation. Reproductive: The prostate is unremarkable. Other: No evidence of abdominal wall mass or hernia. Musculoskeletal: No acute or significant osseous findings. IMPRESSION: Findings which could be suggestive acute cholecystitis. Small amount abdominopelvic ascites. Electronically Signed   By: Prudencio Pair M.D.   On: 04/05/2019 01:36   US Abdomen Limited Ruq  Result Date: 04/05/2019 CLINICAL DATA:  Pain lower mid abdomen.  Abnormal LFTs. EXAM: ULTRASOUND ABDOMEN LIMITED RIGHT UPPER QUADRANT COMPARISON:  CT 04/05/2019 FINDINGS: Gallbladder: No evidence of cholelithiasis or sludge. Negative sonographic Murphy sign. No adjacent free fluid. Slight nonspecific gallbladder wall thickening measuring 4.9 mm. Common bile duct: Diameter: 3.8 mm.  No definite ductal stones. Liver: No focal lesion identified. Within normal limits in parenchymal echogenicity. Portal vein is patent on color Doppler imaging with normal direction of blood flow towards the liver. Other: Small amount of perihepatic fluid. IMPRESSION: Mild nonspecific gallbladder wall thickening without additional evidence to suggest acute cholecystitis. Small amount of  perihepatic  fluid. Electronically Signed   By: Marin Olp M.D.   On: 04/05/2019 08:55    Anti-infectives: Anti-infectives (From admission, onward)   Start     Dose/Rate Route Frequency Ordered Stop   04/05/19 2000  cefTRIAXone (ROCEPHIN) 2 g in sodium chloride 0.9 % 100 mL IVPB  Status:  Discontinued     2 g 200 mL/hr over 30 Minutes Intravenous Every 24 hours 04/05/19 0420 04/05/19 0914   04/05/19 0330  metroNIDAZOLE (FLAGYL) IVPB 500 mg     500 mg 100 mL/hr over 60 Minutes Intravenous  Once 04/05/19 0324 04/05/19 0425   04/05/19 0200  cefTRIAXone (ROCEPHIN) 2 g in sodium chloride 0.9 % 100 mL IVPB     2 g 200 mL/hr over 30 Minutes Intravenous  Once 04/05/19 0159 04/05/19 0308      Assessment/Plan: Impression: Transaminitis is resolving.  Ultrasound was negative for cholelithiasis.  As patient is asymptomatic, no need for cholecystectomy at this time.  Will advance to regular diet.  Should he tolerate this, he can be discharged.  Follow-up with primary care physician concerning elevated liver enzyme tests.  LOS: 1 day    Aviva Signs 04/06/2019

## 2019-04-06 NOTE — Discharge Summary (Addendum)
Physician Discharge Summary  Jason Mcmahon [id number] DOB: [birth year] DOA: 04/04/2019  PCP: Cory Munch, PA-C  Admit date: 04/04/2019 Discharge date: 04/06/2019  Admitted From:  Home  Disposition: Home   Recommendations for Outpatient Follow-up:  1. Follow up with PCP in 1 weeks 2. Please recheck liver enzymes in 1 week  Discharge Condition: STABLE   CODE STATUS: FULL    Brief Hospitalization Summary: Please see all hospital notes, images, labs for full details of the hospitalization. HPI: 52-year man from community with past medical history of hypertension presented to the ED with complaint of abdominal pain of 3 to 4 days duration.  As per patient he has been having lower abdominal pain intermittent crampy in nature for the past 3 to 4 days.  He describes pain similar to the pain that he had when he was diagnosed with diverticulitis 2 years ago.  Endorses to constipation but no complaint of nausea, vomiting, diarrhea, hematochezia, melena, fever and chills.  No sick contacts or travel history.  ED Course:  In the ED at the time of presentation patient was mildly hypertensive saturating 97% on room air with no tachypnea with T-max 99, labs with mild hyponatremia 134 BUN 15 creatinine 1.18, liver chemistries AST 219 ALT 342, bilirubin 2.2, no leukocytosis H&H stable with hemoglobin 13.5, urine unremarkable, CT scan impressive of hyperenhancement of the gallbladder wall with surrounding fat stranding changes concerning for cholecystitis ultrasound abdomen pending.  Patient was started on ceftriaxone 2 g of metronidazole 500 mg every 8 hourly, admitted for management of acute cholecystitis surgery was called from the ED will evaluate the patient in the morning.  Patient was admitted for observation he remained n.p.o. initially and started on a clear liquid diet after his abdominal pain seemed to resolve on its own.  He was seen by general surgery they evaluated him and we were able to  advance his diet.  We repeated his liver enzymes which showed improvement and downtrending of LFTs.  His abdominal pain has resolved.  He is requesting to advance his diet.  We have started him on a heart healthy diet and he is tolerated this very well with no abdominal pain symptoms no nausea or vomiting or diarrhea.  Surgery saw him this morning and said that if he tolerated his diet he can discharge home and follow-up outpatient with his PCP to monitor his LFTs and work them up further.  He had a negative acute hepatitis panel that was done in the hospital.  His imaging studies have been unrevealing.  None of his findings are suggestive of acute cholecystitis.  Discharge Diagnoses:  Principal Problem:   Acute cholecystitis Active Problems:   Abdominal pain   Abnormal LFTs   Essential hypertension   Cholecystitis   Discharge Instructions:  Allergies as of 04/06/2019   No Known Allergies     Medication List    TAKE these medications   amLODipine 10 MG tablet Commonly known as: NORVASC   lisinopril 20 MG tablet Commonly known as: ZESTRIL Take 20 mg by mouth daily.   Uloric 80 MG Tabs Generic drug: Febuxostat TAKE 1 TABLET BY MOUTH EVERY DAY      Follow-up Information    Cory Munch, PA-C. Schedule an appointment as soon as possible for a visit on 04/15/2019.   Specialties: Physician Assistant, Internal Medicine Why: Follow up elevated liver enzymes 9:45 Contact information: [address] [place] [address] [phone]  No Known Allergies Allergies as of 04/06/2019   No Known Allergies     Medication List    TAKE these medications   amLODipine 10 MG tablet Commonly known as: NORVASC   lisinopril 20 MG tablet Commonly known as: ZESTRIL Take 20 mg by mouth daily.   Uloric 80 MG Tabs Generic drug: Febuxostat TAKE 1 TABLET BY MOUTH EVERY DAY       Procedures/Studies: Ct Abdomen Pelvis W Contrast  Result Date:  04/05/2019 CLINICAL DATA:  Abdominal pain, question of diverticulitis EXAM: CT ABDOMEN AND PELVIS WITH CONTRAST TECHNIQUE: Multidetector CT imaging of the abdomen and pelvis was performed using the standard protocol following bolus administration of intravenous contrast. CONTRAST:  182mL OMNIPAQUE IOHEXOL 300 MG/ML  SOLN COMPARISON:  January 30, 2018 FINDINGS: Lower chest: The visualized heart size within normal limits. No pericardial fluid/thickening. No hiatal hernia. Streaky atelectasis or scarring seen at both lung bases. Hepatobiliary: The liver is normal in density without focal abnormality.The main portal vein is patent. There is hyperenhancement of the gallbladder wall with surrounding fat stranding changes. Tiny small lymph nodes are seen within the right upper quadrant. No definite gallstones are seen. Pancreas: There is mild fatty atrophy noted within the pancreas. No pancreatic ductal dilatation. Spleen: Normal in size without focal abnormality. Adrenals/Urinary Tract: Both adrenal glands appear normal. The kidneys and collecting system appear normal without evidence of urinary tract calculus or hydronephrosis. Bladder is unremarkable. Stomach/Bowel: The stomach, small bowel, and colon are normal in appearance. No inflammatory changes, wall thickening, or obstructive findings.The appendix is normal. Vascular/Lymphatic: There are no enlarged mesenteric, retroperitoneal, or pelvic lymph nodes. A small amount of bilateral colic gutter and pelvic ascites is seen. There is also a small amount of perihepatic ascites. Scattered aortic atherosclerotic calcifications are seen without aneurysmal dilatation. Reproductive: The prostate is unremarkable. Other: No evidence of abdominal wall mass or hernia. Musculoskeletal: No acute or significant osseous findings. IMPRESSION: Findings which could be suggestive acute cholecystitis. Small amount abdominopelvic ascites. Electronically Signed   By: Prudencio Pair M.D.   On:  04/05/2019 01:36   US Abdomen Limited Ruq  Result Date: 04/05/2019 CLINICAL DATA:  Pain lower mid abdomen.  Abnormal LFTs. EXAM: ULTRASOUND ABDOMEN LIMITED RIGHT UPPER QUADRANT COMPARISON:  CT 04/05/2019 FINDINGS: Gallbladder: No evidence of cholelithiasis or sludge. Negative sonographic Murphy sign. No adjacent free fluid. Slight nonspecific gallbladder wall thickening measuring 4.9 mm. Common bile duct: Diameter: 3.8 mm.  No definite ductal stones. Liver: No focal lesion identified. Within normal limits in parenchymal echogenicity. Portal vein is patent on color Doppler imaging with normal direction of blood flow towards the liver. Other: Small amount of perihepatic fluid. IMPRESSION: Mild nonspecific gallbladder wall thickening without additional evidence to suggest acute cholecystitis. Small amount of perihepatic fluid. Electronically Signed   By: Marin Olp M.D.   On: 04/05/2019 08:55     Subjective: Patient tolerated regular diet with no abdominal pain or abdominal symptoms.  He is feeling much better and would like to go home.  Discharge Exam: Vitals:   04/05/19 2122 04/06/19 0527  BP: (!) 145/86 129/80  Pulse: 82 73  Resp: 16 16  Temp: 99 F (37.2 C) 98.4 F (36.9 C)  SpO2: 93% 93%   Vitals:   04/05/19 1029 04/05/19 1426 04/05/19 2122 04/06/19 0527  BP: 133/72 133/71 (!) 145/86 129/80  Pulse: 93 79 82 73  Resp: 18 16 16 16   Temp:  98.7 F (37.1 C) 99 F (37.2 C) 98.4 F (  36.9 C)  TempSrc:  Oral Oral Oral  SpO2: 93% 97% 93% 93%  Weight:      Height:       General: Pt is alert, awake, not in acute distress Cardiovascular: RRR, S1/S2 +, no rubs, no gallops Respiratory: CTA bilaterally, no wheezing, no rhonchi Abdominal: Soft, NT, ND, bowel sounds + Extremities: no edema, no cyanosis   The results of significant diagnostics from this hospitalization (including imaging, microbiology, ancillary and laboratory) are listed below for reference.     Microbiology: Recent  Results (from the past 240 hour(s))  SARS Coronavirus 2 [facility] order, Performed in [facility] hospital lab) Nasopharyngeal Nasopharyngeal Swab     Status: None   Collection Time: 04/05/19  2:25 AM   Specimen: Nasopharyngeal Swab  Result Value Ref Range Status   SARS Coronavirus 2 NEGATIVE NEGATIVE Final    Comment: (NOTE) If result is NEGATIVE SARS-CoV-2 target nucleic acids are NOT DETECTED. The SARS-CoV-2 RNA is generally detectable in upper and lower  respiratory specimens during the acute phase of infection. The lowest  concentration of SARS-CoV-2 viral copies this assay can detect is 250  copies / mL. A negative result does not preclude SARS-CoV-2 infection  and should not be used as the sole basis for treatment or other  patient management decisions.  A negative result may occur with  improper specimen collection / handling, submission of specimen other  than nasopharyngeal swab, presence of viral mutation(s) within the  areas targeted by this assay, and inadequate number of viral copies  (<250 copies / mL). A negative result must be combined with clinical  observations, patient history, and epidemiological information. If result is POSITIVE SARS-CoV-2 target nucleic acids are DETECTED. The SARS-CoV-2 RNA is generally detectable in upper and lower  respiratory specimens dur ing the acute phase of infection.  Positive  results are indicative of active infection with SARS-CoV-2.  Clinical  correlation with patient history and other diagnostic information is  necessary to determine patient infection status.  Positive results do  not rule out bacterial infection or co-infection with other viruses. If result is PRESUMPTIVE POSTIVE SARS-CoV-2 nucleic acids MAY BE PRESENT.   A presumptive positive result was obtained on the submitted specimen  and confirmed on repeat testing.  While 2019 novel coronavirus  (SARS-CoV-2) nucleic acids may be present in the submitted sample   additional confirmatory testing may be necessary for epidemiological  and / or clinical management purposes  to differentiate between  SARS-CoV-2 and other Sarbecovirus currently known to infect humans.  If clinically indicated additional testing with an alternate test  methodology [phone]) is advised. The SARS-CoV-2 RNA is generally  detectable in upper and lower respiratory sp ecimens during the acute  phase of infection. The expected result is Negative. Fact Sheet for Patients:  StrictlyIdeas.no Fact Sheet for Healthcare Providers: BankingDealers.co.za This test is not yet approved or cleared by the [place] FDA and has been authorized for detection and/or diagnosis of SARS-CoV-2 by FDA under an Emergency Use Authorization (EUA).  This EUA will remain in effect (meaning this test can be used) for the duration of the COVID-19 declaration under Section 564(b)(1) of the Act, 21 U.S.C. section 360bbb-3(b)(1), unless the authorization is terminated or revoked sooner. Performed at [facility], [address] [place], [place] [address]      Labs: BNP (last 3 results) No results for input(s): BNP in the last 8760 hours. Basic Metabolic Panel: Recent Labs  Lab 04/05/19 0024 04/06/19 [id number]  NA 134* 137  K 3.6 3.6  CL 105 106  CO2 22 23  GLUCOSE 100* 85  BUN 15 9  CREATININE 1.18 1.11  CALCIUM 8.8* 8.6*   Liver Function Tests: Recent Labs  Lab 04/05/19 0024 04/06/19 0528  AST 219* 174*  ALT 342* 267*  ALKPHOS 180* 139*  BILITOT 2.0* 1.8*  PROT 7.6 6.9  ALBUMIN 3.7 3.2*   Recent Labs  Lab 04/05/19 0024  LIPASE 48   No results for input(s): AMMONIA in the last 168 hours. CBC: Recent Labs  Lab 04/05/19 0024 04/06/19 0528  WBC 4.1 3.1*  NEUTROABS 2.3 1.7  HGB 13.5 12.5*  HCT 40.8 38.3*  MCV 96.0 96.7  PLT 139* 129*   Cardiac Enzymes: No results for input(s): CKTOTAL, CKMB, CKMBINDEX, TROPONINI in the last  168 hours. BNP: Invalid input(s): POCBNP CBG: No results for input(s): GLUCAP in the last 168 hours. D-Dimer No results for input(s): DDIMER in the last 72 hours. Hgb A1c No results for input(s): HGBA1C in the last 72 hours. Lipid Profile No results for input(s): CHOL, HDL, LDLCALC, TRIG, CHOLHDL, LDLDIRECT in the last 72 hours. Thyroid function studies No results for input(s): TSH, T4TOTAL, T3FREE, THYROIDAB in the last 72 hours.  Invalid input(s): FREET3 Anemia work up No results for input(s): VITAMINB12, FOLATE, FERRITIN, TIBC, IRON, RETICCTPCT in the last 72 hours. Urinalysis    Component Value Date/Time   COLORURINE YELLOW 04/05/2019 0120   APPEARANCEUR CLEAR 04/05/2019 0120   LABSPEC 1.006 04/05/2019 0120   PHURINE 6.0 04/05/2019 0120   GLUCOSEU NEGATIVE 04/05/2019 0120   HGBUR NEGATIVE 04/05/2019 0120   BILIRUBINUR NEGATIVE 04/05/2019 0120   KETONESUR NEGATIVE 04/05/2019 0120   PROTEINUR NEGATIVE 04/05/2019 0120   UROBILINOGEN 1.0 06/24/2012 2245   NITRITE NEGATIVE 04/05/2019 0120   LEUKOCYTESUR NEGATIVE 04/05/2019 0120   Sepsis Labs Invalid input(s): PROCALCITONIN,  WBC,  LACTICIDVEN Microbiology Recent Results (from the past 240 hour(s))  SARS Coronavirus 2 [facility] order, Performed in [facility] hospital lab) Nasopharyngeal Nasopharyngeal Swab     Status: None   Collection Time: 04/05/19  2:25 AM   Specimen: Nasopharyngeal Swab  Result Value Ref Range Status   SARS Coronavirus 2 NEGATIVE NEGATIVE Final    Comment: (NOTE) If result is NEGATIVE SARS-CoV-2 target nucleic acids are NOT DETECTED. The SARS-CoV-2 RNA is generally detectable in upper and lower  respiratory specimens during the acute phase of infection. The lowest  concentration of SARS-CoV-2 viral copies this assay can detect is 250  copies / mL. A negative result does not preclude SARS-CoV-2 infection  and should not be used as the sole basis for treatment or other  patient management decisions.   A negative result may occur with  improper specimen collection / handling, submission of specimen other  than nasopharyngeal swab, presence of viral mutation(s) within the  areas targeted by this assay, and inadequate number of viral copies  (<250 copies / mL). A negative result must be combined with clinical  observations, patient history, and epidemiological information. If result is POSITIVE SARS-CoV-2 target nucleic acids are DETECTED. The SARS-CoV-2 RNA is generally detectable in upper and lower  respiratory specimens dur ing the acute phase of infection.  Positive  results are indicative of active infection with SARS-CoV-2.  Clinical  correlation with patient history and other diagnostic information is  necessary to determine patient infection status.  Positive results do  not rule out bacterial infection or co-infection with other viruses. If result is PRESUMPTIVE POSTIVE  SARS-CoV-2 nucleic acids MAY BE PRESENT.   A presumptive positive result was obtained on the submitted specimen  and confirmed on repeat testing.  While 2019 novel coronavirus  (SARS-CoV-2) nucleic acids may be present in the submitted sample  additional confirmatory testing may be necessary for epidemiological  and / or clinical management purposes  to differentiate between  SARS-CoV-2 and other Sarbecovirus currently known to infect humans.  If clinically indicated additional testing with an alternate test  methodology [phone]) is advised. The SARS-CoV-2 RNA is generally  detectable in upper and lower respiratory sp ecimens during the acute  phase of infection. The expected result is Negative. Fact Sheet for Patients:  StrictlyIdeas.no Fact Sheet for Healthcare Providers: BankingDealers.co.za This test is not yet approved or cleared by the [place] FDA and has been authorized for detection and/or diagnosis of SARS-CoV-2 by FDA under an Emergency Use  Authorization (EUA).  This EUA will remain in effect (meaning this test can be used) for the duration of the COVID-19 declaration under Section 564(b)(1) of the Act, 21 U.S.C. section 360bbb-3(b)(1), unless the authorization is terminated or revoked sooner. Performed at [facility], [address], [place], [place] [address]    Time coordinating discharge:   SIGNED:  Irwin Brakeman, MD  [facility] 04/06/2019, 11:51 AM How to contact the [facility] Attending or Consulting provider [place] or covering provider during after hours [place], for this patient?  1. Check the care team in [facility] and look for a) attending/consulting [facility] provider listed and b) the [facility] team listed 2. Log into [email or web address] and use [place] universal password to access. If you do not have the password, please contact the hospital operator. 3. Locate the [facility] provider you are looking for under [facility] and page to a number that you can be directly reached. 4. If you still have difficulty reaching the provider, please page the [facility] (Director on Call) for the Hospitalists listed on amion for assistance.

## 2019-04-06 NOTE — Discharge Instructions (Signed)
Gallbladder Eating Plan If you have a gallbladder condition, you may have trouble digesting fats. Eating a low-fat diet can help reduce your symptoms, and may be helpful before and after having surgery to remove your gallbladder (cholecystectomy). Your health care provider may recommend that you work with a diet and nutrition specialist (dietitian) to help you reduce the amount of fat in your diet. What are tips for following this plan? General guidelines  Limit your fat intake to less than 30% of your total daily calories. If you eat around 1,800 calories each day, this is less than 60 grams (g) of fat per day.  Fat is an important part of a healthy diet. Eating a low-fat diet can make it hard to maintain a healthy body weight. Ask your dietitian how much fat, calories, and other nutrients you need each day.  Eat small, frequent meals throughout the day instead of three large meals.  Drink at least 8-10 cups of fluid a day. Drink enough fluid to keep your urine clear or pale yellow.  Limit alcohol intake to no more than 1 drink a day for nonpregnant women and 2 drinks a day for men. One drink equals 12 oz of beer, 5 oz of wine, or 1 oz of hard liquor. Reading food labels  Check Nutrition Facts on food labels for the amount of fat per serving. Choose foods with less than 3 grams of fat per serving. Shopping  Choose nonfat and low-fat healthy foods. Look for the words nonfat, low fat, or fat free.  Avoid buying processed or prepackaged foods. Cooking  Cook using low-fat methods, such as baking, broiling, grilling, or boiling.  Cook with small amounts of healthy fats, such as olive oil, grapeseed oil, canola oil, or sunflower oil. What foods are recommended?   All fresh, frozen, or canned fruits and vegetables.  Whole grains.  Low-fat or non-fat (skim) milk and yogurt.  Lean meat, skinless poultry, fish, eggs, and beans.  Low-fat protein supplement powders or  drinks.  Spices and herbs. What foods are not recommended?  High-fat foods. These include baked goods, fast food, fatty cuts of meat, ice cream, french toast, sweet rolls, pizza, cheese bread, foods covered with butter, creamy sauces, or cheese.  Fried foods. These include french fries, tempura, battered fish, breaded chicken, fried breads, and sweets.  Foods with strong odors.  Foods that cause bloating and gas. Summary  A low-fat diet can be helpful if you have a gallbladder condition, or before and after gallbladder surgery.  Limit your fat intake to less than 30% of your total daily calories. This is about 60 g of fat if you eat 1,800 calories each day.  Eat small, frequent meals throughout the day instead of three large meals. This information is not intended to replace advice given to you by your health care provider. Make sure you discuss any questions you have with your health care provider. Document Released: 06/23/2013 Document Revised: 10/09/2018 Document Reviewed: 07/26/2016 Elsevier Patient Education  2020 [place].   IMPORTANT INFORMATION: PAY CLOSE ATTENTION   PHYSICIAN DISCHARGE INSTRUCTIONS  Follow with Primary care provider  Cory Munch, PA-C  and other consultants as instructed by your Hospitalist Physician  [place] IF SYMPTOMS COME BACK, WORSEN OR NEW PROBLEM DEVELOPS   Please note: You were cared for by a hospitalist during your hospital stay. Every effort will be made to forward records to your primary care provider.  You can  request that your primary care provider send for your hospital records if they have not received them.  Once you are discharged, your primary care physician will handle any further medical issues. Please note that NO REFILLS for any discharge medications will be authorized once you are discharged, as it is imperative that you return to your primary care physician (or establish a relationship  with a primary care physician if you do not have one) for your post hospital discharge needs so that they can reassess your need for medications and monitor your lab values.  Please get a complete blood count and chemistry panel checked by your Primary MD at your next visit, and again as instructed by your Primary MD.  Get Medicines reviewed and adjusted: Please take all your medications with you for your next visit with your Primary MD  Laboratory/radiological data: Please request your Primary MD to go over all hospital tests and procedure/radiological results at the follow up, please ask your primary care provider to get all Hospital records sent to his/her office.  In some cases, they will be blood work, cultures and biopsy results pending at the time of your discharge. Please request that your primary care provider follow up on these results.  If you are diabetic, please bring your blood sugar readings with you to your follow up appointment with primary care.    Please call and make your follow up appointments as soon as possible.    Also Note the following: If you experience worsening of your admission symptoms, develop shortness of breath, life threatening emergency, suicidal or homicidal thoughts you must seek medical attention immediately by calling 911 or calling your MD immediately  if symptoms less severe.  You must read complete instructions/literature along with all the possible adverse reactions/side effects for all the Medicines you take and that have been prescribed to you. Take any new Medicines after you have completely understood and accpet all the possible adverse reactions/side effects.   Do not drive when taking Pain medications or sleeping medications (Benzodiazepines)  Do not take more than prescribed Pain, Sleep and Anxiety Medications. It is not advisable to combine anxiety,sleep and pain medications without talking with your primary care practitioner  Special  Instructions: If you have smoked or chewed Tobacco  in the last 2 yrs please stop smoking, stop any regular Alcohol  and or any Recreational drug use.  Wear Seat belts while driving.  Do not drive if taking any narcotic, mind altering or controlled substances or recreational drugs or alcohol.

## 2019-04-09 ENCOUNTER — Encounter (HOSPITAL_COMMUNITY): Payer: Self-pay

## 2019-04-09 ENCOUNTER — Ambulatory Visit (HOSPITAL_COMMUNITY): Payer: 59

## 2019-04-30 ENCOUNTER — Encounter: Payer: Self-pay | Admitting: Internal Medicine

## 2019-05-14 ENCOUNTER — Ambulatory Visit (INDEPENDENT_AMBULATORY_CARE_PROVIDER_SITE_OTHER): Payer: 59 | Admitting: Nurse Practitioner

## 2019-05-14 ENCOUNTER — Other Ambulatory Visit: Payer: Self-pay

## 2019-05-14 ENCOUNTER — Encounter: Payer: Self-pay | Admitting: Nurse Practitioner

## 2019-05-14 VITALS — BP 141/86 | HR 73 | Temp 97.0°F | Ht 68.5 in | Wt 181.2 lb

## 2019-05-14 DIAGNOSIS — R1011 Right upper quadrant pain: Secondary | ICD-10-CM | POA: Diagnosis not present

## 2019-05-14 DIAGNOSIS — R945 Abnormal results of liver function studies: Secondary | ICD-10-CM | POA: Diagnosis not present

## 2019-05-14 DIAGNOSIS — R7989 Other specified abnormal findings of blood chemistry: Secondary | ICD-10-CM

## 2019-05-14 DIAGNOSIS — R1084 Generalized abdominal pain: Secondary | ICD-10-CM

## 2019-05-14 NOTE — Patient Instructions (Signed)
No PA needed for HIDA per [facility] website: This member's plan does not currently require notification or prior-authorization through the [employer] Notification or Prior-Authorization Program.

## 2019-05-14 NOTE — Progress Notes (Signed)
Primary Care Physician:  Ginger Organ Primary Gastroenterologist:  Dr. Gala Romney  Chief Complaint  Patient presents with  . elevated LFT's  . Abdominal Pain    lower/upper, comes/goes    HPI:   Jason Mcmahon is a 53 y.o. male who presents with referral from primary care for elevated LFTs.  Reviewed information provided with referral including labs completed 04/13/2019.  Creatinine was noted to be a little high at 1.68, bilirubin 1.3, AST/ALT 190/260, normal albumin.  Hemoglobin mildly low at 12.5.  Labs prior to that arm 04/06/2019 found creatinine 1.11, bilirubin 1.8, AST/ALT 174/265.  The patient had a colonoscopy on 02/28/2016 by Dr. Aviva Signs of surgery.  Findings included mild diverticulosis of the sigmoid colon, a single 2 mm polyp in the proximal ascending colon, a single 8 mm polyp in the distal sigmoid colon, otherwise normal.  Surgical pathology found the polyps to be tubular adenoma.  Recommended repeat colonoscopy in 3 years (2020).  The patient was recently admitted to the hospital from 04/04/2019 through 04/06/2019.  He presented with abdominal pain for 3 to 4 days.  LFTs were elevated with AST/ALT at 219/342, bilirubin 2.2, hemoglobin stable at 13.5.  CT scan consistent with cholecystitis.  Improvement in LFTs by discharge.  Abdominal pain resolved.  Recommended monitor LFTs primary care.  Right upper quadrant ultrasound completed 04/05/2019 found mild nonspecific gallbladder wall thickening without additional evidence to suggest acute cholecystitis.  CBD diameter normal at 3.8.  CMP completed at discharge shows AST/ALT at 174/267, alkaline phosphatase improved to 139, bilirubin improved to 1.8.  HIV negative.  Acute hepatitis panel negative.  Today he states he's doing ok overall. Stopped taking Amlodipine under direction of PCP query that as an etiology of his elevated LFTs. Has been on Uloric for "years." Has never been told his LFTs were elevated. In hospital was  told his kidney function was a little abnormal. Feels this all occurred after his seizure. Denies yellowing skin/eyes. Admits dark yellow (but not brown) urine just before hospital admission but is better now. Still with abdominal pain generalized abdomen, intermittent, sharp, typically lasts a couple hours. Cannot name triggers of his abdominal pain. Cannot think of anything that helps his abdominal pain. Also notes night sweats which is new since seizure, has informed PCP. Denies N/V. Admits "sometimes" hematochezia, when asked how often states "once in a while." Has been ongoing for years. Thinks it's due to hard stools. Has a bowel movement daily, consistent with [place] 4. Has a hard stool about once a week and this is typically when he has some blood noted; stool often just on the tissue, sometimes in the commode. Denies melena, fever, chills, unintentional weight loss. Has lost 14 lbs in the past couple months with a change in diet. Denies URI or flu-like symptoms. Denies loss of sense of taste or smell. Denies chest pain, dyspnea, dizziness, lightheadedness, syncope, near syncope. Denies any other upper or lower GI symptoms.  Denies alcohol intake and Tylenol intake.  Past Medical History:  Diagnosis Date  . Gout   . Hypertension     Past Surgical History:  Procedure Laterality Date  . arm surgery    . COLONOSCOPY N/A 02/28/2016   Procedure: COLONOSCOPY;  Surgeon: Aviva Signs, MD;  Location: [facility];  Service: Gastroenterology;  Laterality: N/A;    Current Outpatient Medications  Medication Sig Dispense Refill  . lisinopril (ZESTRIL) 20 MG tablet Take 20 mg by mouth daily.    Marland Kitchen  ULORIC 80 MG TABS TAKE 1 TABLET BY MOUTH EVERY DAY 30 tablet 3   No current facility-administered medications for this visit.     Allergies as of 05/14/2019  . (No Known Allergies)    Family History  Problem Relation Age of Onset  . Liver disease Father        Alcoholic  . Hypertension Father    . Cancer Father   . Seizures Paternal Aunt   . Diabetes Paternal Grandmother   . Colon cancer Neg Hx     Social History   Socioeconomic History  . Marital status: Married    Spouse name: Not on file  . Number of children: Not on file  . Years of education: Not on file  . Highest education level: Not on file  Occupational History  . Not on file  Social Needs  . Financial resource strain: Not on file  . Food insecurity    Worry: Not on file    Inability: Not on file  . Transportation needs    Medical: Not on file    Non-medical: Not on file  Tobacco Use  . Smoking status: Former Smoker    Packs/day: 3.00    Years: 18.00    Pack years: 54.00    Types: Cigarettes  . Smokeless tobacco: Never Used  Substance and Sexual Activity  . Alcohol use: No  . Drug use: No  . Sexual activity: Not on file  Lifestyle  . Physical activity    Days per week: Not on file    Minutes per session: Not on file  . Stress: Not on file  Relationships  . Social Herbalist on phone: Not on file    Gets together: Not on file    Attends religious service: Not on file    Active member of club or organization: Not on file    Attends meetings of clubs or organizations: Not on file    Relationship status: Not on file  . Intimate partner violence    Fear of current or ex partner: Not on file    Emotionally abused: Not on file    Physically abused: Not on file    Forced sexual activity: Not on file  Other Topics Concern  . Not on file  Social History Narrative  . Not on file    Review of Systems: General: Negative for anorexia, weight loss, fever, chills, fatigue, weakness. ENT: Negative for hoarseness, difficulty swallowing. CV: Negative for chest pain, angina, palpitations, peripheral edema.  Respiratory: Negative for dyspnea at rest, cough, sputum, wheezing.  GI: See history of present illness. MS: Negative for joint pain, low back pain.  Derm: Negative for rash or itching.   Endo: Negative for unusual weight change.  Heme: Negative for bruising or bleeding. Allergy: Negative for rash or hives.    Physical Exam: BP (!) 141/86   Pulse 73   Temp (!) 97 F (36.1 C) (Oral)   Ht 5' 8.5" (1.74 m)   Wt 181 lb 3.2 oz (82.2 kg)   BMI 27.15 kg/m  General:   Alert and oriented. Pleasant and cooperative. Well-nourished and well-developed.  Head:  Normocephalic and atraumatic. Eyes:  Without icterus, sclera clear and conjunctiva pink.  Ears:  Normal auditory acuity. Cardiovascular:  S1, S2 present without murmurs appreciated. Extremities without clubbing or edema. Respiratory:  Clear to auscultation bilaterally. No wheezes, rales, or rhonchi. No distress.  Gastrointestinal:  +BS, soft, non-tender and non-distended. No HSM noted.  No guarding or rebound. No masses appreciated.  Rectal:  Deferred  Musculoskalatal:  Symmetrical without gross deformities. Neurologic:  Alert and oriented x4;  grossly normal neurologically. Psych:  Alert and cooperative. Normal mood and affect. Heme/Lymph/Immune: No excessive bruising noted.    05/14/2019 8:28 AM   Disclaimer: This note was dictated with voice recognition software. Similar sounding words can inadvertently be transcribed and may not be corrected upon review.

## 2019-05-14 NOTE — Assessment & Plan Note (Signed)
Persistent abdominal pain.  He is unable to suggest triggers.  He did have an slightly abnormal gallbladder on his ultrasound.  Rare constipation with a hard stool maybe once a week, otherwise normal stools and empties completely so I doubt a significant contribution from constipation.  At this point we will check a HIDA scan to see if there is biliary disease contributing.  Follow-up in 6 to 8 weeks.

## 2019-05-14 NOTE — Assessment & Plan Note (Addendum)
The patient has persistently elevated LFTs.  This was first noted during his hospital admission for abdominal pain with an abnormal gallbladder on ultrasound imaging.  No CBD dilation or signs of CBD obstruction or choledocholithiasis.  Acute hepatitis panel was negative.  At this point I will check other labs including CBC, BMP, HFP, ANA, AMA, anti-smooth muscle antibody.  Further recommendations to follow lab results.  Hepatic function panel will help suggest whether this is hepatocellular disease versus cholestasis.  Follow-up in 6 to 8 weeks.

## 2019-05-14 NOTE — Patient Instructions (Signed)
Your health issues we discussed today were:   Abdominal pain: 1. If you have more frequent hard stools or worsening constipation, you can start daily over-the-counter stool softeners (Colace 100 mg) 2. I have put in an order for a HIDA scan to evaluate your gallbladder function 3. Call us if you have any worsening or severe symptoms  Elevated liver enzymes: 1. I have put in orders to further evaluate your abnormal liver enzymes 2. Have your labs drawn soon as you can 3. We will call you when we get the results 4. Further recommendations to follow  Overall I recommend:  1. Continue your other current medications 2. Return for follow-up in 6 to 8 weeks 3. Call us if you have any questions or concerns.   Because of recent events of COVID-19 ("Coronavirus"), follow CDC recommendations:  1. Wash your hand frequently 2. Avoid touching your face 3. Stay away from people who are sick 4. If you have symptoms such as fever, cough, shortness of breath then call your healthcare provider for further guidance 5. If you are sick, STAY AT HOME unless otherwise directed by your healthcare provider. 6. Follow directions from state and national officials regarding staying safe   At [facility] we value your feedback. You may receive a survey about your visit today. Please share your experience as we strive to create trusting relationships with our patients to provide genuine, compassionate, quality care.  We appreciate your understanding and patience as we review any laboratory studies, imaging, and other diagnostic tests that are ordered as we care for you. Our office policy is 5 business days for review of these results, and any emergent or urgent results are addressed in a timely manner for your best interest. If you do not hear from our office in 1 week, please contact us.   We also encourage the use of MyChart, which contains your medical information for your review as well. If you  are not enrolled in this feature, an access code is on this after visit summary for your convenience. Thank you for allowing [place] to be involved in your care.  It was great to see you today!  I hope you have a Happy Thanksgiving!!

## 2019-05-18 ENCOUNTER — Telehealth: Payer: Self-pay

## 2019-05-18 MED ORDER — FEBUXOSTAT 80 MG PO TABS: 1.0000 | ORAL_TABLET | Freq: Every day | ORAL | 5 refills | Status: DC

## 2019-05-18 NOTE — Telephone Encounter (Signed)
Uloric 80 mg  Qty 30 Tablets  PATIENT [facility] faxed a drug change request for this to be changed. Saw in chart where this was discussed with Estill Bamberg at the pharmacy to dispense as written because patient will be paying out of pocket. I spoke with Monica Martinez just now and relayed this message again.

## 2019-05-19 LAB — HEPATIC FUNCTION PANEL
AG Ratio: 0.9 (calc) — ABNORMAL LOW (ref 1.0–2.5)
ALT: 257 U/L — ABNORMAL HIGH (ref 9–46)
AST: 175 U/L — ABNORMAL HIGH (ref 10–35)
Albumin: 4 g/dL (ref 3.6–5.1)
Alkaline phosphatase (APISO): 165 U/L — ABNORMAL HIGH (ref 35–144)
Bilirubin, Direct: 0.4 mg/dL — ABNORMAL HIGH (ref 0.0–0.2)
Globulin: 4.6 g/dL (calc) — ABNORMAL HIGH (ref 1.9–3.7)
Indirect Bilirubin: 0.7 mg/dL (calc) (ref 0.2–1.2)
Total Bilirubin: 1.1 mg/dL (ref 0.2–1.2)
Total Protein: 8.6 g/dL — ABNORMAL HIGH (ref 6.1–8.1)

## 2019-05-19 LAB — CBC WITH DIFFERENTIAL/PLATELET
Absolute Monocytes: 425 cells/uL (ref 200–950)
Basophils Absolute: 20 cells/uL (ref 0–200)
Basophils Relative: 0.4 %
Eosinophils Absolute: 100 cells/uL (ref 15–500)
Eosinophils Relative: 2 %
HCT: 42.5 % (ref 38.5–50.0)
Hemoglobin: 14.2 g/dL (ref 13.2–17.1)
Lymphs Abs: 1430 cells/uL (ref 850–3900)
MCH: 31.4 pg (ref 27.0–33.0)
MCHC: 33.4 g/dL (ref 32.0–36.0)
MCV: 94 fL (ref 80.0–100.0)
MPV: 9.1 fL (ref 7.5–12.5)
Monocytes Relative: 8.5 %
Neutro Abs: 3025 cells/uL (ref 1500–7800)
Neutrophils Relative %: 60.5 %
Platelets: 171 10*3/uL (ref 140–400)
RBC: 4.52 10*6/uL (ref 4.20–5.80)
RDW: 13.7 % (ref 11.0–15.0)
Total Lymphocyte: 28.6 %
WBC: 5 10*3/uL (ref 3.8–10.8)

## 2019-05-19 LAB — BASIC METABOLIC PANEL WITH GFR
BUN: 15 mg/dL (ref 7–25)
CO2: 27 mmol/L (ref 20–32)
Calcium: 9.7 mg/dL (ref 8.6–10.3)
Chloride: 104 mmol/L (ref 98–110)
Creat: 1.05 mg/dL (ref 0.70–1.33)
Glucose, Bld: 72 mg/dL (ref 65–139)
Potassium: 4.6 mmol/L (ref 3.5–5.3)
Sodium: 138 mmol/L (ref 135–146)

## 2019-05-19 LAB — ANA: Anti Nuclear Antibody (ANA): NEGATIVE

## 2019-05-19 LAB — ANTI-SMOOTH MUSCLE ANTIBODY, IGG: Actin (Smooth Muscle) Antibody (IGG): 65 U — ABNORMAL HIGH (ref <20)

## 2019-05-19 LAB — MITOCHONDRIAL ANTIBODIES: Mitochondrial M2 Ab, IgG: 41.4 U — ABNORMAL HIGH

## 2019-05-21 ENCOUNTER — Other Ambulatory Visit: Payer: Self-pay

## 2019-05-21 ENCOUNTER — Encounter (HOSPITAL_COMMUNITY): Payer: Self-pay

## 2019-05-21 ENCOUNTER — Encounter (HOSPITAL_COMMUNITY)
Admission: RE | Admit: 2019-05-21 | Discharge: 2019-05-21 | Disposition: A | Discharge status: Home / Self Care | Payer: 59 | Source: Ambulatory Visit | Attending: Nurse Practitioner | Admitting: Nurse Practitioner

## 2019-05-21 DIAGNOSIS — R7989 Other specified abnormal findings of blood chemistry: Secondary | ICD-10-CM

## 2019-05-21 DIAGNOSIS — R945 Abnormal results of liver function studies: Secondary | ICD-10-CM | POA: Diagnosis present

## 2019-05-21 DIAGNOSIS — R1011 Right upper quadrant pain: Secondary | ICD-10-CM | POA: Diagnosis not present

## 2019-05-21 DIAGNOSIS — R1084 Generalized abdominal pain: Secondary | ICD-10-CM | POA: Insufficient documentation

## 2019-05-21 MED ORDER — TECHNETIUM TC 99M MEBROFENIN IV KIT
5.0000 | PACK | Freq: Once | INTRAVENOUS | Status: AC | PRN
  Administered 2019-05-21: 5.1 via INTRAVENOUS

## 2019-05-25 ENCOUNTER — Other Ambulatory Visit: Payer: Self-pay

## 2019-05-25 DIAGNOSIS — R7989 Other specified abnormal findings of blood chemistry: Secondary | ICD-10-CM

## 2019-05-25 DIAGNOSIS — R945 Abnormal results of liver function studies: Secondary | ICD-10-CM

## 2019-06-03 ENCOUNTER — Other Ambulatory Visit: Payer: Self-pay | Admitting: Student

## 2019-06-04 ENCOUNTER — Other Ambulatory Visit: Payer: Self-pay

## 2019-06-04 ENCOUNTER — Ambulatory Visit (HOSPITAL_COMMUNITY)
Admission: RE | Admit: 2019-06-04 | Discharge: 2019-06-04 | Disposition: A | Discharge status: Home / Self Care | Payer: 59 | Source: Ambulatory Visit | Attending: Nurse Practitioner | Admitting: Nurse Practitioner

## 2019-06-04 DIAGNOSIS — R945 Abnormal results of liver function studies: Secondary | ICD-10-CM | POA: Diagnosis present

## 2019-06-04 DIAGNOSIS — R7989 Other specified abnormal findings of blood chemistry: Secondary | ICD-10-CM

## 2019-06-04 LAB — CBC
HCT: 41 % (ref 39.0–52.0)
Hemoglobin: 13.8 g/dL (ref 13.0–17.0)
MCH: 32.8 pg (ref 26.0–34.0)
MCHC: 33.7 g/dL (ref 30.0–36.0)
MCV: 97.4 fL (ref 80.0–100.0)
Platelets: 117 10*3/uL — ABNORMAL LOW (ref 150–400)
RBC: 4.21 MIL/uL — ABNORMAL LOW (ref 4.22–5.81)
RDW: 14.8 % (ref 11.5–15.5)
WBC: 4.2 10*3/uL (ref 4.0–10.5)
nRBC: 0 % (ref 0.0–0.2)

## 2019-06-04 LAB — COMPREHENSIVE METABOLIC PANEL
ALT: 82 U/L — ABNORMAL HIGH (ref 0–44)
AST: 52 U/L — ABNORMAL HIGH (ref 15–41)
Albumin: 3.4 g/dL — ABNORMAL LOW (ref 3.5–5.0)
Alkaline Phosphatase: 94 U/L (ref 38–126)
Anion gap: 7 (ref 5–15)
BUN: 13 mg/dL (ref 6–20)
CO2: 26 mmol/L (ref 22–32)
Calcium: 9 mg/dL (ref 8.9–10.3)
Chloride: 105 mmol/L (ref 98–111)
Creatinine, Ser: 1.07 mg/dL (ref 0.61–1.24)
GFR calc Af Amer: >60 mL/min (ref >60)
GFR calc non Af Amer: >60 mL/min (ref >60)
Glucose, Bld: 85 mg/dL (ref 70–99)
Potassium: 4.2 mmol/L (ref 3.5–5.1)
Sodium: 138 mmol/L (ref 135–145)
Total Bilirubin: 1.1 mg/dL (ref 0.3–1.2)
Total Protein: 7 g/dL (ref 6.5–8.1)

## 2019-06-04 LAB — PROTIME-INR
INR: 1.1 (ref 0.8–1.2)
Prothrombin Time: 14 seconds (ref 11.4–15.2)

## 2019-06-04 MED ORDER — MIDAZOLAM HCL 2 MG/2ML IJ SOLN
INTRAMUSCULAR | Status: AC
  Filled 2019-06-04: qty 2

## 2019-06-04 MED ORDER — GELATIN ABSORBABLE 12-7 MM EX MISC
CUTANEOUS | Status: AC
  Filled 2019-06-04: qty 1

## 2019-06-04 MED ORDER — MIDAZOLAM HCL 2 MG/2ML IJ SOLN
INTRAMUSCULAR | Status: AC | PRN
  Administered 2019-06-04 (×2): 1 mg via INTRAVENOUS

## 2019-06-04 MED ORDER — SODIUM CHLORIDE 0.9 % IV SOLN: INTRAVENOUS | Status: DC

## 2019-06-04 MED ORDER — LIDOCAINE-EPINEPHRINE 1 %-1:100000 IJ SOLN
INTRAMUSCULAR | Status: AC
  Filled 2019-06-04: qty 1

## 2019-06-04 MED ORDER — FENTANYL CITRATE (PF) 100 MCG/2ML IJ SOLN
INTRAMUSCULAR | Status: AC
  Filled 2019-06-04: qty 2

## 2019-06-04 MED ORDER — FENTANYL CITRATE (PF) 100 MCG/2ML IJ SOLN
INTRAMUSCULAR | Status: AC | PRN
  Administered 2019-06-04 (×2): 50 ug via INTRAVENOUS

## 2019-06-04 NOTE — Discharge Instructions (Signed)
Liver Biopsy, Care After  These instructions give you information on caring for yourself after your procedure. Your doctor may also give you more specific instructions. Call your doctor if you have any problems or questions after your procedure.  What can I expect after the procedure?  After the procedure, it is common to have:  · Pain and soreness where the biopsy was done.  · Bruising around the area where the biopsy was done.  · Sleepiness and be tired for a few days.  Follow these instructions at home:  Medicines  · Take over-the-counter and prescription medicines only as told by your doctor.  · If you were prescribed an antibiotic medicine, take it as told by your doctor. Do not stop taking the antibiotic even if you start to feel better.  · Do not take medicines such as aspirin and ibuprofen. These medicines can thin your blood. Do not take these medicines unless your doctor tells you to take them.  · If you are taking prescription pain medicine, take actions to prevent or treat constipation. Your doctor may recommend that you:  ? Drink enough fluid to keep your pee (urine) clear or pale yellow.  ? Take over-the-counter or prescription medicines.  ? Eat foods that are high in fiber, such as fresh fruits and vegetables, whole grains, and beans.  ? Limit foods that are high in fat and processed sugars, such as fried and sweet foods.  Caring for your cut  · Follow instructions from your doctor about how to take care of your cuts from surgery (incisions). Make sure you:  ? Wash your hands with soap and water before you change your bandage (dressing). If you cannot use soap and water, use hand sanitizer.  ? Change your bandage as told by your doctor.  ? Leave stitches (sutures), skin glue, or skin tape (adhesive) strips in place. They may need to stay in place for 2 weeks or longer. If tape strips get loose and curl up, you may trim the loose edges. Do not remove tape strips completely unless your doctor says it is  okay.  · Check your cuts every day for signs of infection. Check for:  ? Redness, swelling, or more pain.  ? Fluid or blood.  ? Pus or a bad smell.  ? Warmth.  · Do not take baths, swim, or use a hot tub until your doctor says it is okay to do so.  Activity    · Rest at home for 1-2 days or as told by your doctor.  ? Avoid sitting for a long time without moving. Get up to take short walks every 1-2 hours.  · Return to your normal activities as told by your doctor. Ask what activities are safe for you.  · Do not do these things in the first 24 hours:  ? Drive.  ? Use machinery.  ? Take a bath or shower.  · Do not lift more than 10 pounds (4.5 kg) or play contact sports for the first 2 weeks.  General instructions    · Do not drink alcohol in the first week after the procedure.  · Have someone stay with you for at least 24 hours after the procedure.  · Get your test results. Ask your doctor or the department that is doing the test:  ? When will my results be ready?  ? How will I get my results?  ? What are my treatment options?  ? What other tests do   I need?  ? What are my next steps?  · Keep all follow-up visits as told by your doctor. This is important.  Contact a doctor if:  · A cut bleeds and leaves more than just a small spot of blood.  · A cut is red, puffs up (swells), or hurts more than before.  · Fluid or something else comes from a cut.  · A cut smells bad.  · You have a fever or chills.  Get help right away if:  · You have swelling, bloating, or pain in your belly (abdomen).  · You get dizzy or faint.  · You have a rash.  · You feel sick to your stomach (nauseous) or throw up (vomit).  · You have trouble breathing, feel short of breath, or feel faint.  · Your chest hurts.  · You have problems talking or seeing.  · You have trouble with your balance or moving your arms or legs.  Summary  · After the procedure, it is common to have pain, soreness, bruising, and tiredness.  · Your doctor will tell you how to  take care of yourself at home. Change your bandage, take your medicines, and limit your activities as told by your doctor.  · Call your doctor if you have symptoms of infection. Get help right away if your belly swells, your cut bleeds a lot, or you have trouble talking or breathing.  This information is not intended to replace advice given to you by your health care provider. Make sure you discuss any questions you have with your health care provider.  Document Released: 03/27/2008 Document Revised: 06/28/2017 Document Reviewed: 06/28/2017  Elsevier Patient Education © 2020 [employer]

## 2019-06-04 NOTE — Consult Note (Addendum)
Chief Complaint: Patient was seen in consultation today for image guided random core liver biopsy  Referring Physician(s): Rourk,M/Gill,E NP  Supervising Physician: Sandi Mariscal  Patient Status: [facility] - Out-pt  History of Present Illness: Jason Mcmahon is a 53 y.o. male with history of hypertension, abdominal pain, elevated LFTs with elevated anti smooth muscle antibody and positive mitochondrial antibodies concerning for possible PBC who presents today for image guided random core liver biopsy for further evaluation.  Ultrasound of abdomen on 04/05/19 revealed mild nonspecific gallbladder wall thickening and small amount of perihepatic fluid.  He had negative nuclear medicine hepatobiliary imaging on 11/19.  Past Medical History:  Diagnosis Date  . Gout   . Hypertension     Past Surgical History:  Procedure Laterality Date  . arm surgery    . COLONOSCOPY N/A 02/28/2016   Procedure: COLONOSCOPY;  Surgeon: Aviva Signs, MD;  Location: [facility];  Service: Gastroenterology;  Laterality: N/A;    Allergies: Patient has no known allergies.  Medications: Prior to Admission medications   Medication Sig Start Date End Date Taking? Authorizing Provider  Febuxostat (ULORIC) 80 MG TABS Take 1 tablet (80 mg total) by mouth daily. 05/18/19  Yes Sanjuana Kava, MD  lisinopril (ZESTRIL) 20 MG tablet Take 20 mg by mouth daily.   Yes [provider]     Family History  Problem Relation Age of Onset  . Liver disease Father        Alcoholic  . Hypertension Father   . Cancer Father   . Seizures Paternal Aunt   . Diabetes Paternal Grandmother   . Colon cancer Neg Hx     Social History   Socioeconomic History  . Marital status: Married    Spouse name: Not on file  . Number of children: Not on file  . Years of education: Not on file  . Highest education level: Not on file  Occupational History  . Not on file  Social Needs  . Financial resource strain: Not on file   . Food insecurity    Worry: Not on file    Inability: Not on file  . Transportation needs    Medical: Not on file    Non-medical: Not on file  Tobacco Use  . Smoking status: Former Smoker    Packs/day: 3.00    Years: 18.00    Pack years: 54.00    Types: Cigarettes  . Smokeless tobacco: Never Used  Substance and Sexual Activity  . Alcohol use: No  . Drug use: No  . Sexual activity: Not on file  Lifestyle  . Physical activity    Days per week: Not on file    Minutes per session: Not on file  . Stress: Not on file  Relationships  . Social Herbalist on phone: Not on file    Gets together: Not on file    Attends religious service: Not on file    Active member of club or organization: Not on file    Attends meetings of clubs or organizations: Not on file    Relationship status: Not on file  Other Topics Concern  . Not on file  Social History Narrative  . Not on file      Review of Systems currently denies fever, headache, chest pain, dyspnea, cough, back pain, nausea, vomiting or bleeding.  Has had some recent abdominal pain  Vital Signs: BP (!) 142/100   Pulse (!) 57   Temp 97.7 F (  36.5 C) (Skin)   Resp 18   Ht 5' 8.5" (1.74 m)   Wt 170 lb (77.1 kg)   SpO2 99%   BMI 25.47 kg/m   Physical Exam awake, alert.  Chest clear to auscultation bilaterally.  Heart with bradycardic but regular rhythm.  Abdomen soft, positive bowel sounds, minimal tenderness to palpation.  No lower extremity edema.  Imaging: Nm Hepato W/eject Fract  Result Date: 05/21/2019 CLINICAL DATA:  Right upper quadrant pain EXAM: NUCLEAR MEDICINE HEPATOBILIARY IMAGING WITH GALLBLADDER EF VIEWS: Anterior, right lateral right upper quadrant RADIOPHARMACEUTICALS:  5.1 mCi Tc-87m  Choletec IV COMPARISON:  None. FINDINGS: Liver uptake of radiotracer is unremarkable. There is visualization of gallbladder and small bowel, indicating patency of the cystic and common bile ducts. The patient  consumed 8 ounces of Ensure orally with calculation of the computer generated ejection fraction of radiotracer from the gallbladder. Patient experienced mild abdominal pain with the oral Ensure consumption. The computer generated ejection fraction of radiotracer from the gallbladder is within normal limits at 40%, normal greater than 33% using the oral agent. IMPRESSION: Ejection fraction of radiotracer the gallbladder is within normal limits. Patient did experience mild abdominal pain with the oral Ensure consumption. Cystic and common bile ducts are patent as is evidenced by visualization of gallbladder and small bowel. Electronically Signed   By: Lowella Grip III M.D.   On: 05/21/2019 11:19    Labs:  CBC: Recent Labs    04/05/19 0024 04/06/19 0528 05/14/19 0936  WBC 4.1 3.1* 5.0  HGB 13.5 12.5* 14.2  HCT 40.8 38.3* 42.5  PLT 139* 129* 171    COAGS: No results for input(s): INR, APTT in the last 8760 hours.  BMP: Recent Labs    01/29/19 1316 04/05/19 0024 04/06/19 0528 05/14/19 0936  NA 144 134* 137 138  K 4.1 3.6 3.6 4.6  CL 104 105 106 104  CO2 24 22 23 27   GLUCOSE 72 100* 85 72  BUN 13 15 9 15   CALCIUM 9.5 8.8* 8.6* 9.7  CREATININE 1.23 1.18 1.11 1.05  GFRNONAA 67 >60 >60  --   GFRAA 77 >60 >60  --     LIVER FUNCTION TESTS: Recent Labs    01/29/19 1316 04/05/19 0024 04/06/19 0528 05/14/19 0936  BILITOT 1.0 2.0* 1.8* 1.1  AST 32 219* 174* 175*  ALT 46* 342* 267* 257*  ALKPHOS 79 180* 139*  --   PROT 7.1 7.6 6.9 8.6*  ALBUMIN 4.6 3.7 3.2*  --     TUMOR MARKERS: No results for input(s): AFPTM, CEA, CA199, CHROMGRNA in the last 8760 hours.  Assessment and Plan: 53 y.o. male with history of hypertension, abdominal pain, elevated LFTs with elevated anti smooth muscle antibody and positive mitochondrial antibodies concerning for possible PBC who presents today for image guided random core liver biopsy for further evaluation.  Ultrasound of abdomen on  04/05/19 revealed mild nonspecific gallbladder wall thickening and small amount of perihepatic fluid.  He had negative nuclear medicine hepatobiliary imaging on 11/19.Risks and benefits of procedure was discussed with the patient  including, but not limited to bleeding, infection, damage to adjacent structures or low yield requiring additional tests.  All of the questions were answered and there is agreement to proceed.  Consent signed and in chart.  LABS PENDING   Thank you for this interesting consult.  I greatly enjoyed meeting Jason Mcmahon and look forward to participating in their care.  A copy of this report was  sent to the requesting provider on this date.  Electronically Signed: D. Rowe Robert, PA-C 06/04/2019, 7:56 AM   I spent a total of 25 minutes   in face to face in clinical consultation, greater than 50% of which was counseling/coordinating care for image guided random core liver biopsy

## 2019-06-04 NOTE — Procedures (Signed)
Pre Procedure Dx: Elevated LFTs Post Procedural Dx: Same  Technically successful US guided biopsy of right lobe of the liver.  EBL: None  No immediate complications.   Jay Sukhmani Fetherolf, MD Pager #: [phone]    

## 2019-06-05 ENCOUNTER — Other Ambulatory Visit: Payer: Self-pay | Admitting: Physician Assistant

## 2019-06-08 ENCOUNTER — Other Ambulatory Visit: Payer: Self-pay

## 2019-06-09 LAB — SURGICAL PATHOLOGY

## 2019-06-30 ENCOUNTER — Ambulatory Visit (INDEPENDENT_AMBULATORY_CARE_PROVIDER_SITE_OTHER): Payer: 59 | Admitting: Nurse Practitioner

## 2019-06-30 ENCOUNTER — Other Ambulatory Visit: Payer: Self-pay

## 2019-06-30 ENCOUNTER — Encounter: Payer: Self-pay | Admitting: Nurse Practitioner

## 2019-06-30 DIAGNOSIS — K743 Primary biliary cirrhosis: Secondary | ICD-10-CM | POA: Diagnosis not present

## 2019-06-30 MED ORDER — URSODIOL 500 MG PO TABS: 500.0000 mg | ORAL_TABLET | Freq: Two times a day (BID) | ORAL | 3 refills | Status: DC

## 2019-06-30 NOTE — Assessment & Plan Note (Signed)
The patient has had persistent and significant elevations of AST/ALT, alkaline phosphatase.  Also mild elevation of bilirubin.  Based on autoimmune serologies and random core liver biopsy the patient meets diagnostic criteria for primary biliary cholangitis.  We had a significant discussion today discussing the pathogenesis, progression, treatment options for PBC.  Overall he feels well.  No overt GI symptoms.  Based on the surgical pathology report he appears to be quite early on in the clinical course of his disease.  I will start him on Urso 13 to 15 mg/kg in divided twice daily doses.  Based on his weight this would be 1082 1246 total daily dose.  The closest we can get this is Urso Forte 500 mg twice daily.  We can consider changing the dose if he is not responsive.  I have also recommended to him that he get hepatitis A and B vaccines and will write a prescription for this.  Discussed continuing to avoid alcohol (he currently does not drink).  We will send a message to his primary care recommended monitoring for elevated cholesterol, consider DEXA scan for bone demineralization which can occur more commonly in patients with PBC.  He should also be checked for deficiencies and fat-soluble vitamins.   I have sent a prescription to his pharmacy.  I will have him follow-up in 3 months or we can recheck labs including vitamin D if this is not already been done.  He will likely need to have serological monitoring every 6 months, depending on his response to Urso.  If he is a nonresponder or ends up with complicated disease we can refer him to hepatology either in [place], [place], [place], or [place].

## 2019-06-30 NOTE — Progress Notes (Signed)
Referring Provider: Ginger Organ Primary Care Physician:  Cory Munch, PA-C Primary GI:  Dr. Gala Romney  Chief Complaint  Patient presents with  . Abdominal Pain    f/u, doing ok    HPI:   Jason Mcmahon is a 53 y.o. male who presents for follow-up on abdominal pain and elevated LFTs.  The patient was last seen in our office 05/14/2019 for right upper quadrant pain, abnormal LFTs, generalized abdominal pain.  Review of primary care labs prior to his last visit found mildly elevated creatinine 1.68, bilirubin 1.3, AST/ALT at 190/260, normal albumin.  Hemoglobin mildly low at 12.5.  Other previous labs on 04/06/2019, bilirubin 1.8, AST/ALT at 174/265.  Previous colonoscopy in 2017 with mild diverticulosis, single polyp in the ascending colon, single polyp in sigmoid colon found to be tubular adenoma and recommended repeat in 3 years (2020).  Recent hospital admission in October 2020 for 3 days with abdominal pain, elevated LFTs including AST/ALT at 219/342, bilirubin 2.2.  CT scan consistent with cholecystitis improvement in LFTs by discharge and pain resolved.  CMP at discharge showed AST/ALT at 174/267, alkaline phosphatase improved to 139, bilirubin improved to 1.8.  HIV negative, acute hepatitis panel negative. Recommended monitor LFTs at primary care.    Right upper quadrant ultrasound completed 04/05/2019 found mild nonspecific gallbladder wall thickening without additional evidence to suggest acute cholecystitis, CBD diameter normal at 3.8 mm.  At his last visit he noted he stopped taking amlodipine under the direction of his PCP as possible etiology for transaminitis.  Has been on Uloric "for years" and has never been told his LFTs are elevated.  No overt hepatic symptoms.  Still with abdominal pain that is in his generalized abdomen, intermittent, sharp, typically lasts a couple hours without known triggers or alleviators.  Is following with primary care related to his recent  seizure.  Notes he "sometimes" has hematochezia that has been ongoing for years that he thinks is due to constipation with a daily bowel movement [place] 4, although hard stool once a week which is when he typically notes blood just on the toilet tissue and sometimes in the commode.  14 pound weight loss in the last couple months with change in diet.  No other overt GI complaints.  Denies alcohol and Tylenol intake.  Recommended Colace 100 mg daily, HIDA scan, further labs, follow-up in 6 to 8 weeks.  HIDA scan completed 05/21/2019 found normal EF of the gallbladder although noted some mild abdominal pain with Ensure consumption.  Cystic and common bile ducts are patent.  Follow-up labs completed 05/14/2019 with CBC showing improved/normalized hemoglobin at 14.2.  Platelet count mildly low at 129/stable.  BMP was normal with creatinine noted at 1.05.  HFP found persistently elevated LFTs with AST/ALT 175/257, elevated alkaline phosphatase at 165, total bilirubin upper limit normal at 1.1 with elevated direct bilirubin of 0.4.  Albumin normal at 4.0.  ANA found to be negative, mitochondrial antibodies significantly elevated at 41.4, smooth muscle antibody IgG elevated at 65.  Overall findings are consistent with PBC although he is not classic presentation and recommended liver biopsy.  Liver biopsy completed 06/04/2019 which found findings that are compatible with PBC in the early stage with ductopenia especially with the patient's clinical finding of positive anterior antimitochondrial antibody test.  However, this is not diagnostic.  Possible overlapping autoimmune hepatitis or drug-induced liver injury with autoimmune features are also a consideration.  Meets diagnostic criteria for PBC (as per  below)  Today he states he's doing well overall. His liver biopsy went well. Denies abdominal pain, N/V, hematochezia, melena, fever, chills, unintentional weight loss. Denies yellowing of skin/eyes, generalized  pruritis. States his cholesterol has been pretty good recently. Never had a DEXA scan. Moves his bowels well. Denies URI or flu-like symptoms. Denies loss of sense of taste or smell. Denies chest pain, dyspnea, dizziness, lightheadedness, syncope, near syncope. Denies any other upper or lower GI symptoms.  PBC Diagnostic criteria (2 of 3 required):  1. An alkaline phosphatase at least 1.5 times the upper limit of normal: Yes (Alk Phos 180 on 04/05/2019) 2. Presence of antimitochondrial antibodies (AMA) at a titre of 1:40 or higher: Yes (41.4 "positive" on 05/14/2019) 3. Histologic evidence of PBC: Yes (see surgical pathology from random liver core biopsy on 06/04/2019)   Past Medical History:  Diagnosis Date  . Gout   . Hypertension   . Primary biliary cholangitis [facility])     Past Surgical History:  Procedure Laterality Date  . arm surgery    . COLONOSCOPY N/A 02/28/2016   Procedure: COLONOSCOPY;  Surgeon: Aviva Signs, MD;  Location: [facility];  Service: Gastroenterology;  Laterality: N/A;    Current Outpatient Medications  Medication Sig Dispense Refill  . Febuxostat (ULORIC) 80 MG TABS Take 1 tablet (80 mg total) by mouth daily. 30 tablet 5  . lisinopril (ZESTRIL) 20 MG tablet Take 20 mg by mouth daily.     No current facility-administered medications for this visit.    Allergies as of 06/30/2019  . (No Known Allergies)    Family History  Problem Relation Age of Onset  . Liver disease Father        Alcoholic  . Hypertension Father   . Cancer Father   . Seizures Paternal Aunt   . Diabetes Paternal Grandmother   . Colon cancer Neg Hx     Social History   Socioeconomic History  . Marital status: Married    Spouse name: Not on file  . Number of children: Not on file  . Years of education: Not on file  . Highest education level: Not on file  Occupational History  . Not on file  Tobacco Use  . Smoking status: Former Smoker    Packs/day: 3.00    Years: 18.00     Pack years: 54.00    Types: Cigarettes  . Smokeless tobacco: Never Used  Substance and Sexual Activity  . Alcohol use: No  . Drug use: No  . Sexual activity: Not on file  Other Topics Concern  . Not on file  Social History Narrative  . Not on file   Social Determinants of Health   Financial Resource Strain:   . Difficulty of Paying Living Expenses: Not on file  Food Insecurity:   . Worried About Charity fundraiser in the Last Year: Not on file  . Ran Out of Food in the Last Year: Not on file  Transportation Needs:   . Lack of Transportation (Medical): Not on file  . Lack of Transportation (Non-Medical): Not on file  Physical Activity:   . Days of Exercise per Week: Not on file  . Minutes of Exercise per Session: Not on file  Stress:   . Feeling of Stress : Not on file  Social Connections:   . Frequency of Communication with Friends and Family: Not on file  . Frequency of Social Gatherings with Friends and Family: Not on file  . Attends Religious  Services: Not on file  . Active Member of Clubs or Organizations: Not on file  . Attends Archivist Meetings: Not on file  . Marital Status: Not on file    Review of Systems: General: Negative for anorexia, weight loss, fever, chills, fatigue, weakness. ENT: Negative for hoarseness, difficulty swallowing. CV: Negative for chest pain, angina, palpitations, peripheral edema.  Respiratory: Negative for dyspnea at rest, cough, sputum, wheezing.  GI: See history of present illness. MS: Negative for joint pain, low back pain.  Derm: Negative for rash or itching.  Endo: Negative for unusual weight change.  Heme: Negative for bruising or bleeding. Allergy: Negative for rash or hives.   Physical Exam: BP (!) 156/88   Pulse 90   Temp (!) 96.8 F (36 C) (Temporal)   Ht 5' 8.5" (1.74 m)   Wt 183 lb 3.2 oz (83.1 kg)   BMI 27.45 kg/m  General:   Alert and oriented. Pleasant and cooperative. Well-nourished and  well-developed.  Eyes:  Without icterus, sclera clear and conjunctiva pink.  Ears:  Normal auditory acuity. Cardiovascular:  S1, S2 present without murmurs appreciated. Extremities without clubbing or edema. Respiratory:  Clear to auscultation bilaterally. No wheezes, rales, or rhonchi. No distress.  Gastrointestinal:  +BS, soft, non-tender and non-distended. No HSM noted. No guarding or rebound. No masses appreciated.  Rectal:  Deferred  Musculoskalatal:  Symmetrical without gross deformities. Neurologic:  Alert and oriented x4;  grossly normal neurologically. Psych:  Alert and cooperative. Normal mood and affect. Heme/Lymph/Immune: No excessive bruising noted.    06/30/2019 3:05 PM   Disclaimer: This note was dictated with voice recognition software. Similar sounding words can inadvertently be transcribed and may not be corrected upon review.

## 2019-06-30 NOTE — Patient Instructions (Signed)
Your health issues we discussed today were:   Primary biliary cholangitis (PBC) with elevated liver enzymes: 1. As we discussed, you meet diagnostic criteria for PBC 2. Being that you are early on in your disease, and we are starting treatment now, statistically studies have shown you should have a normal life span with a very low chance (7% or less) of developing significant scarring in your liver 3. I sent a Urso 500 mg to your pharmacy.  Take this twice a day. 4. You should get vaccinated for hepatitis a and B.  I will provide a written prescription for this.  You can contact your primary care to see if they offer it versus a pharmacy such as [facility] or [facility] 5. Avoid all alcohol intake 6. Keep Tylenol doses limited to 2000 mg a day or less 7. Discussed with your primary care provider about cholesterol monitoring and checking your bone density, which is more common in people with PBC.  We will also send a message to your primary care 8. Call us if you have any symptoms that are concerning or any problems with the Urso medication  Overall I recommend:  1. Continue your other current medications 2. Return for follow-up in 3 months 3. Call us if you have any questions or concerns   Because of recent events of COVID-19 ("Coronavirus"), follow CDC recommendations:  Wash your hand frequently Avoid touching your face Stay away from people who are sick If you have symptoms such as fever, cough, shortness of breath then call your healthcare provider for further guidance If you are sick, STAY AT HOME unless otherwise directed by your healthcare provider. Follow directions from state and national officials regarding staying safe   At [facility] we value your feedback. You may receive a survey about your visit today. Please share your experience as we strive to create trusting relationships with our patients to provide genuine, compassionate, quality care.  We appreciate your  understanding and patience as we review any laboratory studies, imaging, and other diagnostic tests that are ordered as we care for you. Our office policy is 5 business days for review of these results, and any emergent or urgent results are addressed in a timely manner for your best interest. If you do not hear from our office in 1 week, please contact us.   We also encourage the use of MyChart, which contains your medical information for your review as well. If you are not enrolled in this feature, an access code is on this after visit summary for your convenience. Thank you for allowing [place] to be involved in your care.  It was great to see you today!  I hope you have a Happy New Year!!

## 2019-07-01 ENCOUNTER — Encounter: Payer: Self-pay | Admitting: Internal Medicine

## 2019-07-13 ENCOUNTER — Ambulatory Visit (INDEPENDENT_AMBULATORY_CARE_PROVIDER_SITE_OTHER): Payer: 59 | Admitting: Family Medicine

## 2019-07-13 ENCOUNTER — Encounter: Payer: Self-pay | Admitting: Family Medicine

## 2019-07-13 ENCOUNTER — Other Ambulatory Visit: Payer: Self-pay

## 2019-07-13 VITALS — BP 166/101 | HR 62 | Temp 97.2°F | Ht 68.5 in | Wt 182.0 lb

## 2019-07-13 DIAGNOSIS — K743 Primary biliary cirrhosis: Secondary | ICD-10-CM

## 2019-07-13 DIAGNOSIS — R569 Unspecified convulsions: Secondary | ICD-10-CM | POA: Diagnosis not present

## 2019-07-13 NOTE — Patient Instructions (Signed)
We will continue to monitor you. I am happy that you have had no further events. Please be cautious with driving. Follow up with [place] in 6-12 months. Continue close follow up with PCP.   Please maintain precautions. Do not participate in activities where a loss of awareness could harm you or someone else. No swimming alone, no tub bathing, no hot tubs, no driving, no operating motorized vehicles (cars, ATVs, motocycles, etc), lawnmowers, power tools or firearms. No standing at heights, such as rooftops, ladders or stairs. Avoid hot objects such as stoves, heaters, open fires. Wear a helmet when riding a bicycle, scooter, skateboard, etc. and avoid areas of traffic. Set your water heater to 120 degrees or less.   Seizure, Adult A seizure is a sudden burst of abnormal electrical activity in the brain. Seizures usually last from 30 seconds to 2 minutes. They can cause many different symptoms. Usually, seizures are not harmful unless they last a long time. What are the causes? Common causes of this condition include:  Fever or infection.  Conditions that affect the brain, such as: ? A brain abnormality that you were born with. ? A brain or head injury. ? Bleeding in the brain. ? A tumor. ? Stroke. ? Brain disorders such as autism or cerebral palsy.  Low blood sugar.  Conditions that are passed from parent to child (are inherited).  Problems with substances, such as: ? Having a reaction to a drug or a medicine. ? Suddenly stopping the use of a substance (withdrawal). In some cases, the cause may not be known. A person who has repeated seizures over time without a clear cause has a condition called epilepsy. What increases the risk? You are more likely to get this condition if you have:  A family history of epilepsy.  Had a seizure in the past.  A brain disorder.  A history of head injury, lack of oxygen at birth, or strokes. What are the signs or symptoms? There are many types of  seizures. The symptoms vary depending on the type of seizure you have. Examples of symptoms during a seizure include:  Shaking (convulsions).  Stiffness in the body.  Passing out (losing consciousness).  Head nodding.  Staring.  Not responding to sound or touch.  Loss of bladder control and bowel control. Some people have symptoms right before and right after a seizure happens. Symptoms before a seizure may include:  Fear.  Worry (anxiety).  Feeling like you may vomit (nauseous).  Feeling like the room is spinning (vertigo).  Feeling like you saw or heard something before (dj vu).  Odd tastes or smells.  Changes in how you see. You may see flashing lights or spots. Symptoms after a seizure happens can include:  Confusion.  Sleepiness.  Headache.  Weakness on one side of the body. How is this treated? Most seizures will stop on their own in under 5 minutes. In these cases, no treatment is needed. Seizures that last longer than 5 minutes will usually need treatment. Treatment can include:  Medicines given through an IV tube.  Avoiding things that are known to cause your seizures. These can include medicines that you take for another condition.  Medicines to treat epilepsy.  Surgery to stop the seizures. This may be needed if medicines do not help. Follow these instructions at home: Medicines  Take over-the-counter and prescription medicines only as told by your doctor.  Do not eat or drink anything that may keep your medicine from working, such  as alcohol. Activity  Do not do any activities that would be dangerous if you had another seizure, like driving or swimming. Wait until your doctor says it is safe for you to do them.  If you live in the [place] ask your local DMV (department of motor vehicles) when you can drive.  Get plenty of rest. Teaching others Teach friends and family what to do when you have a seizure. They should:  Lay you on the  ground.  Protect your head and body.  Loosen any tight clothing around your neck.  Turn you on your side.  Not hold you down.  Not put anything into your mouth.  Know whether or not you need emergency care.  Stay with you until you are better.  General instructions  Contact your doctor each time you have a seizure.  Avoid anything that gives you seizures.  Keep a seizure diary. Write down: ? What you think caused each seizure. ? What you remember about each seizure.  Keep all follow-up visits as told by your doctor. This is important. Contact a doctor if:  You have another seizure.  You have seizures more often.  There is any change in what happens during your seizures.  You keep having seizures with treatment.  You have symptoms of being sick or having an infection. Get help right away if:  You have a seizure that: ? Lasts longer than 5 minutes. ? Is different than seizures you had before. ? Makes it harder to breathe. ? Happens after you hurt your head.  You have any of these symptoms after a seizure: ? Not being able to speak. ? Not being able to use a part of your body. ? Confusion. ? A bad headache.  You have two or more seizures in a row.  You do not wake up right after a seizure.  You get hurt during a seizure. These symptoms may be an emergency. Do not wait to see if the symptoms will go away. Get medical help right away. Call your local emergency services (911 in the [place] Do not drive yourself to the hospital. Summary  Seizures usually last from 30 seconds to 2 minutes. Usually, they are not harmful unless they last a long time.  Do not eat or drink anything that may keep your medicine from working, such as alcohol.  Teach friends and family what to do when you have a seizure.  Contact your doctor each time you have a seizure. This information is not intended to replace advice given to you by your health care provider. Make sure you  discuss any questions you have with your health care provider. Document Revised: 09/05/2018 Document Reviewed: 09/05/2018 Elsevier Patient Education  [place].

## 2019-07-13 NOTE — Progress Notes (Addendum)
PATIENT: Jason Mcmahon DOB: March 26, 1966  REASON FOR VISIT: follow up HISTORY FROM: patient  Chief Complaint  Patient presents with  . Follow-up    RM5. alone. No questions. No concerns. No changes.     HISTORY OF PRESENT ILLNESS: Today 07/13/19 Jason Mcmahon is a 54 y.o. male here today for follow up for seizure. He had a sigle, unprovoked seizure on 01/17/2019. Workup has been unremarkable. He was diagnosed with primary biliary cholangitis following seizure. He has worked on healthy lifestyle habits. He has stopped drinking sodas. He limits red meats to once every two weeks. He is eating more fish. He continues to work full time. He denies any seizure like activity since last being seen. He was never started on AED's. He is feeling well today. He does note elevated BP in the setting of white coat syndrome. He reports that BP is checked regularly at home and usually 130's/80's. He takes lisinopril 20mg  at bedtime.  HISTORY: (copied from Dr Guadelupe Sabin note on 01/29/2019)  Dear Dr. Hilma Favors, I saw your patient, Jason Mcmahon, upon your kind request in my neurologic clinic today for initial consultation of his seizure event.  The patient is unaccompanied today.  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  The seizure occurred when he was in [place], he was in a casino at the time.  He reports that they had just reached the casino.  He had been there once before.  His wife was away in [place] with their son and grandson.  He had gone with a friend and his girlfriend and patient had not been driving, it was about a 4-hour drive, he went to the bathroom and sat down to start playing, he was playing for about 10 minutes and then started having some blurry vision, tried to lean back because he felt he needed to  focus differently with his eyes and that was the last thing he remembered.  Apparently he fell off the stool, he had a witnessed generalized seizure event which he believes was about 3 to 5 minutes by a later report back to him.  He was post ictal afterwards including groggy and does not recollect having conversations with EMS afterwards although he was talking to them.  They also called his wife apparently but he does not remember that.  He does remember the hospital work-up.  He also remembers being told that he could not drive for 6 months.  He was also told to have an EEG as an outpatient.  He denies any drinking alcohol or illicit drug use, in fact he reports that he does not drink alcohol at all. He has no prior history of epilepsy, no FHx of seizures.  He has 2 sons, his older son is from a previous relationship. He has not had any residual symptoms or further seizure-like events.  No sudden onset of one-sided weakness or numbness, no slurring of speech, no recurrent headaches.  He had a hospital admission in [place], [place].  I reviewed the hospital records as well as your office note from 01/22/2019 which you kindly included.  MRI brain without contrast on 01/18/2019 showed impression: No acute intracranial process.  Bilateral white matter foci are more numerous than would be expected for patient's age.  Differential considerations include chronic microvascular ischemic changes, migrainous changes, among other  etiologies.  CT head without contrast on 01/17/2019 showed: Negative CT head.  He had a left shoulder x-ray after his fall on 01/17/2019 which showed: Impression: Negative exam.  He had a neurologic consultation as I understand, I do not have those records.  He reports that he still has some left shoulder soreness.  He has an eye appointment pending for later this year, he has prescription trifocals.  He has not been driving a car, he is worried about not being able to drive for 6 months but is willing to  commit to that.  He works in [place], he works on [employer], has a 45-minute commute to work, has to be at work at 42.  He is in bed at 8 typically and rise time is around 430.  He does report snoring but feels like he is sleeping well.  He would be willing to ask about apneas and sleep disturbance from his wife's end of things when he talks to her at home.  He would be willing to proceed with an EEG and MRI with contrast.  He has a remote history of migraines when he was in his 37s in fact he had a migraine with left-sided weakness one time.  He now has rare migraine headaches, no one-sided weakness or numbness, does not take any medication for his migraines, tries to just sleep it off in a dark bedroom.   REVIEW OF SYSTEMS: Out of a complete 14 system review of symptoms, the patient complains only of the following symptoms, none and all other reviewed systems are negative.  ALLERGIES: No Known Allergies  HOME MEDICATIONS: Outpatient Medications Prior to Visit  Medication Sig Dispense Refill  . Febuxostat (ULORIC) 80 MG TABS Take 1 tablet (80 mg total) by mouth daily. 30 tablet 5  . lisinopril (ZESTRIL) 20 MG tablet Take 20 mg by mouth daily.    . ursodiol (ACTIGALL) 500 MG tablet Take 1 tablet (500 mg total) by mouth 2 (two) times daily. 60 tablet 3   No facility-administered medications prior to visit.    PAST MEDICAL HISTORY: Past Medical History:  Diagnosis Date  . Gout   . Hypertension   . Primary biliary cholangitis ([place])     PAST SURGICAL HISTORY: Past Surgical History:  Procedure Laterality Date  . arm surgery    . COLONOSCOPY N/A 02/28/2016   Procedure: COLONOSCOPY;  Surgeon: Aviva Signs, MD;  Location: [facility];  Service: Gastroenterology;  Laterality: N/A;    FAMILY HISTORY: Family History  Problem Relation Age of Onset  . Liver disease Father        Alcoholic  . Hypertension Father   . Cancer Father   . Seizures Paternal Aunt   .  Diabetes Paternal Grandmother   . Colon cancer Neg Hx     SOCIAL HISTORY: Social History   Socioeconomic History  . Marital status: Married    Spouse name: Not on file  . Number of children: Not on file  . Years of education: Not on file  . Highest education level: Not on file  Occupational History  . Not on file  Tobacco Use  . Smoking status: Former Smoker    Packs/day: 3.00    Years: 18.00    Pack years: 54.00    Types: Cigarettes  . Smokeless tobacco: Never Used  Substance and Sexual Activity  . Alcohol use: No  . Drug use: No  . Sexual activity: Not on file  Other Topics Concern  .  Not on file  Social History Narrative  . Not on file   Social Determinants of Health   Financial Resource Strain:   . Difficulty of Paying Living Expenses: Not on file  Food Insecurity:   . Worried About Charity fundraiser in the Last Year: Not on file  . Ran Out of Food in the Last Year: Not on file  Transportation Needs:   . Lack of Transportation (Medical): Not on file  . Lack of Transportation (Non-Medical): Not on file  Physical Activity:   . Days of Exercise per Week: Not on file  . Minutes of Exercise per Session: Not on file  Stress:   . Feeling of Stress : Not on file  Social Connections:   . Frequency of Communication with Friends and Family: Not on file  . Frequency of Social Gatherings with Friends and Family: Not on file  . Attends Religious Services: Not on file  . Active Member of Clubs or Organizations: Not on file  . Attends Archivist Meetings: Not on file  . Marital Status: Not on file  Intimate Partner Violence:   . Fear of Current or Ex-Partner: Not on file  . Emotionally Abused: Not on file  . Physically Abused: Not on file  . Sexually Abused: Not on file      PHYSICAL EXAM  Vitals:   07/13/19 1508  BP: (!) 166/101  Pulse: 62  Temp: (!) 97.2 F (36.2 C)  Weight: 182 lb (82.6 kg)  Height: 5' 8.5" (1.74 m)   Body mass index is  27.27 kg/m.  Generalized: Well developed, in no acute distress  Cardiology: normal rate and rhythm, no murmur noted Respiratory: clear to auscultation bilaterally  Neurological examination  Mentation: Alert oriented to time, place, history taking. Follows all commands speech and language fluent Cranial nerve II-XII: Pupils were equal round reactive to light. Extraocular movements were full, visual field were full on confrontational test. Facial sensation and strength were normal. Uvula tongue midline. Head turning and shoulder shrug  were normal and symmetric. Motor: The motor testing reveals 5 over 5 strength of all 4 extremities. Good symmetric motor tone is noted throughout.  Sensory: Sensory testing is intact to soft touch on all 4 extremities. No evidence of extinction is noted.  Coordination: Cerebellar testing reveals good finger-nose-finger and heel-to-shin bilaterally.  Gait and station: Gait is normal. Romberg is negative. No drift is seen.    DIAGNOSTIC DATA (LABS, IMAGING, TESTING) - I reviewed patient records, labs, notes, testing and imaging myself where available.  No flowsheet data found.   Lab Results  Component Value Date   WBC 4.2 06/04/2019   HGB 13.8 06/04/2019   HCT 41.0 06/04/2019   MCV 97.4 06/04/2019   PLT 117 (L) 06/04/2019      Component Value Date/Time   NA 138 06/04/2019 0741   NA 144 01/29/2019 1316   K 4.2 06/04/2019 0741   CL 105 06/04/2019 0741   CO2 26 06/04/2019 0741   GLUCOSE 85 06/04/2019 0741   BUN 13 06/04/2019 0741   BUN 13 01/29/2019 1316   CREATININE 1.07 06/04/2019 0741   CREATININE 1.05 05/14/2019 0936   CALCIUM 9.0 06/04/2019 0741   PROT 7.0 06/04/2019 0741   PROT 7.1 01/29/2019 1316   ALBUMIN 3.4 (L) 06/04/2019 0741   ALBUMIN 4.6 01/29/2019 1316   AST 52 (H) 06/04/2019 0741   ALT 82 (H) 06/04/2019 0741   ALKPHOS 94 06/04/2019 0741   BILITOT 1.1  06/04/2019 0741   BILITOT 1.0 01/29/2019 1316   GFRNONAA >60 06/04/2019 0741     GFRAA >60 06/04/2019 0741   No results found for: CHOL, HDL, LDLCALC, LDLDIRECT, TRIG, CHOLHDL Lab Results  Component Value Date   HGBA1C 5.0 06/25/2012   No results found for: VITAMINB12 No results found for: TSH   ASSESSMENT AND PLAN 54 y.o. year old male  has a past medical history of Gout, Hypertension, and Primary biliary cholangitis ([place]). here with     ICD-10-CM   1. Single unprovoked seizure ([place])  R56.9   2. Primary biliary cholangitis ([facility])  K74.3     Stylez is doing well. He denies any seizure like activity since last being seen. Workup has been unremarkable. He is working closely with GI and PCP for new diagnosis of primary biliary cholangitis. He continues healthy lifestyle changes to avoid simple sugars and fatty foods. He is monitoring BP closely and takes lisinopril as prescribed. He will continue to follow up closely with GI and PCP. I have reviewed seizure precautions and provided additional information in his AVS. He will use caution with driving. Safety considerations discussed. He will follow up in 6 months, sooner if needed. He verbalizes understanding and agreement with this plan.    No orders of the defined types were placed in this encounter.    No orders of the defined types were placed in this encounter.     Debbora Presto, FNP-C 07/13/2019, 3:46 PM [facility] [address], [place], [place] [address] [phone]  I reviewed the above note and documentation by the Nurse Practitioner and agree with the history, exam, assessment and plan as outlined above. I was available for consultation. Star Age, MD, PhD [facility])

## 2019-07-19 ENCOUNTER — Telehealth: Payer: Self-pay | Admitting: Orthopaedic Surgery

## 2019-07-21 NOTE — Telephone Encounter (Signed)
In reference to refill, done today per Interface [employer] interface -- fax received from [facility], [address], Linna Hoff "Drug Change Request* due to Uloric/Febuxostat 80mg  being Non-covered by patient's plan. Request to change to  Allopurinol tablets

## 2019-07-21 NOTE — Telephone Encounter (Signed)
He cannot tolerate Allopurinol.  He needs uloric.

## 2019-07-30 NOTE — Telephone Encounter (Signed)
Pt already has approval for Uloric authorization #: [id number] Exp 08/2020

## 2019-08-13 ENCOUNTER — Telehealth: Payer: Self-pay | Admitting: Internal Medicine

## 2019-08-13 NOTE — Telephone Encounter (Signed)
[phone]  Patient wife called with concerns about a medication he was prescribed for gout.  The side effects listed liver problems

## 2019-08-13 NOTE — Telephone Encounter (Signed)
Spoke with pt's spouse. Pt is currently on Uloric 80 mg daily given by his PCP. The medication has gone up and pt is thinking about changing to Allopurinol. They would like to know if Uloric or Allopurinol is better for pts liver. Pt doesn't want to change to another medication if it is more harmful for his liver.

## 2019-08-13 NOTE — Telephone Encounter (Signed)
It's not really possible to say if one is better/worse for his liver. Uloric has about a 3-7% chance of liver problems. Allopurinol has a <1% chance. However, this is a "chance of a problem" not a guarantee that one would be better or worse for HIM.  With the current state of his liver (functioning pretty good) neither medication would need to have an adjustment of the dose.  If he decides to change OR stay on his current Uloric, let us know if he sees any problems

## 2019-08-13 NOTE — Telephone Encounter (Signed)
Spoke with pt's spouse. She is aware of EG recommendations. She will discuss possibly changing medication with pts doctor due to the cost and will get back with our office if they see any problems.

## 2019-09-29 ENCOUNTER — Other Ambulatory Visit: Payer: Self-pay

## 2019-09-29 ENCOUNTER — Ambulatory Visit: Payer: 59 | Admitting: Nurse Practitioner

## 2019-09-29 ENCOUNTER — Ambulatory Visit (INDEPENDENT_AMBULATORY_CARE_PROVIDER_SITE_OTHER): Payer: 59 | Admitting: Nurse Practitioner

## 2019-09-29 ENCOUNTER — Encounter: Payer: Self-pay | Admitting: Nurse Practitioner

## 2019-09-29 VITALS — BP 144/88 | HR 66 | Temp 97.3°F | Ht 68.5 in | Wt 189.2 lb

## 2019-09-29 DIAGNOSIS — R1084 Generalized abdominal pain: Secondary | ICD-10-CM | POA: Diagnosis not present

## 2019-09-29 DIAGNOSIS — K743 Primary biliary cirrhosis: Secondary | ICD-10-CM

## 2019-09-29 DIAGNOSIS — R945 Abnormal results of liver function studies: Secondary | ICD-10-CM | POA: Diagnosis not present

## 2019-09-29 DIAGNOSIS — R7989 Other specified abnormal findings of blood chemistry: Secondary | ICD-10-CM

## 2019-09-29 NOTE — Assessment & Plan Note (Signed)
Persistently elevated LFTs in a patient who meets clinical diagnostic criteria for PBC.  He was started on Urso at his last visit 3 months ago we will recheck labs at this time to see if he has had a good biochemical response.  Labs as per above.  Follow-up in 3 months.

## 2019-09-29 NOTE — Assessment & Plan Note (Signed)
Diagnosed with PBC based on liver biopsy, elevated alkaline phosphatase, elevated ANA with at least 1:40 ratio.  At his last visit 3 months ago he was started on Urso at 13 mg/kg daily dose in 2 divided doses.  At this point it is been 3 months we will recheck his labs including CBC, BMP, HFP.  Depending on biochemical response we could increase to 15 mg/kg daily dose in 2 divided doses.  I will have him follow-up in 3 months.  He is generally asymptomatic at this time.  There is also the possibility to add either fibrate or Ocaliva, if needed.  Further recommendations to follow labs.

## 2019-09-29 NOTE — Patient Instructions (Signed)
Your health issues we discussed today were:   PBC with persistently elevated liver labs: 1. Have your labs checked as soon as you can 2. Further recommendations will follow your lab results 3. Continue taking Urso for now at your current dose 4. We may need to increase her dose or add another medication, depending on what your labs show 5. Call us if you have any significant or concerning symptoms such as yellowing of your skin or eyes, real dark urine (brown), itching all over, sudden confusion, etc.  Overall I recommend:  1. Continue your other current medications 2. Return for follow-up in 3 months 3. Call us for any questions or concerns   ---------------------------------------------------------------  I am glad you have received your first COVID-19 vaccine dose [place] keep your appointment for your second dose.  Even after fully vaccinated you should continue to wear a mask, socially distance 6 feet or more from people you do not live with your associated with on a daily basis, continue to wash your hands regularly.  Follow CDC guidelines.  ---------------------------------------------------------------   At [facility] we value your feedback. You may receive a survey about your visit today. Please share your experience as we strive to create trusting relationships with our patients to provide genuine, compassionate, quality care.  We appreciate your understanding and patience as we review any laboratory studies, imaging, and other diagnostic tests that are ordered as we care for you. Our office policy is 5 business days for review of these results, and any emergent or urgent results are addressed in a timely manner for your best interest. If you do not hear from our office in 1 week, please contact us.   We also encourage the use of MyChart, which contains your medical information for your review as well. If you are not enrolled in this feature, an access  code is on this after visit summary for your convenience. Thank you for allowing [place] to be involved in your care.  It was great to see you today!  I hope you have a great day!!

## 2019-09-29 NOTE — Assessment & Plan Note (Signed)
Abdominal pain essentially resolved at this time.  Continue to monitor and notify of any worsening/recurrent abdominal pain.  Follow-up in 3 months regardless.

## 2019-09-29 NOTE — Progress Notes (Signed)
Referring Provider: Ginger Organ Primary Care Physician:  Cory Munch, PA-C Primary GI:  Dr. Gala Romney  Chief Complaint  Patient presents with  . pbc    f/u. Doing fine    HPI:   Jason Mcmahon is a 54 y.o. male who presents for follow-up on PBC. The patient was last seen in our office 06/30/2019 for the same. We initially saw him for abdominal pain and elevated LFTs with AST/ALT in the upper 100s/mid 200s.  Colonoscopy up-to-date 2017 with a single polyp found to be tubular adenoma and recommended repeat in 2020.  Hospitalization in October 2020 for abdominal pain, transaminitis with AST/ALT of 219/342, bilirubin 2.2.  CT scan consistent with cholecystitis with improvement in LFTs by discharge and returned to near his baseline at 174/267.  Alk phos improved to 139.  Acute hepatitis panel negative, HIV negative.  Right upper quadrant ultrasound 04/05/2019 with mild nonspecific gallbladder wall thickening without additional evidence to suggest acute cholecystitis, CBD diameter normal at 3.8 mm.  Previously noted to stop amlodipine for possible LFT elevation, has been on Uloric "for years" and has never been previously told his LFTs are elevated.  Abdominal pain that is intermittent, sharp, generalized abdomen.  14 pound weight loss in the last couple months with change in diet.  HIDA scan recommended and completed 05/21/2019 found normal EF of the gallbladder although some mid abdominal pain with Ensure consumption, cystic and common bile ducts patent.  Post hospitalization labs found persistently elevated LFTs, elevated alk phos at 165, upper limit normal bilirubin of 1.1.  Elevated direct bilirubin of 0.4.  Autoimmune work-up found negative ANA, mitochondrial antibody significantly elevated at 41.4, smooth muscle antibody IgG elevated at 65.  Consistent with PBC but not classic presentation and recommended liver biopsy.  Liver biopsy completed 06/04/2019 which found findings  compatible with PBC and early stage with ductopenia, especially with the patient's clinical finding of positive antimitochondrial antibody test although his biopsy was deemed not diagnostic.  Possible overlapping autoimmune hepatitis or drug-induced liver injury with autoimmune features are also consideration.  In general it was found that he did meet PBC diagnostic criteria with elevated alkaline phosphatase at least 1.5 times upper limit normal (180 on 04/05/2019), positive AMA at a titer of 1-40 or higher (41.4 "positive" on 05/14/2019), histologic evidence of PBC (surgical pathology as above).  At his last visit he denied abdominal pain, hepatic symptoms.  No other overt GI complaints.  We had a significant discussion on PBC and chance of normal life span if he starts treatment.  Recommended Urso 500 mg twice daily, vaccination for hepatitis A and B, avoid all alcohol, keep Tylenol doses to less than 2000 g a day, monitoring of cholesterol and bone density given PBC, follow-up in 3 months.  Today he states he's doing well overall. Denies abdominal pain, N/V, hematochezia, melena, fever, chills, unintentional weight loss. Denies yellowing of skin/eyes, darkened urine, generalized pruritis, acute episodic confusion, tremors/shakes. Denies URI or flu-like symptoms. Denies loss of sense of taste or smell. Last tested for COVID-19 in October and was negative. Has received first dose of Moderna vaccine, has appointment for second dose 10/13/19. Denies chest pain, dyspnea, dizziness, lightheadedness, syncope, near syncope. Denies any other upper or lower GI symptoms.  Past Medical History:  Diagnosis Date  . Gout   . Hypertension   . Primary biliary cholangitis [facility])     Past Surgical History:  Procedure Laterality Date  . arm surgery    .  COLONOSCOPY N/A 02/28/2016   Procedure: COLONOSCOPY;  Surgeon: Aviva Signs, MD;  [facility];  Service: Gastroenterology;  Laterality: N/A;    Current  Outpatient Medications  Medication Sig Dispense Refill  . allopurinol (ZYLOPRIM) 300 MG tablet Take 300 mg by mouth daily.    Marland Kitchen lisinopril (ZESTRIL) 20 MG tablet Take 20 mg by mouth daily.    . ursodiol (ACTIGALL) 500 MG tablet Take 1 tablet (500 mg total) by mouth 2 (two) times daily. 60 tablet 3   No current facility-administered medications for this visit.    Allergies as of 09/29/2019  . (No Known Allergies)    Family History  Problem Relation Age of Onset  . Liver disease Father        Alcoholic  . Hypertension Father   . Cancer Father   . Seizures Paternal Aunt   . Diabetes Paternal Grandmother   . Colon cancer Neg Hx     Social History   Socioeconomic History  . Marital status: Married    Spouse name: Not on file  . Number of children: Not on file  . Years of education: Not on file  . Highest education level: Not on file  Occupational History  . Not on file  Tobacco Use  . Smoking status: Former Smoker    Packs/day: 3.00    Years: 18.00    Pack years: 54.00    Types: Cigarettes  . Smokeless tobacco: Never Used  Substance and Sexual Activity  . Alcohol use: No  . Drug use: No  . Sexual activity: Not on file  Other Topics Concern  . Not on file  Social History Narrative  . Not on file   Social Determinants of Health   Financial Resource Strain:   . Difficulty of Paying Living Expenses:   Food Insecurity:   . Worried About Charity fundraiser in the Last Year:   . Arboriculturist in the Last Year:   Transportation Needs:   . Film/video editor (Medical):   Marland Kitchen Lack of Transportation (Non-Medical):   Physical Activity:   . Days of Exercise per Week:   . Minutes of Exercise per Session:   Stress:   . Feeling of Stress :   Social Connections:   . Frequency of Communication with Friends and Family:   . Frequency of Social Gatherings with Friends and Family:   . Attends Religious Services:   . Active Member of Clubs or Organizations:   . Attends  Archivist Meetings:   Marland Kitchen Marital Status:     Subjective: Review of Systems  Constitutional: Negative for chills, fever, malaise/fatigue and weight loss.  HENT: Negative for congestion and sore throat.   Respiratory: Negative for cough and shortness of breath.   Cardiovascular: Negative for chest pain and palpitations.  Gastrointestinal: Negative for abdominal pain, blood in stool, diarrhea, melena, nausea and vomiting.  Musculoskeletal: Negative for joint pain and myalgias.  Skin: Positive for itching. Negative for rash.       Rare/intermittent spot itching, self-resolves.  Neurological: Negative for dizziness and weakness.  Endo/Heme/Allergies: Does not bruise/bleed easily.  Psychiatric/Behavioral: Negative for depression. The patient is not nervous/anxious.   All other systems reviewed and are negative.    Objective: BP (!) 144/88   Pulse 66   Temp (!) 97.3 F (36.3 C) (Oral)   Ht 5' 8.5" (1.74 m)   Wt 189 lb 3.2 oz (85.8 kg)   BMI 28.35 kg/m  Physical  Exam Vitals and nursing note reviewed.  Constitutional:      General: He is not in acute distress.    Appearance: Normal appearance. He is normal weight. He is not ill-appearing, toxic-appearing or diaphoretic.  HENT:     Head: Normocephalic and atraumatic.     Nose: No congestion or rhinorrhea.  Eyes:     General: No scleral icterus. Cardiovascular:     Rate and Rhythm: Normal rate and regular rhythm.     Heart sounds: Normal heart sounds.  Pulmonary:     Effort: Pulmonary effort is normal.     Breath sounds: Normal breath sounds.  Abdominal:     General: Abdomen is flat. Bowel sounds are normal. There is no distension.     Palpations: Abdomen is soft. There is no hepatomegaly, splenomegaly or mass.     Tenderness: There is no abdominal tenderness. There is no guarding or rebound.     Hernia: No hernia is present.  Musculoskeletal:     Cervical back: Neck supple.  Skin:    General: Skin is warm and  dry.     Coloration: Skin is not jaundiced.     Findings: No bruising or rash.  Neurological:     General: No focal deficit present.     Mental Status: He is alert and oriented to person, place, and time. Mental status is at baseline.  Psychiatric:        Mood and Affect: Mood normal.        Behavior: Behavior normal.        Thought Content: Thought content normal.       09/29/2019 1:32 PM   Disclaimer: This note was dictated with voice recognition software. Similar sounding words can inadvertently be transcribed and may not be corrected upon review.

## 2019-09-30 LAB — CBC WITH DIFFERENTIAL/PLATELET
Absolute Monocytes: 428 cells/uL (ref 200–950)
Basophils Absolute: 0 cells/uL (ref 0–200)
Basophils Relative: 0 %
Eosinophils Absolute: 0 cells/uL — ABNORMAL LOW (ref 15–500)
Eosinophils Relative: 0 %
HCT: 42.2 % (ref 38.5–50.0)
Hemoglobin: 14.8 g/dL (ref 13.2–17.1)
Lymphs Abs: 2280 cells/uL (ref 850–3900)
MCH: 34.6 pg — ABNORMAL HIGH (ref 27.0–33.0)
MCHC: 35.1 g/dL (ref 32.0–36.0)
MCV: 98.6 fL (ref 80.0–100.0)
MPV: 9.4 fL (ref 7.5–12.5)
Monocytes Relative: 7.5 %
Neutro Abs: 2993 cells/uL (ref 1500–7800)
Neutrophils Relative %: 52.5 %
Platelets: 136 10*3/uL — ABNORMAL LOW (ref 140–400)
RBC: 4.28 10*6/uL (ref 4.20–5.80)
RDW: 14.3 % (ref 11.0–15.0)
Total Lymphocyte: 40 %
WBC: 5.7 10*3/uL (ref 3.8–10.8)

## 2019-09-30 LAB — HEPATIC FUNCTION PANEL
AG Ratio: 1.3 (calc) (ref 1.0–2.5)
ALT: 18 U/L (ref 9–46)
AST: 21 U/L (ref 10–35)
Albumin: 4.5 g/dL (ref 3.6–5.1)
Alkaline phosphatase (APISO): 80 U/L (ref 35–144)
Bilirubin, Direct: 0.3 mg/dL — ABNORMAL HIGH (ref 0.0–0.2)
Globulin: 3.5 g/dL (calc) (ref 1.9–3.7)
Indirect Bilirubin: 0.9 mg/dL (calc) (ref 0.2–1.2)
Total Bilirubin: 1.2 mg/dL (ref 0.2–1.2)
Total Protein: 8 g/dL (ref 6.1–8.1)

## 2019-09-30 LAB — BASIC METABOLIC PANEL
BUN: 17 mg/dL (ref 7–25)
CO2: 30 mmol/L (ref 20–32)
Calcium: 10.1 mg/dL (ref 8.6–10.3)
Chloride: 102 mmol/L (ref 98–110)
Creat: 1.2 mg/dL (ref 0.70–1.33)
Glucose, Bld: 75 mg/dL (ref 65–139)
Potassium: 4.3 mmol/L (ref 3.5–5.3)
Sodium: 138 mmol/L (ref 135–146)

## 2019-10-22 ENCOUNTER — Other Ambulatory Visit: Payer: Self-pay | Admitting: Nurse Practitioner

## 2019-10-22 DIAGNOSIS — K743 Primary biliary cirrhosis: Secondary | ICD-10-CM

## 2019-11-26 ENCOUNTER — Encounter: Payer: Self-pay | Admitting: Family Medicine

## 2019-12-30 ENCOUNTER — Ambulatory Visit (INDEPENDENT_AMBULATORY_CARE_PROVIDER_SITE_OTHER): Payer: 59 | Admitting: Nurse Practitioner

## 2019-12-30 ENCOUNTER — Encounter: Payer: Self-pay | Admitting: Nurse Practitioner

## 2019-12-30 ENCOUNTER — Other Ambulatory Visit: Payer: Self-pay

## 2019-12-30 VITALS — BP 167/92 | HR 65 | Temp 97.3°F | Ht 69.0 in | Wt 189.2 lb

## 2019-12-30 DIAGNOSIS — K743 Primary biliary cirrhosis: Secondary | ICD-10-CM

## 2019-12-30 DIAGNOSIS — Z860100 Personal history of colon polyps, unspecified: Secondary | ICD-10-CM

## 2019-12-30 NOTE — Progress Notes (Signed)
Referring Provider: Ginger Organ Primary Care Physician:  Cory Munch, PA-C Primary GI:  Dr. Gala Romney  Chief Complaint  Patient presents with  . PBC    HPI:   Jason Mcmahon is a 54 y.o. male who presents for follow-up.  The patient was last seen in our office 09/29/2019 for primary biliary cholangitis, abnormal LFTs, generalized abdominal pain.  Colonoscopy up-to-date 2017 with single polyp found to be tubular adenoma and recommended repeat in 2020.  Hospitalization in October 2020 for abdominal pain and transaminitis with CT consistent with cholecystitis with improvement in LFTs by discharge and return to his baseline, although still elevated.  Acute hepatitis panel negative, HIV negative, right upper quadrant ultrasound in 2020 with mild nonspecific gallbladder wall thickening without additional evidence to suggest acute cholecystitis, CBD diameter normal.  Follow-up HIDA scan normal EF, although some discomfort with Ensure consumption.  Autoimmune work-up found mitochondrial antibody significantly elevated at 41.4, smooth muscle antibody IgG elevated at 65 consistent with PBC but not classic presentation and recommended liver biopsy.  Liver biopsy completed 06/04/2019 which found findings compatible with PBC and early stage with ductopenia, especially with the patient's clinical finding of positive antimitochondrial antibody test although his biopsy was deemed not diagnostic.  Possible overlapping autoimmune hepatitis or drug-induced liver injury with autoimmune features are also consideration.  In general it was found that he did meet PBC diagnostic criteria with elevated alkaline phosphatase at least 1.5 times upper limit normal (180 on 04/05/2019), positive AMA at a titer of 1-40 or higher (41.4 "positive" on 05/14/2019), histologic evidence of PBC (surgical pathology as above).  After discussion with the patient and options recommended Urso 500 mg twice a day, hepatitis a and B  vaccination, avoid all alcohol, Tylenol dose is less than 2000 g a day, monitoring of cholesterol and bone density given PBC, follow-up in 3 months.  We discussed likely normal life span with treatment of PBC.  At his follow-up visit he noted to be doing well, no overt GI or hepatic complaints.  At that time he had received his first dose of Materna COVID-19 vaccination and had an appointment on 10/13/2019 for the second dose.  Recommended recheck labs on Urso, further recommendations to follow, call us for any concerning symptoms, follow-up in 3 months otherwise.  Labs were completed 09/29/2019.  CBC with no leukocytosis, stable/normal hemoglobin, platelet count mildly depressed at 136 but improved.  BMP with normal electrolytes and kidney function.  Hepatic function panel found mildly elevated, but improved direct bilirubin at 0.3.  Indirect bilirubin, alkaline phosphatase, AST/ALT all normalized.  His AST/ALT on Urso was 21/18 where previously ranged around 175/250.  Communicated results to the patient including apparent good response to medication and recommend continue current dose, recheck labs at follow-up.  Today he states he's doing well overall. He is taking URSO twice daily as prescribed, not missing doses. No adverse effects he's aware of. Denies abdominal pain, N/V, hematochezia, melena, fever, chills, unintentional weight loss. Denies URI or flu-like symptoms. Denies any yellowing of the skin/eyes, darkened urine, acute episodic confusion, generalized tremors, generalized pruritis. Denies loss of sense of taste or smell. The patient has received COVID-19 vaccination(s). Denies chest pain, dyspnea, dizziness, lightheadedness, syncope, near syncope. Denies any other upper or lower GI symptoms.  He completed both Hepatitis A and B vaccine series today.  He is due for colonoscopy, was due last year but COVID-19 pandemic and procedure cancellations interfered.   Past Medical History:  Diagnosis  Date  . Gout   . Hypertension   . Primary biliary cholangitis [facility])     Past Surgical History:  Procedure Laterality Date  . arm surgery    . COLONOSCOPY N/A 02/28/2016   Procedure: COLONOSCOPY;  Surgeon: Aviva Signs, MD;  Location[facility];  Service: Gastroenterology;  Laterality: N/A;    Current Outpatient Medications  Medication Sig Dispense Refill  . allopurinol (ZYLOPRIM) 300 MG tablet Take 300 mg by mouth daily.    Marland Kitchen lisinopril (ZESTRIL) 20 MG tablet Take 20 mg by mouth daily.    . ursodiol (ACTIGALL) 500 MG tablet TAKE 1 TABLET(500 MG) BY MOUTH TWICE DAILY 60 tablet 5   No current facility-administered medications for this visit.    Allergies as of 12/30/2019  . (No Known Allergies)    Family History  Problem Relation Age of Onset  . Liver disease Father        Alcoholic  . Hypertension Father   . Cancer Father   . Seizures Paternal Aunt   . Diabetes Paternal Grandmother   . Colon cancer Neg Hx     Social History   Socioeconomic History  . Marital status: Married    Spouse name: Not on file  . Number of children: Not on file  . Years of education: Not on file  . Highest education level: Not on file  Occupational History  . Not on file  Tobacco Use  . Smoking status: Former Smoker    Packs/day: 3.00    Years: 18.00    Pack years: 54.00    Types: Cigarettes  . Smokeless tobacco: Never Used  Substance and Sexual Activity  . Alcohol use: No  . Drug use: No  . Sexual activity: Not on file  Other Topics Concern  . Not on file  Social History Narrative  . Not on file   Social Determinants of Health   Financial Resource Strain:   . Difficulty of Paying Living Expenses:   Food Insecurity:   . Worried About Charity fundraiser in the Last Year:   . Arboriculturist in the Last Year:   Transportation Needs:   . Film/video editor (Medical):   Marland Kitchen Lack of Transportation (Non-Medical):   Physical Activity:   . Days of Exercise per Week:   .  Minutes of Exercise per Session:   Stress:   . Feeling of Stress :   Social Connections:   . Frequency of Communication with Friends and Family:   . Frequency of Social Gatherings with Friends and Family:   . Attends Religious Services:   . Active Member of Clubs or Organizations:   . Attends Archivist Meetings:   Marland Kitchen Marital Status:     Subjective: Review of Systems  Constitutional: Negative for chills, fever, malaise/fatigue and weight loss.  HENT: Negative for congestion and sore throat.   Respiratory: Negative for cough and shortness of breath.   Cardiovascular: Negative for chest pain and palpitations.  Gastrointestinal: Negative for abdominal pain, blood in stool, diarrhea, melena, nausea and vomiting.  Musculoskeletal: Negative for joint pain and myalgias.  Skin: Negative for rash.  Neurological: Negative for dizziness and weakness.  Endo/Heme/Allergies: Does not bruise/bleed easily.  Psychiatric/Behavioral: Negative for depression. The patient is not nervous/anxious.   All other systems reviewed and are negative.    Objective: BP (!) 167/92   Pulse 65   Temp (!) 97.3 F (36.3 C) (Oral)   Ht 5\' 9"  (  1.753 m)   Wt 189 lb 3.2 oz (85.8 kg)   BMI 27.94 kg/m  Physical Exam Vitals and nursing note reviewed.  Constitutional:      General: He is not in acute distress.    Appearance: Normal appearance. He is normal weight. He is not ill-appearing, toxic-appearing or diaphoretic.  HENT:     Head: Normocephalic and atraumatic.     Nose: No congestion or rhinorrhea.  Eyes:     General: No scleral icterus. Cardiovascular:     Rate and Rhythm: Normal rate and regular rhythm.     Heart sounds: Normal heart sounds.  Pulmonary:     Effort: Pulmonary effort is normal.     Breath sounds: Normal breath sounds.  Abdominal:     General: Bowel sounds are normal. There is no distension.     Palpations: Abdomen is soft. There is no hepatomegaly, splenomegaly or mass.      Tenderness: There is no abdominal tenderness. There is no guarding or rebound.     Hernia: No hernia is present.  Musculoskeletal:     Cervical back: Neck supple.  Skin:    General: Skin is warm and dry.     Coloration: Skin is not jaundiced.     Findings: No bruising or rash.  Neurological:     General: No focal deficit present.     Mental Status: He is alert and oriented to person, place, and time. Mental status is at baseline.  Psychiatric:        Mood and Affect: Mood normal.        Behavior: Behavior normal.        Thought Content: Thought content normal.       12/30/2019 3:29 PM   Disclaimer: This note was dictated with voice recognition software. Similar sounding words can inadvertently be transcribed and may not be corrected upon review.

## 2019-12-30 NOTE — Assessment & Plan Note (Addendum)
Diagnosed with PBC after autoimmune serologies and liver biopsy.  He was started on Urso about 6 months ago.  Since then his LFTs have all normalized.  He appears to be having a good response to his medication.  I will recheck his CBC, BMP, hepatic function panel.  I will also recheck a right upper quadrant liver ultrasound for any changes.  No previous suggestion of cirrhosis.  Recommend he continue Urso at his current dose.  Follow-up in 6 months for recheck of labs.  Overall I feel he likely has a good prognosis as long as he continues to respond well to the medication.

## 2019-12-30 NOTE — Assessment & Plan Note (Addendum)
Previous colonoscopy in 2017 with an 8 mm polyp and a 2 mm polyp found to be tubular adenoma.  Recommend repeat colonoscopy in 3 years (24).  He was due last year but due to COVID-19 pandemic and cancellation of large swath of appointments for procedures he was deferred.  At this point we will proceed with scheduling his colonoscopy.  Previous procedure completed on conscious sedation without any sedation difficulties.  Proceed with TCS with Dr. Gala Romney in near future: the risks, benefits, and alternatives have been discussed with the patient in detail. The patient states understanding and desires to proceed.  The patient is not on any anticoagulants, anxiolytics, chronic pain medications, antidepressants, antidiabetics, or iron supplements.  Denies alcohol and drug use.  Conscious sedation should be adequate for his procedure as it was for his last.  ASA III (poorly controlled htn)

## 2019-12-30 NOTE — Patient Instructions (Signed)
Your health issues we discussed today were:   Need for colonoscopy: 1. We will call you to schedule your colonoscopy for you 2. Further recommendations will follow your colonoscopy  Primary biliary cholangitis (PBC) well-managed on Urso: 1. As we discussed, your labs have all trended to normal on Urso.  This is great news exhalation point 2. As we discussed last time, with treatment and good response most people with PBC have an lifespan that is equal to the general population 3. Continue taking Urso as you have been 4. Have your labs checked when you are able to 5. We will help schedule your ultrasound of your liver for you to follow-up on any changes 6. Call us with any concerning symptoms  Overall I recommend:  1. Continue other current medications 2. Return for follow-up in 6 months 3. Call us if you have any questions or concerns   ---------------------------------------------------------------  I am glad you have gotten your COVID-19 vaccination!  Even though you are fully vaccinated you should continue to follow CDC and state/local guidelines.  ---------------------------------------------------------------   At [facility] we value your feedback. You may receive a survey about your visit today. Please share your experience as we strive to create trusting relationships with our patients to provide genuine, compassionate, quality care.  We appreciate your understanding and patience as we review any laboratory studies, imaging, and other diagnostic tests that are ordered as we care for you. Our office policy is 5 business days for review of these results, and any emergent or urgent results are addressed in a timely manner for your best interest. If you do not hear from our office in 1 week, please contact us.   We also encourage the use of MyChart, which contains your medical information for your review as well. If you are not enrolled in this feature, an access  code is on this after visit summary for your convenience. Thank you for allowing [place] to be involved in your care.  It was great to see you today!  I hope you have a great Summer!!

## 2019-12-31 LAB — HEPATIC FUNCTION PANEL
AG Ratio: 1.6 (calc) (ref 1.0–2.5)
ALT: 16 U/L (ref 9–46)
AST: 18 U/L (ref 10–35)
Albumin: 4.7 g/dL (ref 3.6–5.1)
Alkaline phosphatase (APISO): 81 U/L (ref 35–144)
Bilirubin, Direct: 0.3 mg/dL — ABNORMAL HIGH (ref 0.0–0.2)
Globulin: 3 g/dL (calc) (ref 1.9–3.7)
Indirect Bilirubin: 0.8 mg/dL (calc) (ref 0.2–1.2)
Total Bilirubin: 1.1 mg/dL (ref 0.2–1.2)
Total Protein: 7.7 g/dL (ref 6.1–8.1)

## 2019-12-31 LAB — CBC WITH DIFFERENTIAL/PLATELET
Absolute Monocytes: 392 cells/uL (ref 200–950)
Basophils Absolute: 9 cells/uL (ref 0–200)
Basophils Relative: 0.2 %
Eosinophils Absolute: 0 cells/uL — ABNORMAL LOW (ref 15–500)
Eosinophils Relative: 0 %
HCT: 42 % (ref 38.5–50.0)
Hemoglobin: 14.5 g/dL (ref 13.2–17.1)
Lymphs Abs: 1755 cells/uL (ref 850–3900)
MCH: 33.6 pg — ABNORMAL HIGH (ref 27.0–33.0)
MCHC: 34.5 g/dL (ref 32.0–36.0)
MCV: 97.2 fL (ref 80.0–100.0)
MPV: 9.6 fL (ref 7.5–12.5)
Monocytes Relative: 8.7 %
Neutro Abs: 2345 cells/uL (ref 1500–7800)
Neutrophils Relative %: 52.1 %
Platelets: 137 10*3/uL — ABNORMAL LOW (ref 140–400)
RBC: 4.32 10*6/uL (ref 4.20–5.80)
RDW: 13.4 % (ref 11.0–15.0)
Total Lymphocyte: 39 %
WBC: 4.5 10*3/uL (ref 3.8–10.8)

## 2019-12-31 LAB — BASIC METABOLIC PANEL
BUN: 13 mg/dL (ref 7–25)
CO2: 29 mmol/L (ref 20–32)
Calcium: 9.9 mg/dL (ref 8.6–10.3)
Chloride: 105 mmol/L (ref 98–110)
Creat: 1.05 mg/dL (ref 0.70–1.33)
Glucose, Bld: 75 mg/dL (ref 65–139)
Potassium: 4.2 mmol/L (ref 3.5–5.3)
Sodium: 140 mmol/L (ref 135–146)

## 2020-01-08 ENCOUNTER — Telehealth: Payer: Self-pay | Admitting: *Deleted

## 2020-01-08 NOTE — Telephone Encounter (Signed)
Called pt and spoke with spouse (on dpr). TCS scheduled for 8/6 at 12:30pm. COVID scheduled for 8/5 at 11:00am. Aware will mail prep instructions.

## 2020-01-08 NOTE — Telephone Encounter (Signed)
Pt's spouse returned call to schedule apt. Please call [phone].

## 2020-01-08 NOTE — Telephone Encounter (Signed)
LMOVM to schedule TCS with RMR

## 2020-01-08 NOTE — Telephone Encounter (Signed)
PA approved for TCS via [facility] website. Auth# [id number] dates 02/05/2020-05/05/2020

## 2020-01-12 ENCOUNTER — Ambulatory Visit: Payer: 59 | Admitting: Family Medicine

## 2020-01-18 ENCOUNTER — Ambulatory Visit (HOSPITAL_COMMUNITY): Payer: 59

## 2020-01-29 ENCOUNTER — Ambulatory Visit (HOSPITAL_COMMUNITY)
Admission: RE | Admit: 2020-01-29 | Discharge: 2020-01-29 | Disposition: A | Discharge status: Home / Self Care | Payer: 59 | Source: Ambulatory Visit | Attending: Nurse Practitioner | Admitting: Nurse Practitioner

## 2020-01-29 ENCOUNTER — Other Ambulatory Visit: Payer: Self-pay

## 2020-01-29 ENCOUNTER — Other Ambulatory Visit (HOSPITAL_COMMUNITY): Payer: 59

## 2020-01-29 DIAGNOSIS — K743 Primary biliary cirrhosis: Secondary | ICD-10-CM | POA: Diagnosis present

## 2020-01-29 DIAGNOSIS — Z86010 Personal history of colonic polyps: Secondary | ICD-10-CM | POA: Insufficient documentation

## 2020-02-01 ENCOUNTER — Telehealth: Payer: Self-pay | Admitting: Internal Medicine

## 2020-02-01 NOTE — Telephone Encounter (Signed)
patient wife called confused about his prep instructions, said he did good with his prep from last time and he usually does not do good with mirilax, I told her the last prep may not have been available but this is a prep done with mirilax, not just taking it as the directions say.  Please call

## 2020-02-01 NOTE — Telephone Encounter (Signed)
Called pt, LMOVM. Advised preps on back order and follow instructions given

## 2020-02-04 ENCOUNTER — Other Ambulatory Visit: Payer: Self-pay

## 2020-02-04 ENCOUNTER — Other Ambulatory Visit (HOSPITAL_COMMUNITY)
Admission: RE | Admit: 2020-02-04 | Discharge: 2020-02-04 | Disposition: A | Discharge status: Home / Self Care | Payer: 59 | Source: Ambulatory Visit | Attending: Internal Medicine | Admitting: Internal Medicine

## 2020-02-04 DIAGNOSIS — Z01812 Encounter for preprocedural laboratory examination: Secondary | ICD-10-CM | POA: Diagnosis not present

## 2020-02-04 DIAGNOSIS — Z20822 Contact with and (suspected) exposure to covid-19: Secondary | ICD-10-CM | POA: Insufficient documentation

## 2020-02-04 LAB — SARS CORONAVIRUS 2 (TAT 6-24 HRS): SARS Coronavirus 2: NEGATIVE

## 2020-02-05 ENCOUNTER — Other Ambulatory Visit: Payer: Self-pay

## 2020-02-05 ENCOUNTER — Encounter (HOSPITAL_COMMUNITY): Payer: Self-pay | Admitting: Internal Medicine

## 2020-02-05 ENCOUNTER — Ambulatory Visit (HOSPITAL_COMMUNITY)
Admission: RE | Admit: 2020-02-05 | Discharge: 2020-02-05 | Disposition: A | Discharge status: Home / Self Care | Payer: 59 | Attending: Internal Medicine | Admitting: Internal Medicine

## 2020-02-05 ENCOUNTER — Encounter (HOSPITAL_COMMUNITY)
Admission: RE | Disposition: A | Discharge status: Home / Self Care | Payer: Self-pay | Source: Home / Self Care | Attending: Internal Medicine

## 2020-02-05 DIAGNOSIS — Z79899 Other long term (current) drug therapy: Secondary | ICD-10-CM | POA: Insufficient documentation

## 2020-02-05 DIAGNOSIS — Z86010 Personal history of colonic polyps: Secondary | ICD-10-CM | POA: Insufficient documentation

## 2020-02-05 DIAGNOSIS — K743 Primary biliary cirrhosis: Secondary | ICD-10-CM | POA: Insufficient documentation

## 2020-02-05 DIAGNOSIS — Z1211 Encounter for screening for malignant neoplasm of colon: Secondary | ICD-10-CM | POA: Insufficient documentation

## 2020-02-05 DIAGNOSIS — K5730 Diverticulosis of large intestine without perforation or abscess without bleeding: Secondary | ICD-10-CM | POA: Insufficient documentation

## 2020-02-05 DIAGNOSIS — M109 Gout, unspecified: Secondary | ICD-10-CM | POA: Diagnosis not present

## 2020-02-05 DIAGNOSIS — Z87891 Personal history of nicotine dependence: Secondary | ICD-10-CM | POA: Insufficient documentation

## 2020-02-05 DIAGNOSIS — I10 Essential (primary) hypertension: Secondary | ICD-10-CM | POA: Insufficient documentation

## 2020-02-05 HISTORY — PX: COLONOSCOPY: SHX5424

## 2020-02-05 SURGERY — COLONOSCOPY
Anesthesia: Moderate Sedation

## 2020-02-05 MED ORDER — MIDAZOLAM HCL 5 MG/5ML IJ SOLN
INTRAMUSCULAR | Status: AC
  Filled 2020-02-05: qty 10

## 2020-02-05 MED ORDER — MIDAZOLAM HCL 5 MG/5ML IJ SOLN
INTRAMUSCULAR | Status: DC | PRN
  Administered 2020-02-05: 2 mg via INTRAVENOUS
  Administered 2020-02-05: 1 mg via INTRAVENOUS
  Administered 2020-02-05: 2 mg via INTRAVENOUS
  Administered 2020-02-05: 1 mg via INTRAVENOUS

## 2020-02-05 MED ORDER — ATROPINE SULFATE 1 MG/ML IJ SOLN
INTRAMUSCULAR | Status: DC | PRN
  Administered 2020-02-05: .25 mg via INTRAVENOUS

## 2020-02-05 MED ORDER — SODIUM CHLORIDE 0.9 % IV SOLN: INTRAVENOUS | Status: DC

## 2020-02-05 MED ORDER — MEPERIDINE HCL 100 MG/ML IJ SOLN
INTRAMUSCULAR | Status: DC | PRN
  Administered 2020-02-05: 15 mg via INTRAVENOUS
  Administered 2020-02-05: 10 mg via INTRAVENOUS
  Administered 2020-02-05: 25 mg via INTRAVENOUS

## 2020-02-05 MED ORDER — ATROPINE SULFATE 1 MG/ML IJ SOLN
INTRAMUSCULAR | Status: AC
  Filled 2020-02-05: qty 1

## 2020-02-05 MED ORDER — MEPERIDINE HCL 50 MG/ML IJ SOLN
INTRAMUSCULAR | Status: AC
  Filled 2020-02-05: qty 1

## 2020-02-05 MED ORDER — ONDANSETRON HCL 4 MG/2ML IJ SOLN
INTRAMUSCULAR | Status: DC | PRN
  Administered 2020-02-05: 4 mg via INTRAVENOUS

## 2020-02-05 MED ORDER — ONDANSETRON HCL 4 MG/2ML IJ SOLN
INTRAMUSCULAR | Status: AC
  Filled 2020-02-05: qty 2

## 2020-02-05 MED ORDER — STERILE WATER FOR IRRIGATION IR SOLN
Status: DC | PRN
  Administered 2020-02-05: 1.5 mL

## 2020-02-05 NOTE — Op Note (Signed)
[facility] Patient Name: Jason Mcmahon Procedure Date: 02/05/2020 10:48 AM MRN: [id number] Date of Birth: 1965-07-09 Attending MD: Norvel Richards , MD CSN: [id number] Age: 54 Admit Type: Outpatient Procedure:                Colonoscopy Indications:              High risk colon cancer surveillance: Personal                            history of colonic polyps Providers:                Norvel Richards, MD, Otis Peak B. Gwenlyn Perking RN, RN,                            Crystal Page, Nelma Rothman, Technician Referring MD:              Medicines:                Midazolam 6 mg IV, Meperidine 50 mg IV Complications:            No immediate complications. Estimated Blood Loss:     Estimated blood loss: none. Procedure:                Pre-Anesthesia Assessment:                           - Prior to the procedure, a History and Physical                            was performed, and patient medications and                            allergies were reviewed. The patient's tolerance of                            previous anesthesia was also reviewed. The risks                            and benefits of the procedure and the sedation                            options and risks were discussed with the patient.                            All questions were answered, and informed consent                            was obtained. Prior Anticoagulants: The patient has                            taken no previous anticoagulant or antiplatelet                            agents. ASA Grade Assessment: II - A patient with  mild systemic disease. After reviewing the risks                            and benefits, the patient was deemed in                            satisfactory condition to undergo the procedure.                           After obtaining informed consent, the colonoscope                            was passed under direct vision. Throughout the                             procedure, the patient's blood pressure, pulse, and                            oxygen saturations were monitored continuously. The                            CF-[id number] ([id number]) scope was introduced through                            the anus and advanced to the the cecum, identified                            by appendiceal orifice and ileocecal valve. The                            colonoscopy was performed without difficulty. The                            patient tolerated the procedure well. Scope In: 11:09:27 AM Scope Out: 11:31:13 AM Scope Withdrawal Time: 0 hours 6 minutes 38 seconds  Total Procedure Duration: 0 hours 21 minutes 46 seconds  Findings:      The perianal and digital rectal examinations were normal.      Multiple medium-mouthed diverticula were found in the sigmoid colon and       descending colon.      The exam was otherwise without abnormality on direct and retroflexion       views. It is notable as I begin to advance the scope a nice one-to-one       fashion into the sigmoid colon patient developed bradycardia. I       withdrew; heart rate normalized. Tried advance to more times and       bradycardia again developed and quickly resolved. I elected to go ahead       and give atropine 0.25 mg IV so I could perform the examination. Was       able to easily advance the scope to the cecum without further episodes       of bradycardia. Impression:               - Diverticulosis in the sigmoid colon and in the  descending colon.                           - The examination was otherwise normal on direct                            and retroflexion views.                           - No specimens collected. Moderate Sedation:      The administration of moderate sedation was initiated      Moderate (conscious) sedation was administered by the endoscopy nurse       and supervised by the endoscopist. The following parameters were       monitored:  oxygen saturation, heart rate, blood pressure, respiratory       rate, EKG, adequacy of pulmonary ventilation, and response to care.       Total physician intraservice time was 26 minutes. Recommendation:           - Patient has a contact number available for                            emergencies. The signs and symptoms of potential                            delayed complications were discussed with the                            patient. Return to normal activities tomorrow.                            Written discharge instructions were provided to the                            patient.                           - Advance diet as tolerated.                           - Continue present medications.                           - Repeat colonoscopy in 5 years for surveillance.                           - Return to GI office (date not yet determined). Procedure Code(s):        --- Professional ---                           [phone], Colonoscopy, flexible; diagnostic, including                            collection of specimen(s) by brushing or washing,                            when performed (  separate procedure)                           [id number], Moderate sedation; each additional 15                            minutes intraservice time                           G0500, Moderate sedation services provided by the                            same physician or other qualified health care                            professional performing a gastrointestinal                            endoscopic service that sedation supports,                            requiring the presence of an independent trained                            observer to assist in the monitoring of the                            patient's level of consciousness and physiological                            status; initial 15 minutes of intra-service time;                            patient age 64 years or older (additional time may                             be reported with [phone], as appropriate) Diagnosis Code(s):        --- Professional ---                           Z86.010, Personal history of colonic polyps                           K57.30, Diverticulosis of large intestine without                            perforation or abscess without bleeding CPT copyright 2019 American Medical Association. All rights reserved. The codes documented in this report are preliminary and upon coder review may  be revised to meet current compliance requirements. Cristopher Estimable. [place] Natarajan, MD Norvel Richards, MD 02/05/2020 12:26:00 PM This report has been signed electronically. Number of Addenda: 0

## 2020-02-05 NOTE — H&P (Signed)
@LOGO @   Primary Care Physician:  Ginger Organ Primary Gastroenterologist:  Dr. Gala Romney  Pre-Procedure History & Physical: HPI:  Jason Mcmahon is a 54 y.o. male here for surveillance colonoscopy.  History of multiple colonic adenomas removed 2017.  Past Medical History:  Diagnosis Date  . Gout   . Hypertension   . Primary biliary cholangitis [facility])     Past Surgical History:  Procedure Laterality Date  . arm surgery    . COLONOSCOPY N/A 02/28/2016   Procedure: COLONOSCOPY;  Surgeon: Aviva Signs, MD;  Location: [facility]ITE;  Service: Gastroenterology;  Laterality: N/A;    Prior to Admission medications   Medication Sig Start Date End Date Taking? Authorizing Provider  allopurinol (ZYLOPRIM) 300 MG tablet Take 300 mg by mouth daily. 09/07/19  Yes [provider]  lisinopril (ZESTRIL) 20 MG tablet Take 20 mg by mouth daily.   Yes [provider]  ursodiol (ACTIGALL) 500 MG tablet TAKE 1 TABLET(500 MG) BY MOUTH TWICE DAILY Patient taking differently: Take 500 mg by mouth in the morning and at bedtime.  10/23/19  Yes Carlis Stable, NP    Allergies as of 01/08/2020  . (No Known Allergies)    Family History  Problem Relation Age of Onset  . Liver disease Father        Alcoholic  . Hypertension Father   . Cancer Father   . Seizures Paternal Aunt   . Diabetes Paternal Grandmother   . Colon cancer Neg Hx     Social History   Socioeconomic History  . Marital status: Married    Spouse name: Not on file  . Number of children: Not on file  . Years of education: Not on file  . Highest education level: Not on file  Occupational History  . Not on file  Tobacco Use  . Smoking status: Former Smoker    Packs/day: 3.00    Years: 18.00    Pack years: 54.00    Types: Cigarettes  . Smokeless tobacco: Never Used  Vaping Use  . Vaping Use: Never used  Substance and Sexual Activity  . Alcohol use: No  . Drug use: No  . Sexual activity: Not on file   Other Topics Concern  . Not on file  Social History Narrative  . Not on file   Social Determinants of Health   Financial Resource Strain:   . Difficulty of Paying Living Expenses:   Food Insecurity:   . Worried About Charity fundraiser in the Last Year:   . Arboriculturist in the Last Year:   Transportation Needs:   . Film/video editor (Medical):   Marland Kitchen Lack of Transportation (Non-Medical):   Physical Activity:   . Days of Exercise per Week:   . Minutes of Exercise per Session:   Stress:   . Feeling of Stress :   Social Connections:   . Frequency of Communication with Friends and Family:   . Frequency of Social Gatherings with Friends and Family:   . Attends Religious Services:   . Active Member of Clubs or Organizations:   . Attends Archivist Meetings:   Marland Kitchen Marital Status:   Intimate Partner Violence:   . Fear of Current or Ex-Partner:   . Emotionally Abused:   Marland Kitchen Physically Abused:   . Sexually Abused:     Review of Systems: See HPI, otherwise negative ROS  Physical Exam: BP (!) 169/99   Pulse 88  Temp 98.6 F (37 C) (Oral)   Resp 15   Ht 5' 8.5" (1.74 m)   Wt 87.1 kg   SpO2 98%   BMI 28.77 kg/m  General:   Alert,  Well-developed, well-nourished, pleasant and cooperative in NAD Neck:  Supple; no masses or thyromegaly. No significant cervical adenopathy. Lungs:  Clear throughout to auscultation.   No wheezes, crackles, or rhonchi. No acute distress. Heart:  Regular rate and rhythm; no murmurs, clicks, rubs,  or gallops. Abdomen: Non-distended, normal bowel sounds.  Soft and nontender without appreciable mass or hepatosplenomegaly.  Pulses:  Normal pulses noted. Extremities:  Without clubbing or edema.  Impression/Plan: 54 year old gentleman with a history of multiple colonic adenomas removed in 2017; here for surveillance colonoscopy per plan.  The risks, benefits, limitations, alternatives and imponderables have been reviewed with the  patient. Questions have been answered. All parties are agreeable.      Notice: This dictation was prepared with Dragon dictation along with smaller phrase technology. Any transcriptional errors that result from this process are unintentional and may not be corrected upon review.

## 2020-02-05 NOTE — Discharge Instructions (Signed)
Colonoscopy Discharge Instructions  Read the instructions outlined below and refer to this sheet in the next few weeks. These discharge instructions provide you with general information on caring for yourself after you leave the hospital. Your doctor may also give you specific instructions. While your treatment has been planned according to the most current medical practices available, unavoidable complications occasionally occur. If you have any problems or questions after discharge, call Dr. Gala Romney at [phone]. ACTIVITY  You may resume your regular activity, but move at a slower pace for the next 24 hours.   Take frequent rest periods for the next 24 hours.   Walking will help get rid of the air and reduce the bloated feeling in your belly (abdomen).   No driving for 24 hours (because of the medicine (anesthesia) used during the test).    Do not sign any important legal documents or operate any machinery for 24 hours (because of the anesthesia used during the test).  NUTRITION  Drink plenty of fluids.   You may resume your normal diet as instructed by your doctor.   Begin with a light meal and progress to your normal diet. Heavy or fried foods are harder to digest and may make you feel sick to your stomach (nauseated).   Avoid alcoholic beverages for 24 hours or as instructed.  MEDICATIONS  You may resume your normal medications unless your doctor tells you otherwise.  WHAT YOU CAN EXPECT TODAY  Some feelings of bloating in the abdomen.   Passage of more gas than usual.   Spotting of blood in your stool or on the toilet paper.  IF YOU HAD POLYPS REMOVED DURING THE COLONOSCOPY:  No aspirin products for 7 days or as instructed.   No alcohol for 7 days or as instructed.   Eat a soft diet for the next 24 hours.  FINDING OUT THE RESULTS OF YOUR TEST Not all test results are available during your visit. If your test results are not back during the visit, make an appointment  with your caregiver to find out the results. Do not assume everything is normal if you have not heard from your caregiver or the medical facility. It is important for you to follow up on all of your test results.  SEEK IMMEDIATE MEDICAL ATTENTION IF:  You have more than a spotting of blood in your stool.   Your belly is swollen (abdominal distention).   You are nauseated or vomiting.   You have a temperature over 101.   You have abdominal pain or discomfort that is severe or gets worse throughout the day.    No polyps found today  Diverticulosis information provided  I recommend a repeat colonoscopy in 5 years  At patient request I called Jason Mcmahon at [phone] and reviewed results.   Diverticulosis  Diverticulosis is a condition that develops when small pouches (diverticula) form in the wall of the large intestine (colon). The colon is where water is absorbed and stool (feces) is formed. The pouches form when the inside layer of the colon pushes through weak spots in the outer layers of the colon. You may have a few pouches or many of them. The pouches usually do not cause problems unless they become inflamed or infected. When this happens, the condition is called diverticulitis. What are the causes? The cause of this condition is not known. What increases the risk? The following factors may make you more likely to develop this condition:  Being older than age  33. Your risk for this condition increases with age. Diverticulosis is rare among people younger than age 12. By age 1, many people have it.  Eating a low-fiber diet.  Having frequent constipation.  Being overweight.  Not getting enough exercise.  Smoking.  Taking over-the-counter pain medicines, like aspirin and ibuprofen.  Having a family history of diverticulosis. What are the signs or symptoms? In most people, there are no symptoms of this condition. If you do have symptoms, they may  include:  Bloating.  Cramps in the abdomen.  Constipation or diarrhea.  Pain in the lower left side of the abdomen. How is this diagnosed? Because diverticulosis usually has no symptoms, it is most often diagnosed during an exam for other colon problems. The condition may be diagnosed by:  Using a flexible scope to examine the colon (colonoscopy).  Taking an X-ray of the colon after dye has been put into the colon (barium enema).  Having a CT scan. How is this treated? You may not need treatment for this condition. Your health care provider may recommend treatment to prevent problems. You may need treatment if you have symptoms or if you previously had diverticulitis. Treatment may include:  Eating a high-fiber diet.  Taking a fiber supplement.  Taking a live bacteria supplement (probiotic).  Taking medicine to relax your colon. Follow these instructions at home: Medicines  Take over-the-counter and prescription medicines only as told by your health care provider.  If told by your health care provider, take a fiber supplement or probiotic. Constipation prevention Your condition may cause constipation. To prevent or treat constipation, you may need to:  Drink enough fluid to keep your urine pale yellow.  Take over-the-counter or prescription medicines.  Eat foods that are high in fiber, such as beans, whole grains, and fresh fruits and vegetables.  Limit foods that are high in fat and processed sugars, such as fried or sweet foods.  General instructions  Try not to strain when you have a bowel movement.  Keep all follow-up visits as told by your health care provider. This is important. Contact a health care provider if you:  Have pain in your abdomen.  Have bloating.  Have cramps.  Have not had a bowel movement in 3 days. Get help right away if:  Your pain gets worse.  Your bloating becomes very bad.  You have a fever or chills, and your symptoms  suddenly get worse.  You vomit.  You have bowel movements that are bloody or black.  You have bleeding from your rectum. Summary  Diverticulosis is a condition that develops when small pouches (diverticula) form in the wall of the large intestine (colon).  You may have a few pouches or many of them.  This condition is most often diagnosed during an exam for other colon problems.  Treatment may include increasing the fiber in your diet, taking supplements, or taking medicines. This information is not intended to replace advice given to you by your health care provider. Make sure you discuss any questions you have with your health care provider. Document Revised: 01/15/2019 Document Reviewed: 01/15/2019 Elsevier Patient Education  [place].

## 2020-02-11 ENCOUNTER — Encounter (HOSPITAL_COMMUNITY): Payer: Self-pay | Admitting: Internal Medicine

## 2020-03-01 ENCOUNTER — Ambulatory Visit: Payer: 59 | Admitting: Family Medicine

## 2020-03-22 ENCOUNTER — Other Ambulatory Visit: Payer: Self-pay | Admitting: Nurse Practitioner

## 2020-03-22 DIAGNOSIS — K743 Primary biliary cirrhosis: Secondary | ICD-10-CM

## 2020-07-05 ENCOUNTER — Ambulatory Visit: Payer: 59 | Admitting: Nurse Practitioner

## 2020-07-07 ENCOUNTER — Ambulatory Visit (INDEPENDENT_AMBULATORY_CARE_PROVIDER_SITE_OTHER): Payer: 59 | Admitting: Nurse Practitioner

## 2020-07-07 ENCOUNTER — Encounter: Payer: Self-pay | Admitting: Nurse Practitioner

## 2020-07-07 VITALS — BP 158/91 | HR 75 | Temp 97.3°F | Ht 69.0 in | Wt 195.4 lb

## 2020-07-07 DIAGNOSIS — K219 Gastro-esophageal reflux disease without esophagitis: Secondary | ICD-10-CM | POA: Insufficient documentation

## 2020-07-07 DIAGNOSIS — K743 Primary biliary cirrhosis: Secondary | ICD-10-CM

## 2020-07-07 DIAGNOSIS — R1084 Generalized abdominal pain: Secondary | ICD-10-CM | POA: Diagnosis not present

## 2020-07-07 NOTE — Patient Instructions (Signed)
Your health issues we discussed today were:   Primary biliary cirrhosis (PBC) with some apparent mild liver disease: 1. Have your labs completed when you are able to 2. I will set you up for an ultrasound of your liver.  I Ernie Hew have them do a special component to try to determine how much possible scarring may be present 3. We will call you with results 4. Call us if you have any worsening or concerning symptoms  Overall I recommend:  1. Continue other current medications 2. Return for follow-up in 6 months 3. Call us for any questions or concerns   ---------------------------------------------------------------  I am glad you have gotten your COVID-19 vaccination!  Even though you are fully vaccinated you should continue to follow CDC and state/local guidelines.  ---------------------------------------------------------------   At [facility] we value your feedback. You may receive a survey about your visit today. Please share your experience as we strive to create trusting relationships with our patients to provide genuine, compassionate, quality care.  We appreciate your understanding and patience as we review any laboratory studies, imaging, and other diagnostic tests that are ordered as we care for you. Our office policy is 5 business days for review of these results, and any emergent or urgent results are addressed in a timely manner for your best interest. If you do not hear from our office in 1 week, please contact us.   We also encourage the use of MyChart, which contains your medical information for your review as well. If you are not enrolled in this feature, an access code is on this after visit summary for your convenience. Thank you for allowing [place] to be involved in your care.  It was great to see you today!  I hope you have a Happy New Year!!

## 2020-07-07 NOTE — Progress Notes (Signed)
Referring Provider: Ginger Organ Primary Care Physician:  Cory Munch, PA-C Primary GI:  Dr. Gala Romney  Chief Complaint  Patient presents with  . primary biliary cholangitis    F/u. Occas pain upper abd, comes/goes    HPI:   Jason Mcmahon is a 55 y.o. male who presents for follow-up.  Patient was last seen in our office 12/30/2019 for PBC and history of colon polyps.  Hospitalization October 2024 abdominal pain and transaminitis with CT consistently cholecystitis and improved LFTs by discharge and return to baseline.  Noted acute hepatitis panel negative, HIV negative.  Right upper quadrant ultrasound 2020 with nonspecific gallbladder wall thickening without additional evidence suggest acute cholecystitis, CBD diameter normal.  Follow-up HIDA scan with normal EF although some discomfort with Ensure.  Autoimmune work-up found mitochondrial antibody significant elevated at 41.4, smooth muscle antibody IgG elevated 6 5 consistent with PBC but not classic presentation and recommended liver biopsy.  Liver biopsy completed 06/04/2019 which found findings compatible with PBC and early stage with ductopenia, especially with the patient's clinical finding of positive antimitochondrial antibody test although his biopsy was deemed not diagnostic. Possible overlapping autoimmune hepatitis or drug-induced liver injury with autoimmune features are also consideration. In general it was found that he did meet PBC diagnostic criteria with elevated alkaline phosphatase at least 1.5 times upper limit normal (180 on 04/05/2019), positive AMA at a titer of 1-40 or higher (41.4 "positive" on 05/14/2019), histologic evidence of PBC (surgical pathology as above).  Based on discussion with the patient will be started Urso 500 mg twice a day, recommend hepatitis A/B vaccination, avoid all alcohol, mild doses of Tylenol and noticed, monitor cholesterol and bone density given PBC, follow-up in 3  months.  Updated labs 09/29/2019 found mildly elevated and improved direct bilirubin 0.3, indirect bilirubin, alk phos, AST/ALT all normalized.  Previous AST/ALT was 135/250, improved to 21/18 on Urso.  At his last visit doing well, taking Urso as prescribed and not missing doses.  No overt GI or hepatic complaints.  He completed both hepatitis A and B vaccine series that day.  He is due for colonoscopy.  Recommended schedule colonoscopy, continue Urso, update labs and right upper quadrant ultrasound, follow-up in 6 months.  Labs completed 12/30/2019 include CBC which found stable but mildly depressed platelets at 333, basic metabolic panel normal, HFP with stable/improved direct bilirubin mildly elevated at 0.3, indirect bili, alk phos, AST/ALT all normal.  Colonoscopy completed 02/05/20 which found diverticulosis in the sigmoid and descending colon, otherwise normal. Recommended repeat TCS in 5 years (2026).  Today states doing okay overall. He notes some intermittent pain on the left lower rib, doesn't think it's in his abdomen. Not particularly bothersome, just wanted [place] to be aware that it's there. No GERD symptoms. Intermittent GERD and takes OTC medications as needed. Symptom occurrence with dietary indiscretion. Denies N/V, hematochezia, melena, fever, chills, unintentional weight loss. Denies URI or flu-like symptoms. Denies loss of sense of taste or smell. The patient has received COVID-19 vaccination(s). Denies chest pain, dyspnea, dizziness, lightheadedness, syncope, near syncope. Denies any other upper or lower GI symptoms.  Past Medical History:  Diagnosis Date  . Gout   . Hypertension   . Primary biliary cholangitis [facility])     Past Surgical History:  Procedure Laterality Date  . arm surgery    . COLONOSCOPY N/A 02/28/2016   Procedure: COLONOSCOPY;  Surgeon: Aviva Signs, MD;  Location: [facility];  Service: Gastroenterology;  Laterality: N/A;  . COLONOSCOPY N/A 02/05/2020    Procedure: COLONOSCOPY;  Surgeon: Daneil Dolin, MD;  Location: [facility];  Service: Endoscopy;  Laterality: N/A;  12:30pm    Current Outpatient Medications  Medication Sig Dispense Refill  . allopurinol (ZYLOPRIM) 300 MG tablet Take 300 mg by mouth daily.    Marland Kitchen lisinopril (ZESTRIL) 20 MG tablet Take 20 mg by mouth daily.    . ursodiol (ACTIGALL) 500 MG tablet TAKE 1 TABLET(500 MG) BY MOUTH TWICE DAILY 60 tablet 5   No current facility-administered medications for this visit.    Allergies as of 07/07/2020  . (No Known Allergies)    Family History  Problem Relation Age of Onset  . Liver disease Father        Alcoholic  . Hypertension Father   . Cancer Father   . Seizures Paternal Aunt   . Diabetes Paternal Grandmother   . Colon cancer Neg Hx     Social History   Socioeconomic History  . Marital status: Married    Spouse name: Not on file  . Number of children: Not on file  . Years of education: Not on file  . Highest education level: Not on file  Occupational History  . Not on file  Tobacco Use  . Smoking status: Former Smoker    Packs/day: 3.00    Years: 18.00    Pack years: 54.00    Types: Cigarettes  . Smokeless tobacco: Never Used  Vaping Use  . Vaping Use: Never used  Substance and Sexual Activity  . Alcohol use: No  . Drug use: No  . Sexual activity: Not on file  Other Topics Concern  . Not on file  Social History Narrative  . Not on file   Social Determinants of Health   Financial Resource Strain: Not on file  Food Insecurity: Not on file  Transportation Needs: Not on file  Physical Activity: Not on file  Stress: Not on file  Social Connections: Not on file    Subjective: Review of Systems  Constitutional: Negative for chills, fever, malaise/fatigue and weight loss.  HENT: Negative for congestion and sore throat.   Respiratory: Negative for cough and shortness of breath.   Cardiovascular: Negative for chest pain and palpitations.   Gastrointestinal: Positive for heartburn. Negative for abdominal pain, blood in stool, diarrhea, melena, nausea and vomiting.  Musculoskeletal: Negative for joint pain and myalgias.  Skin: Negative for rash.  Neurological: Negative for dizziness and weakness.  Endo/Heme/Allergies: Does not bruise/bleed easily.  Psychiatric/Behavioral: Negative for depression. The patient is not nervous/anxious.   All other systems reviewed and are negative.    Objective: BP (!) 158/91   Pulse 75   Temp (!) 97.3 F (36.3 C)   Ht '5\' 9"'  (1.753 m)   Wt 195 lb 6.4 oz (88.6 kg)   BMI 28.86 kg/m  Physical Exam Vitals and nursing note reviewed.  Constitutional:      General: He is not in acute distress.    Appearance: Normal appearance. He is normal weight. He is not ill-appearing, toxic-appearing or diaphoretic.  HENT:     Head: Normocephalic and atraumatic.     Nose: No congestion or rhinorrhea.  Eyes:     General: No scleral icterus. Cardiovascular:     Rate and Rhythm: Normal rate and regular rhythm.     Heart sounds: Normal heart sounds.  Pulmonary:     Effort: Pulmonary effort is normal.     Breath sounds:  Normal breath sounds.  Abdominal:     General: Bowel sounds are normal. There is no distension.     Palpations: Abdomen is soft. There is no hepatomegaly, splenomegaly or mass.     Tenderness: There is no abdominal tenderness. There is no guarding or rebound.     Hernia: No hernia is present.  Musculoskeletal:     Cervical back: Neck supple.  Skin:    General: Skin is warm and dry.     Coloration: Skin is not jaundiced.     Findings: No bruising or rash.  Neurological:     General: No focal deficit present.     Mental Status: He is alert and oriented to person, place, and time. Mental status is at baseline.  Psychiatric:        Mood and Affect: Mood normal.        Behavior: Behavior normal.        Thought Content: Thought content normal.      Assessment:  Very pleasant  55 year old male presents for follow-up on PBC.  The patient is abdominal ultrasound does have increased peripheral markings initially most likely with fatty liver disease/early damage.  There is possibility of early cirrhosis given his platelet count.  Does not appear to be advanced fibrosis.  Since starting Urso his labs have normalized quite nicely and to continue to be normal.  He denies any overt GI or hepatic complaints other than mild, intermittent pain on his left lower rib which he does not think is related to having.  He also describes intermittent mild reflux related to dietary discretions which he takes over-the-counter treatments and manages quite well.  He is not wanting treatment for his GERD at this time.  He feels his abdominal pain is mild and quite tolerable but he just wanted [place] to be aware.  At this point he is due for updated labs and imaging.  I will recommend to continue Leonides [place] is to be working quite well for him.   Plan: 1. Continue Urso 500 mg twice a day 2. CBC, BMP, HFP 3. Right upper quadrant ultrasound with elastography 4. Return for follow-up in 6 months    Thank you for allowing [place] to participate in the care of [place], DNP, AGNP-C Adult & Gerontological Nurse Practitioner [facility]   07/07/2020 4:28 PM   Disclaimer: This note was dictated with voice recognition software. Similar sounding words can inadvertently be transcribed and may not be corrected upon review.

## 2020-07-08 LAB — CBC WITH DIFFERENTIAL/PLATELET
Absolute Monocytes: 459 cells/uL (ref 200–950)
Basophils Absolute: 0 cells/uL (ref 0–200)
Basophils Relative: 0 %
Eosinophils Absolute: 0 cells/uL — ABNORMAL LOW (ref 15–500)
Eosinophils Relative: 0 %
HCT: 40.4 % (ref 38.5–50.0)
Hemoglobin: 14.2 g/dL (ref 13.2–17.1)
Lymphs Abs: 1589 cells/uL (ref 850–3900)
MCH: 33.7 pg — ABNORMAL HIGH (ref 27.0–33.0)
MCHC: 35.1 g/dL (ref 32.0–36.0)
MCV: 96 fL (ref 80.0–100.0)
MPV: 10.5 fL (ref 7.5–12.5)
Monocytes Relative: 10.2 %
Neutro Abs: 2453 cells/uL (ref 1500–7800)
Neutrophils Relative %: 54.5 %
Platelets: 157 10*3/uL (ref 140–400)
RBC: 4.21 10*6/uL (ref 4.20–5.80)
RDW: 13.6 % (ref 11.0–15.0)
Total Lymphocyte: 35.3 %
WBC: 4.5 10*3/uL (ref 3.8–10.8)

## 2020-07-08 LAB — HEPATIC FUNCTION PANEL
AG Ratio: 1.1 (calc) (ref 1.0–2.5)
ALT: 68 U/L — ABNORMAL HIGH (ref 9–46)
AST: 50 U/L — ABNORMAL HIGH (ref 10–35)
Albumin: 4.3 g/dL (ref 3.6–5.1)
Alkaline phosphatase (APISO): 135 U/L (ref 35–144)
Bilirubin, Direct: 0.4 mg/dL — ABNORMAL HIGH (ref 0.0–0.2)
Globulin: 3.8 g/dL (calc) — ABNORMAL HIGH (ref 1.9–3.7)
Indirect Bilirubin: 0.7 mg/dL (calc) (ref 0.2–1.2)
Total Bilirubin: 1.1 mg/dL (ref 0.2–1.2)
Total Protein: 8.1 g/dL (ref 6.1–8.1)

## 2020-07-08 LAB — BASIC METABOLIC PANEL
BUN: 19 mg/dL (ref 7–25)
CO2: 26 mmol/L (ref 20–32)
Calcium: 9.7 mg/dL (ref 8.6–10.3)
Chloride: 106 mmol/L (ref 98–110)
Creat: 1.31 mg/dL (ref 0.70–1.33)
Glucose, Bld: 81 mg/dL (ref 65–139)
Potassium: 4.1 mmol/L (ref 3.5–5.3)
Sodium: 141 mmol/L (ref 135–146)

## 2020-07-18 ENCOUNTER — Ambulatory Visit (HOSPITAL_COMMUNITY): Payer: 59

## 2020-07-18 ENCOUNTER — Encounter (HOSPITAL_COMMUNITY): Payer: Self-pay

## 2020-07-22 ENCOUNTER — Other Ambulatory Visit: Payer: Self-pay

## 2020-07-22 ENCOUNTER — Ambulatory Visit (HOSPITAL_COMMUNITY)
Admission: RE | Admit: 2020-07-22 | Discharge: 2020-07-22 | Disposition: A | Discharge status: Home / Self Care | Payer: 59 | Source: Ambulatory Visit | Attending: Nurse Practitioner | Admitting: Nurse Practitioner

## 2020-07-22 DIAGNOSIS — R1084 Generalized abdominal pain: Secondary | ICD-10-CM | POA: Diagnosis present

## 2020-07-22 DIAGNOSIS — K219 Gastro-esophageal reflux disease without esophagitis: Secondary | ICD-10-CM | POA: Diagnosis present

## 2020-07-22 DIAGNOSIS — K743 Primary biliary cirrhosis: Secondary | ICD-10-CM | POA: Insufficient documentation

## 2020-10-17 ENCOUNTER — Encounter: Payer: Self-pay | Admitting: Gastroenterology

## 2020-10-23 ENCOUNTER — Other Ambulatory Visit: Payer: Self-pay | Admitting: Gastroenterology

## 2020-10-23 DIAGNOSIS — K743 Primary biliary cirrhosis: Secondary | ICD-10-CM

## 2020-10-25 ENCOUNTER — Telehealth: Payer: Self-pay | Admitting: Family Medicine

## 2020-10-25 NOTE — Telephone Encounter (Signed)
Pt had a seizure while he was on vacation and the wife states it makes him sleepy and dizzy and not want to do anything. Pt is wanting to know if he can just take one pill instead of two. Pt was put on Levetiracetam. Please advise.

## 2020-10-25 NOTE — Telephone Encounter (Signed)
I called spoke to wife of pt.  Pt had seizure 10-11-20 while on vacation.   Notes in epic, no etoh, drugs, more sleep deprived, stress. Placed on keppra 1000mg  po qhs.  (SE drowsiness, no energy).  Made appt this 10-27-20 with Dr. Rexene Alberts 0730.

## 2020-10-27 ENCOUNTER — Ambulatory Visit: Payer: 59 | Admitting: Neurology

## 2020-10-27 NOTE — Telephone Encounter (Signed)
Pt's wife, Tylerjames Hoglund (on [place]) called, he had an appt for 10/27/20 but was cancelled by the provider. He  needs a sooner appt than June because of his seizures. Would like a call from the nurse.

## 2020-10-27 NOTE — Telephone Encounter (Signed)
I have called the pt's wife and expressed apologies  on the reschedule for today.   I advised I have his information and will call as I have a sooner availability. For good measure, I have scheduled him tentatively  scheduled him for 12/06/20 at 2 pm in the case we do not have an appointment open up.

## 2020-10-31 NOTE — Telephone Encounter (Signed)
I called pt and have scheduled appt for 11/01/20 at 100 pm. Pt advised to arrive 15-30 mins early for appt.

## 2020-11-01 ENCOUNTER — Encounter: Payer: Self-pay | Admitting: Neurology

## 2020-11-01 ENCOUNTER — Ambulatory Visit (INDEPENDENT_AMBULATORY_CARE_PROVIDER_SITE_OTHER): Payer: 59 | Admitting: Neurology

## 2020-11-01 VITALS — BP 154/98 | HR 98 | Ht 68.5 in | Wt 190.3 lb

## 2020-11-01 DIAGNOSIS — R351 Nocturia: Secondary | ICD-10-CM

## 2020-11-01 DIAGNOSIS — R0683 Snoring: Secondary | ICD-10-CM | POA: Diagnosis not present

## 2020-11-01 DIAGNOSIS — G40909 Epilepsy, unspecified, not intractable, without status epilepticus: Secondary | ICD-10-CM

## 2020-11-01 DIAGNOSIS — K743 Primary biliary cirrhosis: Secondary | ICD-10-CM

## 2020-11-01 MED ORDER — LEVETIRACETAM ER 500 MG PO TB24: 1000.0000 mg | ORAL_TABLET | Freq: Every day | ORAL | 3 refills | Status: DC

## 2020-11-01 NOTE — Patient Instructions (Addendum)
It was nice to see you again today.  I am sorry to hear that you have had another seizure.  You are on a medication which is, generally speaking, eliminated through the kidneys.  Nevertheless, we will monitor your labs, you get blood work regularly through your GI specialist as well.  We will do blood work today and call you with the results.  Since you have had sleepiness from the immediate release Keppra, we will switch you to the once daily long-acting.  You will take Keppra XR generic, 500 mg, take 2 pills at bedtime.  We will also proceed with a home sleep test to make sure you do not have an obvious sleep apnea.  If you have obstructive sleep apnea, you will likely benefit from using an AutoPap or CPAP like machine.  Please remember, common seizure triggers are: Sleep deprivation, dehydration, overheating, stress, hypoglycemia or skipping meals, certain medications or excessive alcohol use, especially stopping alcohol abruptly if you have had heavy alcohol use before (aka alcohol withdrawal seizure). If you have a prolonged seizure over 2-5 minutes or back to back seizures, call or have someone call 911 or take you to the nearest emergency room. You cannot drive a car or operate any other machinery or vehicle within 6 months of a seizure. Please do not swim alone or take a tub bath for safety. Do not climb on ladders or be at heights alone. Do not cook with large quantities of boiling water or oil for safety. Please ensure the water temperature at home is not too high. When carrying or caring for small children and infants, make sure you sit down when holding the child are feeding the child or changing them to minimize risk for injury to the child are to you if you were to have a seizure.  Take your medicine for seizure prevention regularly and do not skip doses or stop medication abruptly and tone are told to do so by your healthcare provider. Try to get a refill on your antiepileptic medication  ahead of time, so you are not at risk of running out. If you run out of the seizure medication and do not have a refill at hand she may run into medication withdrawal seizures. Avoid taking Wellbutrin, narcotic pain medications and tramadol, as they can lower seizure threshold.  As per [facility] statutes, patients with seizures and epilepsy are not allowed to drive until they have been seizure-free for at least 6 months. This also applies to driving or using heavy equipment or power tools.  Please follow-up routinely to see Amy or one of our nurse practitioners in 3 months, we can arrange for a virtual visit if you prefer.  That way you do not have to arrange for a ride to the clinic.

## 2020-11-01 NOTE — Progress Notes (Signed)
Subjective:    Patient ID: Jason Mcmahon is a 55 y.o. male.  HPI     Interim history:   Jason Mcmahon is a 55 year old right-hand gentleman with an underlying medical history of hypertension, gout, and overweight state, who presents for reevaluation of his seizure disorder.  He is accompanied by his wife today.  He had a recent emergency room visit for a Sz, while on vacation.  I first met him on 01/29/2019 at the request of his primary care physician, at which time patient reported to have a single seizure event which occurred while on vacation in [place].  His exam was nonfocal.  We talked about seizure precautions.  He was advised not to drive, he was advised to proceed with additional work-up in the form of brain MRI and EEG.    He had a brain MRI with and without contrast on 02/05/2019 and I reviewed the results:  IMPRESSION: 1. No acute intracranial abnormality or cause of seizures identified. 2. Mild chronic small vessel ischemic disease and cerebral atrophy.   We called with his results.  He had an EEG on 02/18/2019 and I reviewed the results:  Impression: This is a normal EEG recording in the waking and drowsy state. No evidence of ictal or interictal discharges are seen.   He was seen by Debbora Presto, NP in the interim on 07/13/2019, at which time he reported a diagnosis of primary biliary cholangitis.  He was in follow-up with GI.  He was advised to follow-up in this clinic as needed.  He was not on seizure medicine due to a single seizure event.  Today, 11/01/2020: He reports [place].  In the immediate seizure situation he did not have anybody with him that was related to him or a friend.  He was at the casino with a friend and friend's girlfriend, wife was shopping with theirs 15-year-old grandson.  EMS was called.  Unclear if he had a primary generalized seizure or not, no tongue bite, no bowel or bladder incontinence, did not have any head injuries.  He did  have retrograde amnesia for minutes before the seizure event.   He presented to an emergency room in [place], [place] on 10/11/2020 after a seizure.  He did not have any tongue bite or head injury or incontinence.  He did report lack of sleep due to stress.  He had a nonfocal exam, I reviewed the emergency room records.  Blood sugar in the emergency room was 81.  He was started on Keppra and was advised not to drive for 6 months so long as long he continues to be seizure-free.  He went back to the casino the next day without any problems.  He does report being at a race track before not feeling right, never actually had another seizure other than what we saw him for in 2020.  He has not been driving and wife reports that she takes him to and from work.  He reports feeling sleepy from the [employer] [employer].  He therefore takes it right after he comes home from work and then in the evening again. He sleeps well but does have a history of snoring and has nocturia about twice per average night.  To be fully rested, he needs about 8 or 9 hours of sleep, but has always been like this.  He has a strong family history of epilepsy affecting his father, passed away at age 53 from liver cirrhosis, patient  reports that father had a history of alcohol consumption.  Patient does not drink any alcohol.  His paternal aunt who is alive has epilepsy.  He reports that both paternal grandparents had seizures. He does not have to handle any heavy machinery at work or dangerous tools. He has been working full-time and has notified his Librarian, academic and colleagues at work about his condition. He does not use any illicit drugs.  He tries to hydrate well with water.  He has never had a sleep study.  He snores more noticeably when he sleeps on his back.  His preferred sleep position is on his stomach.  The patient's allergies, current medications, family history, past medical history, past social history, past surgical history and problem  list were reviewed and updated as appropriate.   Previously:   01/29/19: (He) reports a witnessed seizure event with postictal symptoms noted on 01/17/2019.  The seizure occurred when he was in [place], he was in a casino at the time.  He reports that they had just reached the casino.  He had been there once before.  His wife was away in [place] with their son and grandson.  He had gone with a friend and his girlfriend and patient had not been driving, it was about a 4-hour drive, he went to the bathroom and sat down to start playing, he was playing for about 10 minutes and then started having some blurry vision, tried to lean back because he felt he needed to focus differently with his eyes and that was the last thing he remembered.  Apparently he fell off the stool, he had a witnessed generalized seizure event which he believes was about 3 to 5 minutes by a later report back to him.  He was post ictal afterwards including groggy and does not recollect having conversations with EMS afterwards although he was talking to them.  They also called his wife apparently but he does not remember that.  He does remember the hospital work-up.  He also remembers being told that he could not drive for 6 months.  He was also told to have an EEG as an outpatient.  He denies any drinking alcohol or illicit drug use, in fact he reports that he does not drink alcohol at all. He has no prior history of epilepsy, no FHx of seizures.  He has 2 sons, his older son is from a previous relationship. He has not had any residual symptoms or further seizure-like events.  No sudden onset of one-sided weakness or numbness, no slurring of speech, no recurrent headaches.  He had a hospital admission in [place], [place].  I reviewed the hospital records as well as your office note from 01/22/2019 which you kindly included.  MRI brain without contrast on 01/18/2019 showed impression: No acute intracranial process.  Bilateral white matter  foci are more numerous than would be expected for patient's age.  Differential considerations include chronic microvascular ischemic changes, migrainous changes, among other etiologies.  CT head without contrast on 01/17/2019 showed: Negative CT head.  He had a left shoulder x-ray after his fall on 01/17/2019 which showed: Impression: Negative exam.  He had a neurologic consultation as I understand, I do not have those records.  He reports that he still has some left shoulder soreness.  He has an eye appointment pending for later this year, he has prescription trifocals.  He has not been driving a car, he is worried about not being able to drive for 6  months but is willing to commit to that.  He works in [employer], he works on [employer], has a 45-minute commute to work, has to be at work at 68.  He is in bed at 8 typically and rise time is around 430.  He does report snoring but feels like he is sleeping well.  He would be willing to ask about apneas and sleep disturbance from his wife's end of things when he talks to her at home.  He would be willing to proceed with an EEG and MRI with contrast.  He has a remote history of migraines when he was in his 21s in fact he had a migraine with left-sided weakness one time.  He now has rare migraine headaches, no one-sided weakness or numbness, does not take any medication for his migraines, tries to just sleep it off in a dark bedroom.  His Past Medical History Is Significant For: Past Medical History:  Diagnosis Date  . Gout   . Hypertension   . Primary biliary cholangitis ([place])     His Past Surgical History Is Significant For: Past Surgical History:  Procedure Laterality Date  . arm surgery    . COLONOSCOPY N/A 02/28/2016   Procedure: COLONOSCOPY;  Surgeon: Aviva Signs, MD;  [facility];  Service: Gastroenterology;  Laterality: N/A;  . COLONOSCOPY N/A 02/05/2020   Procedure: COLONOSCOPY;  Surgeon: Daneil Dolin, MD;  [facility]  [facility];  Service: Endoscopy;  Laterality: N/A;  12:30pm    His Family History Is Significant For: Family History  Problem Relation Age of Onset  . Liver disease Father        Alcoholic  . Hypertension Father   . Cancer Father   . Seizures Paternal Aunt   . Diabetes Paternal Grandmother   . Colon cancer Neg Hx     His Social History Is Significant For: Social History   Socioeconomic History  . Marital status: Married    Spouse name: Not on file  . Number of children: Not on file  . Years of education: Not on file  . Highest education level: Not on file  Occupational History  . Not on file  Tobacco Use  . Smoking status: Former Smoker    Packs/day: 3.00    Years: 18.00    Pack years: 54.00    Types: Cigarettes  . Smokeless tobacco: Never Used  Vaping Use  . Vaping Use: Never used  Substance and Sexual Activity  . Alcohol use: No  . Drug use: No  . Sexual activity: Not on file  Other Topics Concern  . Not on file  Social History Narrative  . Not on file   Social Determinants of Health   Financial Resource Strain: Not on file  Food Insecurity: Not on file  Transportation Needs: Not on file  Physical Activity: Not on file  Stress: Not on file  Social Connections: Not on file    His Allergies Are:  No Known Allergies:   His Current Medications Are:  Outpatient Encounter Medications as of 11/01/2020  Medication Sig  . allopurinol (ZYLOPRIM) 300 MG tablet Take 300 mg by mouth daily.  Marland Kitchen levETIRAcetam (KEPPRA XR) 500 MG 24 hr tablet Take 2 tablets (1,000 mg total) by mouth at bedtime.  Marland Kitchen lisinopril (ZESTRIL) 20 MG tablet Take 20 mg by mouth daily.  . ursodiol (ACTIGALL) 500 MG tablet TAKE 1 TABLET(500 MG) BY MOUTH TWICE DAILY  . [DISCONTINUED] levETIRAcetam (KEPPRA) 500 MG  tablet Take 500 mg by mouth 2 (two) times daily.   No facility-administered encounter medications on file as of 11/01/2020.  :  Review of Systems:  Out of a complete 14 point review of  systems, all are reviewed and negative with the exception of these symptoms as listed below: Review of Systems  Neurological:       Here for f/u on seizure event. Pt sts on 4/12/2 he was at a [place] in [place] and had a seizure. Pt does not remember much about the event and was not witnessed by any family/friends. Pt was take to ER and was started on Keppra 500 mg 1 tablet bid. Sts since starting the med he has been more tired, but tolerating it ok.     Objective:  Neurological Exam  Physical Exam Physical Examination:   Vitals:   11/01/20 1304  BP: (!) 154/98  Pulse: 98    General Examination: The patient is a very pleasant 55 y.o. male in no acute distress. He appears well-developed and well-nourished and well groomed.   HEENT: Normocephalic, atraumatic, pupils are equal, round and reactive to light, extraocular tracking is well-preserved, no nystagmus, face is symmetric with normal facial animation, speech is clear without dysarthria, hypophonia or voice tremor.  Airway examination reveals stable findings, mild airway crowding noted, tongue protrudes centrally and palate elevates symmetrically.   Chest: Clear to auscultation without wheezing, rhonchi or crackles noted.  Heart: S1+S2+0, regular and normal without murmurs, rubs or gallops noted.   Abdomen: Soft, non-tender and non-distended with normal bowel sounds appreciated on auscultation.  Extremities: There is no pitting edema in the distal lower extremities bilaterally.   Skin: Warm and dry without trophic changes noted.  Musculoskeletal: exam reveals no obvious joint deformities, tenderness or joint swelling or erythema.   Neurologically:  Mental status: The patient is awake, alert and oriented in all 4 spheres. His immediate and remote memory, attention, language skills and fund of knowledge are appropriate. There is no evidence of aphasia, agnosia, apraxia or anomia. Speech is clear with normal prosody and  enunciation. Thought process is linear. Mood is normal and affect is normal.  Cranial nerves II - XII are as described above under HEENT exam.  Motor exam: Normal bulk, strength and tone is noted. There is no drift, tremor or rebound. Romberg is negative. Reflexes are 2+ throughout. Fine motor skills and coordination: Grossly intact.   Cerebellar testing: No dysmetria or intention tremor on finger to nose testing. Heel to shin is unremarkable bilaterally. There is no truncal or gait ataxia.  Sensory exam: intact to light touch in the upper and lower extremities.  Gait, station and balance: He stands easily. No veering to one side is noted. No leaning to one side is noted. Posture is age-appropriate and stance is narrow based. Gait shows normal stride length and normal pace. No problems turning are noted. Tandem walk is unremarkable.    Assessment and Plan:   In summary, AUDLEY HINOJOS is a very pleasant 55 year old male with an underlying medical history of hypertension, primary biliary cholangitis and liver disease, gout, and overweight state, who presents for evaluation of his seizures.  He had a seizure event in 2020.  He has a family history of epilepsy.  He had a generalized seizure at the time with postictal confusion and amnesia.  He had a recent seizure event in April, it was essentially not witnessed by anybody that was with him at the time, wife was away  shopping and his friend was not immediately with him, he was taken to the emergency room via EMS.  He had retrograde amnesia.  Feels at baseline currently but does have some side effects from being on Keppra, particularly somnolence.  He has adjusted his schedule for taking his meds to take it essentially after he comes home from work.  He knows not to drive for total of 6 months so long as he stays seizure-free.  Exam is nonfocal and previously he had work-up with a brain MRI as well as EEG, both benign.  He is currently on generic Keppra 500  mg twice daily.  We talked about seizure precautions again today.  We talked about his triggers which could include sleep deprivation and excitement even.  He had been on vacation both times that the seizures happened.  He does not drink any alcohol and has a family history of liver disease.  He is very mindful of this.  He denies any drug use.  He is followed closely with GI.  He is willing to continue with medication.  We will check a level today but I suggested that we switch him to once daily long-acting Keppra 1000 mg in the evening to see if he tolerates it better from the sleepiness standpoint.  Given that he has snoring, I would like to evaluate him for sleep apnea.  Untreated sleep apnea can be an additional trigger for seizures in the susceptible person.  He reports a family history of epilepsy on his father's side of the family.  We will call him to schedule his sleep test, he would prefer a home sleep test which we will arrange.  We will consider AutoPap therapy if he has obstructive sleep apnea, he would be willing to pursue this.  He is willing to continue with medication and would like to switch to long-acting Keppra.  He is advised to follow-up routinely in 3 months to see one of our nurse practitioners.  If he is feeling well and is stable, we can certainly arrange for a virtual visit at the time to allow him to attend the visit from home.  He is relying on his wife to drive him to and from places at this time, he and his wife are both agreeable with the plan. I spent 40 minutes in total face-to-face time and in reviewing records during pre-charting, more than 50% of which was spent in counseling and coordination of care, reviewing test results, reviewing medications and treatment regimen and/or in discussing or reviewing the diagnosis of seizure d/o, snoring, sleep apnea concern, the prognosis and treatment options. Pertinent laboratory and imaging test results that were available during this  visit with the patient were reviewed by me and considered in my medical decision making (see chart for details).

## 2020-11-04 LAB — COMPREHENSIVE METABOLIC PANEL
ALT: 21 IU/L (ref 0–44)
AST: 24 IU/L (ref 0–40)
Albumin/Globulin Ratio: 1.4 (ref 1.2–2.2)
Albumin: 4.6 g/dL (ref 3.8–4.9)
Alkaline Phosphatase: 103 IU/L (ref 44–121)
BUN/Creatinine Ratio: 13 (ref 9–20)
BUN: 16 mg/dL (ref 6–24)
Bilirubin Total: 1.3 mg/dL — ABNORMAL HIGH (ref 0.0–1.2)
CO2: 23 mmol/L (ref 20–29)
Calcium: 9.4 mg/dL (ref 8.7–10.2)
Chloride: 106 mmol/L (ref 96–106)
Creatinine, Ser: 1.22 mg/dL (ref 0.76–1.27)
Globulin, Total: 3.4 g/dL (ref 1.5–4.5)
Glucose: 89 mg/dL (ref 65–99)
Potassium: 4.1 mmol/L (ref 3.5–5.2)
Sodium: 145 mmol/L — ABNORMAL HIGH (ref 134–144)
Total Protein: 8 g/dL (ref 6.0–8.5)
eGFR: 70 mL/min/{1.73_m2} (ref >59)

## 2020-11-04 LAB — CBC WITH DIFFERENTIAL/PLATELET
Basophils Absolute: 0 10*3/uL (ref 0.0–0.2)
Basos: 0 %
EOS (ABSOLUTE): 0 10*3/uL (ref 0.0–0.4)
Eos: 0 %
Hematocrit: 42.4 % (ref 37.5–51.0)
Hemoglobin: 14.9 g/dL (ref 13.0–17.7)
Immature Grans (Abs): 0 10*3/uL (ref 0.0–0.1)
Immature Granulocytes: 0 %
Lymphocytes Absolute: 1.4 10*3/uL (ref 0.7–3.1)
Lymphs: 27 %
MCH: 34.1 pg — ABNORMAL HIGH (ref 26.6–33.0)
MCHC: 35.1 g/dL (ref 31.5–35.7)
MCV: 97 fL (ref 79–97)
Monocytes Absolute: 0.4 10*3/uL (ref 0.1–0.9)
Monocytes: 7 %
Neutrophils Absolute: 3.5 10*3/uL (ref 1.4–7.0)
Neutrophils: 66 %
Platelets: 145 10*3/uL — ABNORMAL LOW (ref 150–450)
RBC: 4.37 x10E6/uL (ref 4.14–5.80)
RDW: 14.4 % (ref 11.6–15.4)
WBC: 5.3 10*3/uL (ref 3.4–10.8)

## 2020-11-04 LAB — LEVETIRACETAM LEVEL: Levetiracetam Lvl: 11.9 ug/mL (ref 10.0–40.0)

## 2020-11-07 ENCOUNTER — Telehealth: Payer: Self-pay

## 2020-11-07 NOTE — Progress Notes (Signed)
Labs were benign and stable, sodium slightly high at 145.  Please remind patient to stay well-hydrated with water.  Platelets were slightly low at 145, but he has had lower values before, something to continue to monitor on a regular basis.  Keppra level was in the therapeutic range.

## 2020-11-07 NOTE — Telephone Encounter (Signed)
LVM for pt to call me back to schedule sleep study  

## 2020-11-10 ENCOUNTER — Ambulatory Visit: Payer: 59 | Admitting: Family Medicine

## 2020-11-29 ENCOUNTER — Ambulatory Visit (INDEPENDENT_AMBULATORY_CARE_PROVIDER_SITE_OTHER): Payer: 59 | Admitting: Neurology

## 2020-11-29 DIAGNOSIS — R0683 Snoring: Secondary | ICD-10-CM

## 2020-11-29 DIAGNOSIS — G4733 Obstructive sleep apnea (adult) (pediatric): Secondary | ICD-10-CM

## 2020-11-29 DIAGNOSIS — G40909 Epilepsy, unspecified, not intractable, without status epilepticus: Secondary | ICD-10-CM

## 2020-11-29 DIAGNOSIS — R351 Nocturia: Secondary | ICD-10-CM

## 2020-11-30 NOTE — Progress Notes (Signed)
See procedure note.

## 2020-12-01 NOTE — Addendum Note (Signed)
Addended by: Star Age on: 12/01/2020 06:21 PM   Modules accepted: Orders

## 2020-12-01 NOTE — Procedures (Signed)
[facility] TEST (Watch PAT) REPORT  STUDY DATE: 11-29-20  DOB: 1966/06/14  MRN: [id number]  ORDERING CLINICIAN: Star Age, MD, PhD   REFERRING CLINICIAN: Cory Munch, PA-C   CLINICAL INFORMATION/HISTORY: 55 year old man with a history of hypertension, gout, seizure disorder and overweight state, who reports snoring and nocturia.   Epworth sleepiness score: N/A  BMI: 28.1 kg/m  Neck Circumference: N/A  FINDINGS:   Sleep Summary:   Total Recording Time (hours, min): 9 H 44 min  Total Sleep Time (hours, min):  8 H 40 min   Percent REM (%):    7.98 %   Respiratory Indices:   Calculated pAHI (per hour): 14.6/hour        REM pAHI: 30.5/hour      NREM pAHI: 13.5/hour  Oxygen Saturation Statistics:    Oxygen Saturation (%) Mean: 92%  Minimum oxygen saturation (%): 88%  O2 Saturation Range (%): 88-97%   O2 Saturation (minutes) <=88%: 0.2 min  Pulse Rate Statistics:   Pulse Mean (bpm): 62/min    Pulse Range (47-93/min)   IMPRESSION: OSA (obstructive sleep apnea), mild  RECOMMENDATION:  This home sleep test demonstrates overall mild obstructive sleep apnea with a total AHI of 14.6/hour and O2 nadir of 88%. Intermittent, mild to moderate snoring was note. Given the patient's medical history and sleep related complaints, treatment with positive airway pressure is recommended. This can be achieved in the form of autoPAP trial/titration at home. A  full night CPAP titration study will help with proper treatment settings and mask fitting if needed. Alternative treatments include weight loss along with avoidance of the supine sleep position, or an oral appliance in appropriate candidates.   Please note that untreated obstructive sleep apnea may carry additional perioperative morbidity. Patients with significant obstructive sleep apnea should receive perioperative PAP therapy and the surgeons and particularly the anesthesiologist should be  informed of the diagnosis and the severity of the sleep disordered breathing. The patient should be cautioned not to drive, work at heights, or operate dangerous or heavy equipment when tired or sleepy. Review and reiteration of good sleep hygiene measures should be pursued with any patient. Other causes of the patient's symptoms, including circadian rhythm disturbances, an underlying mood disorder, medication effect and/or an underlying medical problem cannot be ruled out based on this test. Clinical correlation is recommended.   The patient and his referring provider will be notified of the test results. The patient will be seen in follow up in sleep clinic at [facility].  I certify that I have reviewed the raw data recording prior to the issuance of this report in accordance with the standards of the American Academy of Sleep Medicine (AASM).   INTERPRETING PHYSICIAN:  Star Age, MD, PhD  Board Certified in Neurology and Sleep Medicine  [facility] [address] [place], [place] [address] [phone]     Sleep Summary  Oxygen Saturation Statistics   Start Study Time: End Study Time: Total Recording Time:  7:03:42 PM 4:47:51 AM 9 h, 44 min  Total Sleep Time % REM of Sleep Time:  8 h, 40 min  8.0    Mean: 92 Minimum: 88 Maximum: 97  Mean of Desaturations Nadirs (%):   91  Oxygen Desatur. %: 4-9 10-20 >20 Total  Events Number Total  28 100.0  0 0.0  0 0.0  28 100.0  Oxygen Saturation: <90 <=88 <85 <80 <70  Duration (minutes): Sleep % 3.0  0.2 0.6 0.0 0.0 0.0 0.0 0.0 0.0 0.0     Respiratory Indices      Total Events REM NREM All Night  pRDI: pAHI 3%: ODI 4%: pAHIc 3%: % CSR: pAHI 4%:  198  126  28  1 0.0 41 39.2 30.5 8.7 0.0 21.5 13.2 2.8 0.2 22.9 14.6 3.2 0.1  4.7       Pulse Rate Statistics during Sleep (BPM)      Mean: 62 Minimum: 47 Maximum: 93         Body Position Statistics  Position Supine Prone Right  Left Non-Supine  Sleep (min) 0.0 275.0 44.6 200.5 520.1  Sleep % 0.0 52.9 8.6 38.5 100.0  pRDI N/A 21.7 40.5 20.7 22.9  pAHI 3% N/A 13.6 33.8 11.7 14.6  ODI 4% N/A 2.8 6.8 3.0 3.2     Snoring Statistics Snoring Level (dB) >40 >50 >60 >70 >80 >Threshold (45)  Sleep (min) 303.6 1.9 0.6 0.0 0.0 3.2  Sleep % 58.4 0.4 0.1 0.0 0.0 0.6    Mean: 41 dB

## 2020-12-01 NOTE — Progress Notes (Signed)
Patient referred for Sz FU, seen by me on 11/01/20, HST on 11/29/20.    Please call and notify the patient that the recent home sleep test showed obstructive sleep apnea. OSA is overall mild, but worth treating to see if he feels better after treatment. To that end I recommend treatment for this in the form of autoPAP, which means, that we don't have to bring him in for a sleep study with CPAP, but will let him try an autoPAP machine at home, through a DME company (of his choice, or as per insurance requirement). The DME representative will educate him on how to use the machine, how to put the mask on, etc. I have placed an order in the chart. Please send referral, talk to patient, send report to referring MD. We will need a FU in sleep clinic for 10 weeks post-PAP set up, please arrange that with me or one of our NPs. Thanks,   Star Age, MD, PhD [facility])

## 2020-12-05 ENCOUNTER — Telehealth: Payer: Self-pay

## 2020-12-05 NOTE — Telephone Encounter (Signed)
I called the pt and we discussed the results of his sleep study. Pt verbalized understanding but sts he would like to take the time to think on this. Pt is worried wearing the autopap will disrupt his sleep and is hesitant. Pt is scheduled for seizure f/u with the doctor tomorrow, will keep as scheduled and discuss results further with provider as well.

## 2020-12-05 NOTE — Telephone Encounter (Signed)
-----   Message from Star Age, MD sent at 12/01/2020  6:21 PM EDT ----- Patient referred for Sz FU, seen by me on 11/01/20, HST on 11/29/20.    Please call and notify the patient that the recent home sleep test showed obstructive sleep apnea. OSA is overall mild, but worth treating to see if he feels better after treatment. To that end I recommend treatment for this in the form of autoPAP, which means, that we don't have to bring him in for a sleep study with CPAP, but will let him try an autoPAP machine at home, through a DME company (of his choice, or as per insurance requirement). The DME representative will educate him on how to use the machine, how to put the mask on, etc. I have placed an order in the chart. Please send referral, talk to patient, send report to referring MD. We will need a FU in sleep clinic for 10 weeks post-PAP set up, please arrange that with me or one of our NPs. Thanks,   Star Age, MD, PhD [facility])

## 2020-12-06 ENCOUNTER — Encounter: Payer: Self-pay | Admitting: Neurology

## 2020-12-06 ENCOUNTER — Ambulatory Visit (INDEPENDENT_AMBULATORY_CARE_PROVIDER_SITE_OTHER): Payer: 59 | Admitting: Neurology

## 2020-12-06 VITALS — BP 180/101 | HR 64 | Ht 68.5 in | Wt 190.0 lb

## 2020-12-06 DIAGNOSIS — G40909 Epilepsy, unspecified, not intractable, without status epilepticus: Secondary | ICD-10-CM | POA: Diagnosis not present

## 2020-12-06 DIAGNOSIS — G4733 Obstructive sleep apnea (adult) (pediatric): Secondary | ICD-10-CM | POA: Diagnosis not present

## 2020-12-06 NOTE — Progress Notes (Signed)
Subjective:    Patient ID: Jason Mcmahon is a 55 y.o. male.  HPI     Interim history:   Mr. 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  He is accompanied by his wife again today.  I last saw him on 11/01/2020 for sooner than scheduled appointment due to a recent seizure.  He was advised to continue with Keppra.  He was advised to continue with I suggested we proceed with a sleep study.  He had a home sleep test on 11/29/2020 which indicated overall mild obstructive sleep apnea with an AHI of 14.6/h, O2 nadir 88%.  He was advised to consider treatment with AutoPap.  Today, 12/06/2020: He reports sleeping well.  He is reluctant to consider AutoPap therapy.  Wife reports that he seems to sleep very well, he also reports that he is not a back sleeper and would not be able to use a mask sleeping on his back and he is worried that he would actually start sleeping with more interruption when he uses AutoPap therapy.  He is trying to hydrate well with water, he has been using a large bottle of water that holds 64 ounces and has done well with it.  He has lost a little bit of weight since increasing his water intake.  He tries to get about 8+ hours of sleep as he needs about 8-1/2 hours to feel fully rested.  He keeps up fairly set sleep schedule.  He has not had any recent seizures and is hoping to be able to resume driving by mid October.  He is tolerating the Keppra.  He has not had any side effects such as sleepiness or mood related issues.  The patient's allergies, current medications, family history, past medical history, past social history, past surgical history and problem list were reviewed and updated as appropriate.    Previously:    I first met him on 01/29/2019 at the request of his primary care physician, at which time patient reported to have a  single seizure event which occurred while on vacation in [place].  His exam was nonfocal.  We talked about seizure precautions.  He was advised not to drive, he was advised to proceed with additional work-up in the form of brain MRI and EEG.     He had a brain MRI with and without contrast on 02/05/2019 and I reviewed the results:  IMPRESSION: 1. No acute intracranial abnormality or cause of seizures identified. 2. Mild chronic small vessel ischemic disease and cerebral atrophy.   We called with his results.  He had an EEG on 02/18/2019 and I reviewed the results:  Impression: This is a normal EEG recording in the waking and drowsy state. No evidence of ictal or interictal discharges are seen.   He was seen by Debbora Presto, NP in the interim on 07/13/2019, at which time he reported a diagnosis of primary biliary cholangitis.  He was in follow-up with GI.  He was advised to follow-up in this clinic as needed.  He was not on seizure medicine due to a single seizure event.     01/29/19: (He) reports a witnessed seizure event with postictal symptoms noted on 01/17/2019.  The seizure occurred when he was in [place], he was in a casino at the time.  He reports that they had just reached the casino.  He had been  there once before.  His wife was away in [place] with their son and grandson.  He had gone with a friend and his girlfriend and patient had not been driving, it was about a 4-hour drive, he went to the bathroom and sat down to start playing, he was playing for about 10 minutes and then started having some blurry vision, tried to lean back because he felt he needed to focus differently with his eyes and that was the last thing he remembered.  Apparently he fell off the stool, he had a witnessed generalized seizure event which he believes was about 3 to 5 minutes by a later report back to him.  He was post ictal afterwards including groggy and does not recollect having conversations with EMS  afterwards although he was talking to them.  They also called his wife apparently but he does not remember that.  He does remember the hospital work-up.  He also remembers being told that he could not drive for 6 months.  He was also told to have an EEG as an outpatient.  He denies any drinking alcohol or illicit drug use, in fact he reports that he does not drink alcohol at all. He has no prior history of epilepsy, no FHx of seizures.  He has 2 sons, his older son is from a previous relationship. He has not had any residual symptoms or further seizure-like events.  No sudden onset of one-sided weakness or numbness, no slurring of speech, no recurrent headaches.  He had a hospital admission in [place], [place].  I reviewed the hospital records as well as your office note from 01/22/2019 which you kindly included.  MRI brain without contrast on 01/18/2019 showed impression: No acute intracranial process.  Bilateral white matter foci are more numerous than would be expected for patient's age.  Differential considerations include chronic microvascular ischemic changes, migrainous changes, among other etiologies.  CT head without contrast on 01/17/2019 showed: Negative CT head.  He had a left shoulder x-ray after his fall on 01/17/2019 which showed: Impression: Negative exam.  He had a neurologic consultation as I understand, I do not have those records.  He reports that he still has some left shoulder soreness.  He has an eye appointment pending for later this year, he has prescription trifocals.  He has not been driving a car, he is worried about not being able to drive for 6 months but is willing to commit to that.  He works in [employer], he works on [employer], has a 45-minute commute to work, has to be at work at 41.  He is in bed at 8 typically and rise time is around 430.  He does report snoring but feels like he is sleeping well.  He would be willing to ask about apneas and sleep disturbance from his  wife's end of things when he talks to her at home.  He would be willing to proceed with an EEG and MRI with contrast.  He has a remote history of migraines when he was in his 9s in fact he had a migraine with left-sided weakness one time.  He now has rare migraine headaches, no one-sided weakness or numbness, does not take any medication for his migraines, tries to just sleep it off in a dark bedroom.  His Past Medical History Is Significant For: Past Medical History:  Diagnosis Date  . Gout   . Hypertension   . Primary biliary cholangitis ([place])  His Past Surgical History Is Significant For: Past Surgical History:  Procedure Laterality Date  . arm surgery    . COLONOSCOPY N/A 02/28/2016   Procedure: COLONOSCOPY;  Surgeon: Aviva Signs, MD;  [facility];  Service: Gastroenterology;  Laterality: N/A;  . COLONOSCOPY N/A 02/05/2020   Procedure: COLONOSCOPY;  Surgeon: Daneil Dolin, MD;  Location: [facility];  Service: Endoscopy;  Laterality: N/A;  12:30pm    His Family History Is Significant For: Family History  Problem Relation Age of Onset  . Liver disease Father        Alcoholic  . Hypertension Father   . Cancer Father   . Seizures Paternal Aunt   . Diabetes Paternal Grandmother   . Colon cancer Neg Hx     His Social History Is Significant For: Social History   Socioeconomic History  . Marital status: Married    Spouse name: Not on file  . Number of children: Not on file  . Years of education: Not on file  . Highest education level: Not on file  Occupational History  . Not on file  Tobacco Use  . Smoking status: Former Smoker    Packs/day: 3.00    Years: 18.00    Pack years: 54.00    Types: Cigarettes  . Smokeless tobacco: Never Used  Vaping Use  . Vaping Use: Never used  Substance and Sexual Activity  . Alcohol use: No  . Drug use: No  . Sexual activity: Not on file  Other Topics Concern  . Not on file  Social History Narrative  . Not on  file   Social Determinants of Health   Financial Resource Strain: Not on file  Food Insecurity: Not on file  Transportation Needs: Not on file  Physical Activity: Not on file  Stress: Not on file  Social Connections: Not on file    His Allergies Are:  No Known Allergies:   His Current Medications Are:  Outpatient Encounter Medications as of 12/06/2020  Medication Sig  . allopurinol (ZYLOPRIM) 300 MG tablet Take 300 mg by mouth daily.  Marland Kitchen levETIRAcetam (KEPPRA XR) 500 MG 24 hr tablet Take 2 tablets (1,000 mg total) by mouth at bedtime.  Marland Kitchen lisinopril (ZESTRIL) 20 MG tablet Take 20 mg by mouth daily.  . ursodiol (ACTIGALL) 500 MG tablet TAKE 1 TABLET(500 MG) BY MOUTH TWICE DAILY   No facility-administered encounter medications on file as of 12/06/2020.  :  Review of Systems:  Out of a complete 14 point review of systems, all are reviewed and negative with the exception of these symptoms as listed below: Review of Systems  Neurological:       Here for f/u. Reports seizures have been stable. Reports several concerns when it comes to starting the autopap.    Objective:  Neurological Exam  Physical Exam Physical Examination:   Vitals:   12/06/20 1412  BP: (!) 180/101  Pulse: 64    General Examination: The patient is a very pleasant 55 y.o. male in no acute distress. He appears well-developed and well-nourished and well groomed.   HEENT:Normocephalic, atraumatic, pupils are equal, round and reactive to light, extraocular tracking is well-preserved, no nystagmus, face is symmetric with normal facial animation, speech is clear without dysarthria, hypophonia or voice tremor.  Airway examination reveals stable findings, mild airway crowding noted, tongue protrudes centrally and palate elevates symmetrically.   Chest:Clear to auscultation without wheezing, rhonchi or crackles noted.  Heart:S1+S2+0, regular and normal without murmurs, rubs  or gallops noted.   Abdomen:Soft,  non-tender and non-distended.  Extremities:There isnopitting edema in the distal lower extremities bilaterally.   Skin: Warm and dry without trophic changes noted.  Musculoskeletal: exam reveals no obvious joint deformities.   Neurologically:  Mental status: The patient is awake, alert and oriented in all 4 spheres.Hisimmediate and remote memory, attention, language skills and fund of knowledge are appropriate. There is no evidence of aphasia, agnosia, apraxia or anomia. Speech is clear with normal prosody and enunciation. Thought process is linear. Mood is normaland affect is normal.  Cranial nerves II - XII are as described above under HEENT exam.  Motor exam: Normal bulk, strength and tone is noted. There is no drift, tremor or rebound. Romberg is negative. Reflexes are 1-2+ throughout. Fine motor skills and coordination: Grossly intact.   Cerebellar testing: No dysmetria or intention tremor on finger to nose testing. Heel to shin is unremarkable bilaterally. There is no truncal or gait ataxia.  Sensory exam: intact to light touch in the upper and lower extremities.  Gait, station and balance:Hestands easily. No veering to one side is noted. No leaning to one side is noted. Posture is age-appropriate and stance is narrow based. Gait showsnormalstride length and normalpace. No problems turning are noted. Tandem walk is unremarkable.   Assessmentand Plan:   In summary,Maury D Dockeryis a very pleasant 63 year oldmalewith an underlying medical history of hypertension, primary biliary cholangitis and liver disease, gout, and overweight state, whopresents for  follow-up consultation of his sleep apnea.  He was found to have mild obstructive sleep apnea by home sleep testing on 11/29/2020, AHI was 14.6/h, O2 nadir 88%.  He does not feel like using AutoPap with help at this time, he does not have much in the way of sleep related issues, wife reports that he seems to sleep fairly  well.  We talked about sleep apnea, its prognosis and treatment options and I explained to him that there is an option to treat mild sleep apnea but we do not have to have him try AutoPap at this time.  Should he have changes in his symptoms such as sleep disruption, daytime somnolence, difficulty maintaining or initiating sleep, we can certainly reconsider treatment for sleep apnea with an AutoPap machine.  He is advised to continue to follow seizure precautions, not drive for 6 months at least until he achieves seizure freedom for 6 months.  He is compliant with his Keppra, prescription is up-to-date.  Recheck labs last month.  He is advised to follow-up in mid October for a routine follow-up for his seizure and call us with any interim questions or concerns.  He is advised to stay well rested and well-hydrated with water, he reports that he has done better with his hydration.  I answered all their questions today and the patient and his wife were in agreement with the plan.  I spent 20 minutes in total face-to-face time and in reviewing records during pre-charting, more than 50% of which was spent in counseling and coordination of care, reviewing test results, reviewing medications and treatment regimen and/or in discussing or reviewing the diagnosis of seizure d/o, mild OSA, the prognosis and treatment options. Pertinent laboratory and imaging test results that were available during this visit with the patient were reviewed by me and considered in my medical decision making (see chart for details).

## 2020-12-06 NOTE — Patient Instructions (Addendum)
It was nice to see you both again today.  As discussed, we can have you monitor your sleep related symptoms.  If you feel that you snore more or you do not wake up rested, we can consider sleep apnea treatment with an AutoPap machine at the time.  For now, we can have you monitor your symptoms, we do not have to treat you for your mild sleep apnea.  Please continue to work on maintaining good hydration with water and try to stay well rested.  Continue with your Keppra XR 500 mg strength 2 pills daily.  Your prescription is up-to-date for 90 days with refills.  Please follow-up routinely for seizure checkup in mid October.  Please remember that you are not to drive until 6 months of seizure freedom is achieved.

## 2021-01-03 NOTE — Progress Notes (Signed)
Referring Provider: Ginger Organ Primary Care Physician:  Cory Munch, PA-C Primary GI Physician: Dr. Gala Romney  Chief Complaint  Patient presents with   Gastroesophageal Reflux    Doing ok. Takes new otc Zantac prn   PBC    HPI:   Jason Mcmahon is a 55 y.o. male presenting today for follow-up of PBC.  He has history of PBC with elevated alk phos, positive AMA, and positive ASMA.  ANA negative.  Liver biopsy in December 2020 with findings compatible with PBC though not diagnostic.  Possible overlap autoimmune hepatitis or drug-induced liver injury also considered.  Also with mild fibrosis (stage I-2 of 4) he started on Urso 500 mg twice daily. Also with history of adenomatous colon polyps, due for repeat in 2026.  Last seen in our office 07/07/2020.  Noted intermittent pain in the left lower rib, not particular bothersome, but wanted [place] to be aware.  Intermittent GERD taking OTC medications as needed.  No other significant GI symptoms.  Due to mildly low platelets, queried early cirrhosis.  Plan to update labs and ultrasound elastography, follow-up in 6 months.  Labs completed 07/07/2020.  CBC essentially normal.  Platelets 157 (up from 137).  Electrolytes and kidney function within normal limits.  AST and ALT elevated at 50 and 68 respectively (up from 18 and 16, 6 months prior), alk phos and T bili within normal limits, direct bili slightly elevated at 0.4. RUQ ultrasound with elastography 07/22/2020: Liver appears normal. kPa 9.6.  Today:  Had a seizure in April. Started Keppra. Doing well since.   No GI concerns.  Denies fatigue, pruritus, swelling in his lower extremities or abdomen, yellowing of the eyes or skin, easy bruising/bleeding, changes in mental status/confusion.    Admits to occasional reflux with spicy foods such as pizza or BBQ and uses Zantac 360 as needed which works well.  Usually avoids these foods so he does not need Zantac very often. No dysphagia,  nausea, vomiting, or abdominal pain. No diarrhea, constipation, brbpr, or melena.   Completed Hep A/B vaccines.  No alcohol. No tylenol.   Past Medical History:  Diagnosis Date   Gout    Hypertension    Primary biliary cholangitis ([place])    Seizures ([place])    started on Keppra in April 2022    Past Surgical History:  Procedure Laterality Date   arm surgery     COLONOSCOPY N/A 02/28/2016   Surgeon: Aviva Signs, MD; 2 mm tubular adenoma in the ascending colon, 8 mm tubular adenoma in the sigmoid colon, otherwise normal exam.  Repeat in 3 years.   COLONOSCOPY N/A 02/05/2020   Surgeon: Daneil Dolin, MD;  Diverticulosis in the sigmoid and descending colon, otherwise normal exam, repeat in 5 years.    Current Outpatient Medications  Medication Sig Dispense Refill   allopurinol (ZYLOPRIM) 300 MG tablet Take 300 mg by mouth daily.     famotidine (ZANTAC 360) 10 MG tablet Take 10 mg by mouth as needed for heartburn or indigestion.     levETIRAcetam (KEPPRA XR) 500 MG 24 hr tablet Take 2 tablets (1,000 mg total) by mouth at bedtime. 180 tablet 3   lisinopril (ZESTRIL) 20 MG tablet Take 20 mg by mouth daily.     ursodiol (ACTIGALL) 500 MG tablet TAKE 1 TABLET(500 MG) BY MOUTH TWICE DAILY 60 tablet 5   No current facility-administered medications for this visit.    Allergies as of 01/04/2021   (No  Known Allergies)    Family History  Problem Relation Age of Onset   Liver disease Father        Alcoholic   Hypertension Father    Cancer Father    Lung cancer Father    Diabetes Paternal Grandmother    Seizures Paternal Aunt    Colon cancer Neg Hx     Social History   Socioeconomic History   Marital status: Married    Spouse name: Not on file   Number of children: Not on file   Years of education: Not on file   Highest education level: Not on file  Occupational History   Not on file  Tobacco Use   Smoking status: Former    Packs/day: 3.00    Years: 18.00    Pack years:  54.00    Types: Cigarettes   Smokeless tobacco: Never  Vaping Use   Vaping Use: Never used  Substance and Sexual Activity   Alcohol use: No   Drug use: No   Sexual activity: Not on file  Other Topics Concern   Not on file  Social History Narrative   Not on file   Social Determinants of Health   Financial Resource Strain: Not on file  Food Insecurity: Not on file  Transportation Needs: Not on file  Physical Activity: Not on file  Stress: Not on file  Social Connections: Not on file    Review of Systems: Gen: Denies fever, chills, cold or flulike symptoms, presyncope, syncope. CV: Denies chest pain patient's. Resp: Denies dyspnea or cough. GI: See HPI. Derm: Denies rash, itching Heme: See HPI  Physical Exam: BP (!) 149/87   Pulse 63   Temp 97.8 F (36.6 C) (Temporal)   Ht '5\' 9"'  (1.753 m)   Wt 192 lb 12.8 oz (87.5 kg)   BMI 28.47 kg/m  General:   Alert and oriented. No distress noted. Pleasant and cooperative.  Head:  Normocephalic and atraumatic. Eyes:  Conjuctiva clear without scleral icterus. Heart:  S1, S2 present without murmurs appreciated. Lungs:  Clear to auscultation bilaterally. No wheezes, rales, or rhonchi. No distress.  Abdomen: +BS, soft, non-tender and non-distended. No rebound or guarding. No HSM or masses noted. Msk:  Symmetrical without gross deformities. Normal posture. Extremities:  Without edema. Neurologic:  Alert and  oriented x4 Psych:  Normal mood and affect.   Assessment: 55 year old male presenting today for follow-up of PBC.  He was diagnosed in 2020.  He had elevated LFTs/alk phos, positive ANA, positive ASMA, ANA negative, liver biopsy December 2020 with findings compatible with PBC though not diagnostic, query overlap autoimmune hepatitis versus drug-induced liver injury though clinically felt to be most consistent with PBC.  Mild fibrosis on biopsy (stage I-2 of 4).  Started on ursodiol 500 mg twice daily and LFTs/alk phos normalized  in March 2021.  Slight bump of AST and ALT in January 2022, but normalized May 2022.  Slight elevation of T bili at 1.3 on 5/22. There is some question of early cirrhosis as he has mild thrombocytopenia.  Ultrasound with elastography Jan 2022 with normal appearing liver with median Pa 9.6. No prior EGD. Clinically, he has done very well and has no significant symptoms related to [place] or decompensated liver disease.  Denies alcohol use and reports he has completed hepatitis A and B vaccination.  At this time we will plan to update labs, obtain US abdomen complete to evaluate for splenomegaly, and complete routine maintenance screening for PBC including DEXA  scan, TSH, check a baseline for Vitamin A and D deficiencies, and 6 month [place]. According to 2018 PBC guidelines, EGD is recommended for patients with cirrhosis or kPa score greater than 17.  If evidence of splenomegaly, could consider erring on the side of caution and arranging EGD versus monitoring elastography yearly.  Will discuss with Dr. Gala Romney.  Heartburn: Occasional heartburn related to known dietary triggers. Infrequent and no alarm symptoms. Well controlled on Zantac 360 prn.   Plan:  CBC, HFP, INR, TSH, vitamin A, vitamin D 25 hydroxy DEXA scan Abdominal ultrasound Pending lab and ultrasound results, will discuss with Dr. Gala Romney on the role of EGD versus monitoring fibrosis progression/cirrhosis with yearly elastography. Continue ursodiol 500 mg twice daily. Continue using Zantac 360 as needed.  Monitor for increased frequency of heartburn and let us know if this occurs. Follow-up in 6 months or sooner if needed.   Aliene Altes, PA-C [facility] Gastroenter[facility] 01/04/2021

## 2021-01-04 ENCOUNTER — Encounter: Payer: Self-pay | Admitting: Internal Medicine

## 2021-01-04 ENCOUNTER — Encounter: Payer: Self-pay | Admitting: Gastroenterology

## 2021-01-04 ENCOUNTER — Ambulatory Visit: Payer: 59 | Admitting: Nurse Practitioner

## 2021-01-04 ENCOUNTER — Ambulatory Visit (INDEPENDENT_AMBULATORY_CARE_PROVIDER_SITE_OTHER): Payer: 59 | Admitting: Gastroenterology

## 2021-01-04 ENCOUNTER — Other Ambulatory Visit: Payer: Self-pay

## 2021-01-04 VITALS — BP 149/87 | HR 63 | Temp 97.8°F | Ht 69.0 in | Wt 192.8 lb

## 2021-01-04 DIAGNOSIS — K743 Primary biliary cirrhosis: Secondary | ICD-10-CM | POA: Diagnosis not present

## 2021-01-04 DIAGNOSIS — R12 Heartburn: Secondary | ICD-10-CM | POA: Diagnosis not present

## 2021-01-04 NOTE — Patient Instructions (Addendum)
Please have blood work completed at [facility].  We will arrange for you to have an ultrasound of your abdomen and a bone density scan in the near future at [facility] taking ursodiol 500 mg twice daily.  You may continue taking Zantac 360 as needed for heartburn.  Continue to avoid known dietary triggers of heartburn.  Let us know if your symptoms become more frequent.  We will plan to see you back in 6 months.  Do not hesitate to call if you have any questions or concerns prior to your next visit.  It was great to meet you today!  Aliene Altes, PA-C [facility] Gastroenterology

## 2021-01-05 ENCOUNTER — Encounter: Payer: Self-pay | Admitting: Gastroenterology

## 2021-01-06 LAB — HEPATIC FUNCTION PANEL
AG Ratio: 1.5 (calc) (ref 1.0–2.5)
ALT: 14 U/L (ref 9–46)
AST: 16 U/L (ref 10–35)
Albumin: 4.6 g/dL (ref 3.6–5.1)
Alkaline phosphatase (APISO): 90 U/L (ref 35–144)
Bilirubin, Direct: 0.3 mg/dL — ABNORMAL HIGH (ref 0.0–0.2)
Globulin: 3.1 g/dL (calc) (ref 1.9–3.7)
Indirect Bilirubin: 0.9 mg/dL (calc) (ref 0.2–1.2)
Total Bilirubin: 1.2 mg/dL (ref 0.2–1.2)
Total Protein: 7.7 g/dL (ref 6.1–8.1)

## 2021-01-06 LAB — VITAMIN A: Vitamin A (Retinoic Acid): 29 ug/dL — ABNORMAL LOW (ref 38–98)

## 2021-01-06 LAB — TSH: TSH: 3.32 mIU/L (ref 0.40–4.50)

## 2021-01-06 LAB — CBC WITH DIFFERENTIAL/PLATELET
Absolute Monocytes: 445 cells/uL (ref 200–950)
Basophils Absolute: 21 cells/uL (ref 0–200)
Basophils Relative: 0.5 %
Eosinophils Absolute: 0 cells/uL — ABNORMAL LOW (ref 15–500)
Eosinophils Relative: 0 %
HCT: 41.6 % (ref 38.5–50.0)
Hemoglobin: 14.6 g/dL (ref 13.2–17.1)
Lymphs Abs: 1457 cells/uL (ref 850–3900)
MCH: 34.2 pg — ABNORMAL HIGH (ref 27.0–33.0)
MCHC: 35.1 g/dL (ref 32.0–36.0)
MCV: 97.4 fL (ref 80.0–100.0)
MPV: 9.8 fL (ref 7.5–12.5)
Monocytes Relative: 10.6 %
Neutro Abs: 2276 cells/uL (ref 1500–7800)
Neutrophils Relative %: 54.2 %
Platelets: 129 10*3/uL — ABNORMAL LOW (ref 140–400)
RBC: 4.27 10*6/uL (ref 4.20–5.80)
RDW: 13.8 % (ref 11.0–15.0)
Total Lymphocyte: 34.7 %
WBC: 4.2 10*3/uL (ref 3.8–10.8)

## 2021-01-06 LAB — PROTIME-INR
INR: 1
Prothrombin Time: 10.1 s (ref 9.0–11.5)

## 2021-01-06 LAB — VITAMIN D 25 HYDROXY (VIT D DEFICIENCY, FRACTURES): Vit D, 25-Hydroxy: 27 ng/mL — ABNORMAL LOW (ref 30–100)

## 2021-01-12 ENCOUNTER — Other Ambulatory Visit: Payer: Self-pay

## 2021-01-12 ENCOUNTER — Ambulatory Visit (HOSPITAL_COMMUNITY)
Admission: RE | Admit: 2021-01-12 | Discharge: 2021-01-12 | Disposition: A | Discharge status: Home / Self Care | Payer: 59 | Source: Ambulatory Visit | Attending: Gastroenterology | Admitting: Gastroenterology

## 2021-01-12 DIAGNOSIS — K743 Primary biliary cirrhosis: Secondary | ICD-10-CM | POA: Insufficient documentation

## 2021-01-20 ENCOUNTER — Telehealth: Payer: Self-pay | Admitting: Gastroenterology

## 2021-01-20 NOTE — Telephone Encounter (Signed)
[facility] clinical pool: Please let patient know I have reviewed his case with Dr. Gala Romney.  Dr. Gala Romney has agreed that it would be appropriate for Jason Mcmahon to go ahead and move forward with scheduling an EGD to screen for esophageal varices due to history of PBC with fibrosis.  I had discussed this possibility with the patient at his last office visit.  Please arrange EGD with Dr. Gala Romney. Dx: PBC with fibrosis ASA II

## 2021-01-23 NOTE — Telephone Encounter (Signed)
Called pt. He was agreeable to EGD. Scheduled for 8/24 at 7:30am. Aware will mail instructions.   PA approved via [facility]. Auth# [id number], DOS Feb 22, 2021 - May 23, 2021

## 2021-01-23 NOTE — Telephone Encounter (Signed)
Called pt. Made aware 8/24 is 8:30am start day. Procedure time changed. Aware to arrive at 7:30am. He voiced understanding

## 2021-01-24 ENCOUNTER — Other Ambulatory Visit: Payer: Self-pay | Admitting: *Deleted

## 2021-01-24 ENCOUNTER — Telehealth: Payer: Self-pay | Admitting: Internal Medicine

## 2021-01-24 DIAGNOSIS — R7989 Other specified abnormal findings of blood chemistry: Secondary | ICD-10-CM

## 2021-01-24 NOTE — Telephone Encounter (Signed)
Communication noted.  

## 2021-01-24 NOTE — Telephone Encounter (Signed)
Pt's wife, Helene Kelp, called asking if patient was scheduled a procedure and what was it for and should the patient be concerned. I told her patient had OV recently and was aware of his procedure and I would have the nurse call the patient. [phone]

## 2021-01-24 NOTE — Telephone Encounter (Signed)
Spoke to pt's wife (listed on [place]).  Made aware of lab results from 01/04/2021.  Pt advised to start Mens One a Day.  She said he was aware and had.  She was made aware that he needs repeat labs in 3 months.  Made her aware that bone denisty normal.  She was made aware that he needs EGD for screening purposes.  She voiced understanding.

## 2021-02-22 ENCOUNTER — Encounter (HOSPITAL_COMMUNITY): Payer: Self-pay | Admitting: Internal Medicine

## 2021-02-22 ENCOUNTER — Ambulatory Visit (HOSPITAL_COMMUNITY)
Admission: RE | Admit: 2021-02-22 | Discharge: 2021-02-22 | Disposition: A | Discharge status: Home / Self Care | Payer: 59 | Attending: Internal Medicine | Admitting: Internal Medicine

## 2021-02-22 ENCOUNTER — Encounter (HOSPITAL_COMMUNITY)
Admission: RE | Disposition: A | Discharge status: Home / Self Care | Payer: Self-pay | Source: Home / Self Care | Attending: Internal Medicine

## 2021-02-22 ENCOUNTER — Other Ambulatory Visit: Payer: Self-pay

## 2021-02-22 DIAGNOSIS — Z87891 Personal history of nicotine dependence: Secondary | ICD-10-CM | POA: Insufficient documentation

## 2021-02-22 DIAGNOSIS — K449 Diaphragmatic hernia without obstruction or gangrene: Secondary | ICD-10-CM | POA: Insufficient documentation

## 2021-02-22 DIAGNOSIS — K7460 Unspecified cirrhosis of liver: Secondary | ICD-10-CM | POA: Diagnosis not present

## 2021-02-22 DIAGNOSIS — K2210 Ulcer of esophagus without bleeding: Secondary | ICD-10-CM | POA: Diagnosis not present

## 2021-02-22 DIAGNOSIS — Z79899 Other long term (current) drug therapy: Secondary | ICD-10-CM | POA: Diagnosis not present

## 2021-02-22 HISTORY — PX: ESOPHAGOGASTRODUODENOSCOPY: SHX5428

## 2021-02-22 SURGERY — EGD (ESOPHAGOGASTRODUODENOSCOPY)
Anesthesia: Moderate Sedation

## 2021-02-22 MED ORDER — LIDOCAINE VISCOUS HCL 2 % MT SOLN
OROMUCOSAL | Status: AC
  Filled 2021-02-22: qty 15

## 2021-02-22 MED ORDER — ONDANSETRON HCL 4 MG/2ML IJ SOLN: 4.0000 mg | Freq: Once | INTRAMUSCULAR | Status: AC

## 2021-02-22 MED ORDER — ONDANSETRON HCL 4 MG/2ML IJ SOLN
INTRAMUSCULAR | Status: AC
  Administered 2021-02-22: 4 mg via INTRAVENOUS
  Filled 2021-02-22: qty 2

## 2021-02-22 MED ORDER — SODIUM CHLORIDE 0.9 % IV SOLN: INTRAVENOUS | Status: DC

## 2021-02-22 MED ORDER — MIDAZOLAM HCL 5 MG/5ML IJ SOLN
INTRAMUSCULAR | Status: AC
  Filled 2021-02-22: qty 10

## 2021-02-22 MED ORDER — ONDANSETRON HCL 4 MG/2ML IJ SOLN
INTRAMUSCULAR | Status: AC
  Filled 2021-02-22: qty 2

## 2021-02-22 MED ORDER — ONDANSETRON HCL 4 MG/2ML IJ SOLN
INTRAMUSCULAR | Status: DC | PRN
  Administered 2021-02-22: 4 mg via INTRAVENOUS

## 2021-02-22 MED ORDER — MEPERIDINE HCL 100 MG/ML IJ SOLN
INTRAMUSCULAR | Status: DC | PRN
  Administered 2021-02-22: 40 mg via INTRAVENOUS
  Administered 2021-02-22: 10 mg via INTRAVENOUS

## 2021-02-22 MED ORDER — MEPERIDINE HCL 50 MG/ML IJ SOLN
INTRAMUSCULAR | Status: AC
  Filled 2021-02-22: qty 1

## 2021-02-22 MED ORDER — LIDOCAINE VISCOUS HCL 2 % MT SOLN
OROMUCOSAL | Status: DC | PRN
  Administered 2021-02-22: 5 mL via OROMUCOSAL

## 2021-02-22 MED ORDER — MIDAZOLAM HCL 5 MG/5ML IJ SOLN
INTRAMUSCULAR | Status: DC | PRN
  Administered 2021-02-22: 1 mg via INTRAVENOUS
  Administered 2021-02-22: 2 mg via INTRAVENOUS
  Administered 2021-02-22: 1 mg via INTRAVENOUS

## 2021-02-22 MED ORDER — STERILE WATER FOR IRRIGATION IR SOLN
Status: DC | PRN
  Administered 2021-02-22: 1.5 mL

## 2021-02-22 NOTE — Discharge Instructions (Signed)
EGD Discharge instructions Please read the instructions outlined below and refer to this sheet in the next few weeks. These discharge instructions provide you with general information on caring for yourself after you leave the hospital. Your doctor may also give you specific instructions. While your treatment has been planned according to the most current medical practices available, unavoidable complications occasionally occur. If you have any problems or questions after discharge, please call your doctor. ACTIVITY You may resume your regular activity but move at a slower pace for the next 24 hours.  Take frequent rest periods for the next 24 hours.  Walking will help expel (get rid of) the air and reduce the bloated feeling in your abdomen.  No driving for 24 hours (because of the anesthesia (medicine) used during the test).  You may shower.  Do not sign any important legal documents or operate any machinery for 24 hours (because of the anesthesia used during the test).  NUTRITION Drink plenty of fluids.  You may resume your normal diet.  Begin with a light meal and progress to your normal diet.  Avoid alcoholic beverages for 24 hours or as instructed by your caregiver.  MEDICATIONS You may resume your normal medications unless your caregiver tells you otherwise.  WHAT YOU CAN EXPECT TODAY You may experience abdominal discomfort such as a feeling of fullness or "gas" pains.  FOLLOW-UP Your doctor will discuss the results of your test with you.  SEEK IMMEDIATE MEDICAL ATTENTION IF ANY OF THE FOLLOWING OCCUR: Excessive nausea (feeling sick to your stomach) and/or vomiting.  Severe abdominal pain and distention (swelling).  Trouble swallowing.  Temperature over 101 F (37.8 C).  Rectal bleeding or vomiting of blood.    You have mild acid irritation in your esophagus  GERD lifestyle/diet information provided  You do not have any esophageal varices which is good news.  Recommend  follow-up in our office in [place] your blood pressure medications as soon as you get home  At patient request, I called Jerrald Ting at [phone] -rolled to voicemail.  I left a message.

## 2021-02-22 NOTE — OR Nursing (Signed)
Pt c/o nausea and cold sweats. O2 saturation 88% on room air. Applied 2L [place], O2 saturation 92%. Denies any shortness of breath, no distress noted. Pt tolerating sprite. Dr. Gala Romney notified and aware. See orders. Will continue to monitor.

## 2021-02-22 NOTE — H&P (Signed)
$'@LOGO'T$ @   Primary Care Physician:  Ginger Organ Primary Gastroenterologist:  Dr. Gala Romney  Pre-Procedure History & Physical: HPI:  Jason Mcmahon is a 55 y.o. male here for screening EGD.  History of PBC with splenomegaly.  Patient has no upper GI tract symptoms.  Past Medical History:  Diagnosis Date   Gout    Hypertension    Primary biliary cholangitis ([place])    Seizures ([place])    started on Keppra in April 2022    Past Surgical History:  Procedure Laterality Date   arm surgery     COLONOSCOPY N/A 02/28/2016   Surgeon: Aviva Signs, MD; 2 mm tubular adenoma in the ascending colon, 8 mm tubular adenoma in the sigmoid colon, otherwise normal exam.  Repeat in 3 years.   COLONOSCOPY N/A 02/05/2020   Surgeon: Daneil Dolin, MD;  Diverticulosis in the sigmoid and descending colon, otherwise normal exam, repeat in 5 years.    Prior to Admission medications   Medication Sig Start Date End Date Taking? Authorizing Provider  allopurinol (ZYLOPRIM) 300 MG tablet Take 300 mg by mouth daily. 09/07/19  Yes [provider]  famotidine (PEPCID) 10 MG tablet Take 10 mg by mouth as needed for heartburn or indigestion.   Yes [provider]  levETIRAcetam (KEPPRA XR) 500 MG 24 hr tablet Take 2 tablets (1,000 mg total) by mouth at bedtime. 11/01/20  Yes Star Age, MD  lisinopril (ZESTRIL) 20 MG tablet Take 20 mg by mouth daily.   Yes [provider]  sildenafil (VIAGRA) 25 MG tablet Take 25-100 mg by mouth daily as needed for erectile dysfunction. 01/25/21  Yes [provider]  ursodiol (ACTIGALL) 500 MG tablet TAKE 1 TABLET(500 MG) BY MOUTH TWICE DAILY 10/24/20  Yes Erenest Rasher, PA-C    Allergies as of 01/23/2021   (No Known Allergies)    Family History  Problem Relation Age of Onset   Liver disease Father        Alcoholic   Hypertension Father    Cancer Father    Lung cancer Father    Diabetes Paternal Grandmother    Seizures Paternal  Aunt    Colon cancer Neg Hx     Social History   Socioeconomic History   Marital status: Married    Spouse name: Not on file   Number of children: Not on file   Years of education: Not on file   Highest education level: Not on file  Occupational History   Not on file  Tobacco Use   Smoking status: Former    Packs/day: 3.00    Years: 18.00    Pack years: 54.00    Types: Cigarettes   Smokeless tobacco: Never  Vaping Use   Vaping Use: Never used  Substance and Sexual Activity   Alcohol use: No   Drug use: No   Sexual activity: Not on file  Other Topics Concern   Not on file  Social History Narrative   Not on file   Social Determinants of Health   Financial Resource Strain: Not on file  Food Insecurity: Not on file  Transportation Needs: Not on file  Physical Activity: Not on file  Stress: Not on file  Social Connections: Not on file  Intimate Partner Violence: Not on file    Review of Systems: See HPI, otherwise negative ROS  Physical Exam: BP (!) 182/97   Pulse 62   Temp 97.9 F (36.6 C) (Oral)   Resp 13  Ht 5' 8.5" (1.74 m)   Wt 87.1 kg   SpO2 96%   BMI 28.77 kg/m  General:   Alert,  Well-developed, well-nourished, pleasant and cooperative in NAD SNeck:  Supple; no masses or thyromegaly. No significant cervical adenopathy. Lungs:  Clear throughout to auscultation.   No wheezes, crackles, or rhonchi. No acute distress. Heart:  Regular rate and rhythm; no murmurs, clicks, rubs,  or gallops. Abdomen: Non-distended, normal bowel sounds.  Soft and nontender without appreciable mass or hepatosplenomegaly.  Pulses:  Normal pulses noted. Extremities:  Without clubbing or edema.  Impression/Plan: 55 year old gentleman here with a history of PBC/possible overlap syndrome here for screening EGD.  Noted splenomegaly.  No dysphagia.  Have offered the patient screening EGD today per plan. The risks, benefits, limitations, alternatives and imponderables have been  reviewed with the patient. Potential for esophageal dilation, biopsy, etc. have also been reviewed.  Questions have been answered. All parties agreeable.      Notice: This dictation was prepared with Dragon dictation along with smaller phrase technology. Any transcriptional errors that result from this process are unintentional and may not be corrected upon review.

## 2021-02-22 NOTE — Op Note (Signed)
[facility] Patient Name: Jason Mcmahon Procedure Date: 02/22/2021 7:49 AM MRN: [id number] Date of Birth: 10/24/1965 Attending MD: Norvel Richards , MD CSN: [id number] Age: 55 Admit Type: Outpatient Procedure:                Upper GI endoscopy Indications:              Cirrhosis with suspected esophageal varices Providers:                Norvel Richards, MD, Jeanann Lewandowsky. Sharon Seller, RN,                            Raphael Gibney, Technician Referring MD:              Medicines:                Meperidine 50 mg IV, Midazolam 4 mg IV Complications:            No immediate complications. Estimated Blood Loss:     Estimated blood loss: none. Procedure:                Pre-Anesthesia Assessment:                           - Prior to the procedure, a History and Physical                            was performed, and patient medications and                            allergies were reviewed. The patient's tolerance of                            previous anesthesia was also reviewed. The risks                            and benefits of the procedure and the sedation                            options and risks were discussed with the patient.                            All questions were answered, and informed consent                            was obtained. Prior Anticoagulants: The patient has                            taken no previous anticoagulant or antiplatelet                            agents. ASA Grade Assessment: II - A patient with                            mild systemic disease. After reviewing the risks  and benefits, the patient was deemed in                            satisfactory condition to undergo the procedure.                           After obtaining informed consent, the endoscope was                            passed under direct vision. Throughout the                            procedure, the patient's blood pressure, pulse, and                             oxygen saturations were monitored continuously. The                            [id number]-[id number]) scope was introduced through the                            mouth, and advanced to the second part of duodenum.                            The upper GI endoscopy was accomplished without                            difficulty. The patient tolerated the procedure                            well. Scope In: 8:58:46 AM Scope Out: 9:01:44 AM Total Procedure Duration: 0 hours 2 minutes 58 seconds  Findings:      Single 5 mm erosion straddling the GE junction. Otherwise, the tubular       esophagus appeared entirely normal.      A small hiatal hernia was present. Gastric cavity otherwise appeared       normal.      The duodenal bulb and second portion of the duodenum were normal. Impression:               - Small hiatal hernia.                           - Normal duodenal bulb and second portion of the                            duodenum.                           - No specimens collected. Moderate Sedation:      Moderate (conscious) sedation was administered by the endoscopy nurse       and supervised by the endoscopist. The following parameters were       monitored: oxygen saturation, heart rate, blood pressure, respiratory       rate, EKG, adequacy of pulmonary ventilation, and response to care. Recommendation:           -  Patient has a contact number available for                            emergencies. The signs and symptoms of potential                            delayed complications were discussed with the                            patient. Return to normal activities tomorrow.                            Written discharge instructions were provided to the                            patient.                           - Advance diet as tolerated.                           - Continue present medications.                           - Return to my office in 4 months.  Clinical                            progress including lab, imaging findings would                            guide timing of any future endoscopy. Procedure Code(s):        --- Professional ---                           [id number], Esophagogastroduodenoscopy, flexible,                            transoral; diagnostic, including collection of                            specimen(s) by brushing or washing, when performed                            (separate procedure) Diagnosis Code(s):        --- Professional ---                           K44.9, Diaphragmatic hernia without obstruction or                            gangrene                           K74.60, Unspecified cirrhosis of liver CPT copyright 2019 [employer]. All rights reserved. The codes documented in this report are preliminary and upon coder review may  be revised to meet current compliance  requirements. Cristopher Estimable. Mariachristina Holle, MD Norvel Richards, MD 02/22/2021 9:11:33 AM This report has been signed electronically. Number of Addenda: 0

## 2021-02-24 ENCOUNTER — Ambulatory Visit: Payer: Self-pay | Admitting: *Deleted

## 2021-03-02 ENCOUNTER — Encounter (HOSPITAL_COMMUNITY): Payer: Self-pay | Admitting: Internal Medicine

## 2021-03-13 ENCOUNTER — Other Ambulatory Visit: Payer: Self-pay

## 2021-03-13 DIAGNOSIS — R7989 Other specified abnormal findings of blood chemistry: Secondary | ICD-10-CM

## 2021-04-12 ENCOUNTER — Ambulatory Visit (INDEPENDENT_AMBULATORY_CARE_PROVIDER_SITE_OTHER): Payer: 59 | Admitting: Neurology

## 2021-04-12 ENCOUNTER — Encounter: Payer: Self-pay | Admitting: Neurology

## 2021-04-12 ENCOUNTER — Other Ambulatory Visit: Payer: Self-pay

## 2021-04-12 VITALS — BP 174/91 | HR 63 | Ht 68.5 in | Wt 189.6 lb

## 2021-04-12 DIAGNOSIS — G40909 Epilepsy, unspecified, not intractable, without status epilepticus: Secondary | ICD-10-CM

## 2021-04-12 NOTE — Progress Notes (Signed)
Subjective:    Patient ID: Jason Mcmahon is a 55 y.o. male.  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  He is unaccompanied today (wife brought him, she is in the parking lot with their grandbaby).  I last saw him on 12/06/2020, at which time he reported sleeping well.  We talked about his home sleep test results from 11/29/2020 indicating mild to near moderate obstructive sleep apnea by number of events with an AHI of 14.6/h, O2 nadir 88%.  He was advised to consider treatment with AutoPap.  He declined treatment at the time.  He was advised to continue with his Keppra for seizure prevention.  He indicated no recent seizures.  Today, 04/12/2021: He reports doing well, no recent seizures, no seizure-like episodes, no new complaints, no neurological symptoms, trying to hydrate well, has been drinking more water and has lost a little bit of weight, working on weight loss.  He has been working full-time in the office, he makes computer chips.  He had some accommodation from his boss and was able to work from home on Thursdays.  He generally works Sundays through Thursdays every week.  He is hoping to resume driving as of today, as he will be 6 months out from his last seizure.  He denies any recurrent headaches or difficulty tolerating the Keppra, reports full compliance.  He has been sleeping fairly well.  He has regular checkup with his liver specialist.  He had a change in his GI specialist as his original doctor left the practice.  He had an ultrasound and blood work recently.  Ultrasound of the abdomen on 01/04/2021 showed:IMPRESSION: Probable fatty infiltration of liver as above.   Splenomegaly, not significantly changed.   Remainder of exam unremarkable.  He does not drink any alcohol, he limits his  caffeine.  The patient's allergies, current medications, family history, past medical history, past social history, past surgical history and problem list were reviewed and updated as appropriate.    Previously:   I saw him on 11/01/2020 for sooner than scheduled appointment due to a recent seizure.  He was advised to continue with Keppra.  He was advised to continue with I suggested we proceed with a sleep study.  He had a home sleep test on 11/29/2020 which indicated overall mild obstructive sleep apnea with an AHI of 14.6/h, O2 nadir 88%.  He was advised to consider treatment with AutoPap.       I first met him on 01/29/2019 at the request of his primary care physician, at which time patient reported to have a single seizure event which occurred while on vacation in [place].  His exam was nonfocal.  We talked about seizure precautions.  He was advised not to drive, he was advised to proceed with additional work-up in the form of brain MRI and EEG.     He had a brain MRI with and without contrast on 02/05/2019 and I reviewed the results:  IMPRESSION: 1. No acute intracranial abnormality or cause of seizures identified. 2. Mild chronic small vessel ischemic disease and cerebral atrophy.   We called with his results.  He had an EEG on 02/18/2019 and I reviewed the results:  Impression: This is a normal EEG recording in the waking and drowsy state. No evidence of ictal or interictal discharges are  seen.   He was seen by Debbora Presto, NP in the interim on 07/13/2019, at which time he reported a diagnosis of primary biliary cholangitis.  He was in follow-up with GI.  He was advised to follow-up in this clinic as needed.  He was not on seizure medicine due to a single seizure event.     01/29/19: (He) reports a witnessed seizure event with postictal symptoms noted on 01/17/2019.  The seizure occurred when he was in [place], he was in a casino at the time.  He reports that they had just reached the  casino.  He had been there once before.  His wife was away in [place] with their son and grandson.  He had gone with a friend and his girlfriend and patient had not been driving, it was about a 4-hour drive, he went to the bathroom and sat down to start playing, he was playing for about 10 minutes and then started having some blurry vision, tried to lean back because he felt he needed to focus differently with his eyes and that was the last thing he remembered.  Apparently he fell off the stool, he had a witnessed generalized seizure event which he believes was about 3 to 5 minutes by a later report back to him.  He was post ictal afterwards including groggy and does not recollect having conversations with EMS afterwards although he was talking to them.  They also called his wife apparently but he does not remember that.  He does remember the hospital work-up.  He also remembers being told that he could not drive for 6 months.  He was also told to have an EEG as an outpatient.  He denies any drinking alcohol or illicit drug use, in fact he reports that he does not drink alcohol at all. He has no prior history of epilepsy, no FHx of seizures.  He has 2 sons, his older son is from a previous relationship. He has not had any residual symptoms or further seizure-like events.  No sudden onset of one-sided weakness or numbness, no slurring of speech, no recurrent headaches.  He had a hospital admission in [place], [place].  I reviewed the hospital records as well as your office note from 01/22/2019 which you kindly included.  MRI brain without contrast on 01/18/2019 showed impression: No acute intracranial process.  Bilateral white matter foci are more numerous than would be expected for patient's age.  Differential considerations include chronic microvascular ischemic changes, migrainous changes, among other etiologies.  CT head without contrast on 01/17/2019 showed: Negative CT head.  He had a left shoulder x-ray after  his fall on 01/17/2019 which showed: Impression: Negative exam.  He had a neurologic consultation as I understand, I do not have those records.  He reports that he still has some left shoulder soreness.  He has an eye appointment pending for later this year, he has prescription trifocals.  He has not been driving a car, he is worried about not being able to drive for 6 months but is willing to commit to that.  He works in [place], he works on [employer], has a 45-minute commute to work, has to be at work at 71.  He is in bed at 8 typically and rise time is around 430.  He does report snoring but feels like he is sleeping well.  He would be willing to ask about apneas and sleep disturbance from his wife's end of things when  he talks to her at home.  He would be willing to proceed with an EEG and MRI with contrast.  He has a remote history of migraines when he was in his 63s in fact he had a migraine with left-sided weakness one time.  He now has rare migraine headaches, no one-sided weakness or numbness, does not take any medication for his migraines, tries to just sleep it off in a dark bedroom.  His Past Medical History Is Significant For: Past Medical History:  Diagnosis Date   Gout    Hypertension    Primary biliary cholangitis ([place])    Seizures ([place])    started on Keppra in April 2022    His Past Surgical History Is Significant For: Past Surgical History:  Procedure Laterality Date   arm surgery     COLONOSCOPY N/A 02/28/2016   Surgeon: Aviva Signs, MD; 2 mm tubular adenoma in the ascending colon, 8 mm tubular adenoma in the sigmoid colon, otherwise normal exam.  Repeat in 3 years.   COLONOSCOPY N/A 02/05/2020   Surgeon: Daneil Dolin, MD;  Diverticulosis in the sigmoid and descending colon, otherwise normal exam, repeat in 5 years.   ESOPHAGOGASTRODUODENOSCOPY N/A 02/22/2021   Procedure: ESOPHAGOGASTRODUODENOSCOPY (EGD);  Surgeon: Daneil Dolin, MD;  Location[facility];  Service: Endoscopy;  Laterality: N/A;  7:30am    His Family History Is Significant For: Family History  Problem Relation Age of Onset   Liver disease Father        Alcoholic   Hypertension Father    Cancer Father    Lung cancer Father    Diabetes Paternal Grandmother    Seizures Paternal Aunt    Colon cancer Neg Hx     His Social History Is Significant For: Social History   Socioeconomic History   Marital status: Married    Spouse name: Not on file   Number of children: Not on file   Years of education: Not on file   Highest education level: Not on file  Occupational History   Not on file  Tobacco Use   Smoking status: Former    Packs/day: 3.00    Years: 18.00    Pack years: 54.00    Types: Cigarettes   Smokeless tobacco: Never  Vaping Use   Vaping Use: Never used  Substance and Sexual Activity   Alcohol use: No   Drug use: No   Sexual activity: Not on file  Other Topics Concern   Not on file  Social History Narrative   Not on file   Social Determinants of Health   Financial Resource Strain: Not on file  Food Insecurity: Not on file  Transportation Needs: Not on file  Physical Activity: Not on file  Stress: Not on file  Social Connections: Not on file    His Allergies Are:  Allergies  Allergen Reactions   Aspirin Anaphylaxis  :   His Current Medications Are:  Outpatient Encounter Medications as of 04/12/2021  Medication Sig   allopurinol (ZYLOPRIM) 300 MG tablet Take 300 mg by mouth daily.   famotidine (PEPCID) 10 MG tablet Take 10 mg by mouth as needed for heartburn or indigestion.   levETIRAcetam (KEPPRA XR) 500 MG 24 hr tablet Take 2 tablets (1,000 mg total) by mouth at bedtime.   lisinopril (ZESTRIL) 20 MG tablet Take 20 mg by mouth daily.   sildenafil (VIAGRA) 25 MG tablet Take 25-100 mg by mouth daily as needed for erectile dysfunction.  ursodiol (ACTIGALL) 500 MG tablet TAKE 1 TABLET(500 MG) BY MOUTH TWICE DAILY   No  facility-administered encounter medications on file as of 04/12/2021.  :  Review of Systems:  Out of a complete 14 point review of systems, all are reviewed and negative with the exception of these symptoms as listed below:  Review of Systems  Neurological:  Positive for seizures.       No seizures.  ESS 3, FSS 24.   Objective:  Neurological Exam  Physical Exam Physical Examination:   Vitals:   04/12/21 1424  BP: (!) 174/91  Pulse: 63    General Examination: The patient is a very pleasant 55 y.o. male in no acute distress. He appears well-developed and well-nourished and well groomed.   HEENT: Normocephalic, atraumatic, pupils are equal, round and reactive to light, extraocular tracking is well-preserved, no nystagmus, face is symmetric with normal facial animation, speech is clear without dysarthria, hypophonia or voice tremor.  Airway examination reveals mild mouth dryness, tongue protrudes centrally and palate elevates symmetrically.  No carotid bruits.  Neck is supple with full range of motion.   Chest: Clear to auscultation without wheezing, rhonchi or crackles noted.   Heart: S1+S2+0, regular and normal without murmurs, rubs or gallops noted.    Abdomen: Soft, non-tender and non-distended.   Extremities: There is no pitting edema in the distal lower extremities bilaterally.    Skin: Warm and dry without trophic changes noted.   Musculoskeletal: exam reveals no obvious joint deformities.    Neurologically:  Mental status: The patient is awake, alert and oriented in all 4 spheres. His immediate and remote memory, attention, language skills and fund of knowledge are appropriate. There is no evidence of aphasia, agnosia, apraxia or anomia. Speech is clear with normal prosody and enunciation. Thought process is linear. Mood is normal and affect is normal.  Cranial nerves II - XII are as described above under HEENT exam.  Motor exam: Normal bulk, strength and tone is noted.  There is no drift, tremor or rebound. Romberg is negative. Reflexes are 1-2+ throughout. Fine motor skills and coordination: Grossly intact, in particular, normal finger taps, hand movements, rapid alternating patting and foot taps.Cerebellar testing: No dysmetria or intention tremor on finger to nose testing. Heel to shin is unremarkable bilaterally. There is no truncal or gait ataxia.  Sensory exam: intact to light touch in the upper and lower extremities.  Gait, station and balance: He stands easily. No veering to one side is noted. No leaning to one side is noted. Posture is age-appropriate and stance is narrow based. Gait shows normal stride length and normal pace. No problems turning are noted. Tandem walk is unremarkable.     Assessment and Plan:    In summary, Jason Mcmahon is a very pleasant 56 year old male with an underlying medical history of hypertension, primary biliary cholangitis and liver disease, gout, and overweight state, who presents for follow-up consultation of his seizure disorder.  He had a seizure 6 months ago.  He has been seizure-free since then.  He has been on Keppra XR 1000 mg at night.  He has tolerated this medication and reports full compliance, prior Keppra level in May 2022 was stable and within therapeutic window.  He was found to have mild obstructive sleep apnea by home sleep testing on 11/29/2020, AHI was 14.6/h, O2 nadir 88%.  He declined AutoPap therapy.  He is working on weight loss.  He sleeps well.  We again talked about  seizure precautions and the driving limitation if someone has a seizure or epilepsy and has not been seizure-free.  Since he has been seizure-free for at least 6 months at this time, he can resume driving.  We talked about using good judgment, he is again advised not to be at heights or use heavy machinery, he is advised to not take any tub baths and not to swim alone, not to use any large vessels with boiling water or oil to cook.  He is agreeable  to resuming driving.  He is advised to proceed to the lab to get his [place] level checked.  We will also keep an eye on his CBC and kidney function on a regular basis.  He had blood work in the past 3 months through his liver specialist.  He does have slightly low platelets that are being monitored.  He did not quite need a prescription for his Keppra.  He is advised to follow-up in this clinic to see one of our nurse practitioners in 6 months, sooner if needed.  We can also offer him a video visit appointment for his next visit if the need arises.  He is reminded to stay well rested and well-hydrated.  He is advised not to drive when feeling sleepy.  I answered all his questions today and he was given written instructions as well.  He was in agreement with the plan. I spent 30 minutes in total face-to-face time and in reviewing records during pre-charting, more than 50% of which was spent in counseling and coordination of care, reviewing test results, reviewing medications and treatment regimen and/or in discussing or reviewing the diagnosis of Sz d/o, the prognosis and treatment options. Pertinent laboratory and imaging test results that were available during this visit with the patient were reviewed by me and considered in my medical decision making (see chart for details).

## 2021-04-12 NOTE — Patient Instructions (Signed)
It was nice to see you again today.  I am glad to hear that you have remained seizure-free and that you continue to tolerate your Keppra.  Please continue with your long-acting Keppra, 2 pills at bedtime which is 1000 mg total dose.  We will do a Keppra level today, you have had recent blood work in July.  Please remember, common seizure triggers are: Sleep deprivation, dehydration, overheating, stress, hypoglycemia or skipping meals, certain medications or excessive alcohol use, especially stopping alcohol abruptly if you have had heavy alcohol use before (aka alcohol withdrawal seizure). If you have a prolonged seizure over 2-5 minutes or back to back seizures, call or have someone call 911 or take you to the nearest emergency room. You cannot drive a car or operate any other machinery or vehicle within 6 months of a seizure. Please do not swim alone or take a tub bath for safety. Do not climb on ladders or be at heights alone. Do not cook with large quantities of boiling water or oil for safety. Please ensure the water temperature at home is not too high. When carrying or caring for small children and infants, make sure you sit down when holding the child are feeding the child or changing them to minimize risk for injury to the child are to you if you were to have a seizure.  Take your medicine for seizure prevention regularly and do not skip doses or stop medication abruptly and tone are told to do so by your healthcare provider. Try to get a refill on your antiepileptic medication ahead of time, so you are not at risk of running out. If you run out of the seizure medication and do not have a refill at hand she may run into medication withdrawal seizures. Avoid taking Wellbutrin, narcotic pain medications and tramadol, as they can lower seizure threshold.  As per [facility] statutes, patients with seizures and epilepsy are not allowed to drive until they have been seizure-free for at least 6 months.  This also applies to driving or using heavy equipment or power tools. Continue healthy lifestyle, hydrate well with water.  Please follow-up in 6 months to see Amy again. If need be, we can convert your appointment to a video visit through [place]. You can resume driving.  Please use your good judgment and do not drive when sleep deprived or feeling sleepy.

## 2021-04-13 ENCOUNTER — Encounter: Payer: Self-pay | Admitting: *Deleted

## 2021-04-13 LAB — LEVETIRACETAM LEVEL: Levetiracetam Lvl: 13.9 ug/mL (ref 10.0–40.0)

## 2021-04-24 ENCOUNTER — Other Ambulatory Visit: Payer: Self-pay | Admitting: Gastroenterology

## 2021-04-24 DIAGNOSIS — K743 Primary biliary cirrhosis: Secondary | ICD-10-CM

## 2021-04-25 ENCOUNTER — Other Ambulatory Visit: Payer: Self-pay | Admitting: Gastroenterology

## 2021-04-25 DIAGNOSIS — K743 Primary biliary cirrhosis: Secondary | ICD-10-CM

## 2021-04-26 ENCOUNTER — Telehealth: Payer: Self-pay | Admitting: Internal Medicine

## 2021-04-26 NOTE — Telephone Encounter (Signed)
LMOM for pt to call office back 

## 2021-04-26 NOTE — Telephone Encounter (Signed)
Noted  

## 2021-04-26 NOTE — Telephone Encounter (Signed)
Refill prescription for ursodiol 500 mg twice daily.

## 2021-04-26 NOTE — Telephone Encounter (Signed)
Pt's wife, Helene Kelp, came by front desk saying that the pharmacy had faxed [place] yesterday about his liver medication refill and they are going out of town today and need to pick up medicine before they leave. [facility] [address]. Please call [phone]

## 2021-04-26 NOTE — Telephone Encounter (Signed)
Spoke to pt's wife Helene Kelp) [place]. Informed her that prescription was sent to the pharmacy. Informed her to call office if any problems.

## 2021-06-25 ENCOUNTER — Encounter (HOSPITAL_COMMUNITY): Payer: Self-pay

## 2021-06-25 ENCOUNTER — Observation Stay (HOSPITAL_COMMUNITY)
Admission: EM | Admit: 2021-06-25 | Discharge: 2021-06-26 | Disposition: A | Discharge status: Home / Self Care | Payer: 59 | Attending: Internal Medicine | Admitting: Internal Medicine

## 2021-06-25 ENCOUNTER — Emergency Department (HOSPITAL_COMMUNITY): Payer: 59

## 2021-06-25 ENCOUNTER — Other Ambulatory Visit: Payer: Self-pay

## 2021-06-25 DIAGNOSIS — I10 Essential (primary) hypertension: Secondary | ICD-10-CM | POA: Diagnosis present

## 2021-06-25 DIAGNOSIS — R059 Cough, unspecified: Secondary | ICD-10-CM | POA: Diagnosis present

## 2021-06-25 DIAGNOSIS — R0902 Hypoxemia: Secondary | ICD-10-CM

## 2021-06-25 DIAGNOSIS — G40909 Epilepsy, unspecified, not intractable, without status epilepticus: Secondary | ICD-10-CM

## 2021-06-25 DIAGNOSIS — Z87891 Personal history of nicotine dependence: Secondary | ICD-10-CM | POA: Insufficient documentation

## 2021-06-25 DIAGNOSIS — J209 Acute bronchitis, unspecified: Principal | ICD-10-CM

## 2021-06-25 DIAGNOSIS — J439 Emphysema, unspecified: Secondary | ICD-10-CM

## 2021-06-25 DIAGNOSIS — J449 Chronic obstructive pulmonary disease, unspecified: Secondary | ICD-10-CM | POA: Insufficient documentation

## 2021-06-25 DIAGNOSIS — J440 Chronic obstructive pulmonary disease with acute lower respiratory infection: Secondary | ICD-10-CM | POA: Diagnosis not present

## 2021-06-25 DIAGNOSIS — K219 Gastro-esophageal reflux disease without esophagitis: Secondary | ICD-10-CM | POA: Diagnosis not present

## 2021-06-25 DIAGNOSIS — Z79899 Other long term (current) drug therapy: Secondary | ICD-10-CM | POA: Insufficient documentation

## 2021-06-25 DIAGNOSIS — R0602 Shortness of breath: Secondary | ICD-10-CM | POA: Insufficient documentation

## 2021-06-25 DIAGNOSIS — Z20822 Contact with and (suspected) exposure to covid-19: Secondary | ICD-10-CM | POA: Diagnosis not present

## 2021-06-25 LAB — CBC WITH DIFFERENTIAL/PLATELET
Abs Immature Granulocytes: 0.04 10*3/uL (ref 0.00–0.07)
Basophils Absolute: 0 10*3/uL (ref 0.0–0.1)
Basophils Relative: 1 %
Eosinophils Absolute: 0.2 10*3/uL (ref 0.0–0.5)
Eosinophils Relative: 2 %
HCT: 41.8 % (ref 39.0–52.0)
Hemoglobin: 15.1 g/dL (ref 13.0–17.0)
Immature Granulocytes: 1 %
Lymphocytes Relative: 19 %
Lymphs Abs: 1.6 10*3/uL (ref 0.7–4.0)
MCH: 35.2 pg — ABNORMAL HIGH (ref 26.0–34.0)
MCHC: 36.1 g/dL — ABNORMAL HIGH (ref 30.0–36.0)
MCV: 97.4 fL (ref 80.0–100.0)
Monocytes Absolute: 1 10*3/uL (ref 0.1–1.0)
Monocytes Relative: 12 %
Neutro Abs: 5.4 10*3/uL (ref 1.7–7.7)
Neutrophils Relative %: 65 %
Platelets: 164 10*3/uL (ref 150–400)
RBC: 4.29 MIL/uL (ref 4.22–5.81)
RDW: 14 % (ref 11.5–15.5)
WBC: 8.2 10*3/uL (ref 4.0–10.5)
nRBC: 0 % (ref 0.0–0.2)

## 2021-06-25 LAB — CBC
HCT: 42.6 % (ref 39.0–52.0)
Hemoglobin: 14.8 g/dL (ref 13.0–17.0)
MCH: 33.9 pg (ref 26.0–34.0)
MCHC: 34.7 g/dL (ref 30.0–36.0)
MCV: 97.7 fL (ref 80.0–100.0)
Platelets: 153 10*3/uL (ref 150–400)
RBC: 4.36 MIL/uL (ref 4.22–5.81)
RDW: 13.6 % (ref 11.5–15.5)
WBC: 6.5 10*3/uL (ref 4.0–10.5)
nRBC: 0 % (ref 0.0–0.2)

## 2021-06-25 LAB — BASIC METABOLIC PANEL
Anion gap: 12 (ref 5–15)
BUN: 12 mg/dL (ref 6–20)
CO2: 20 mmol/L — ABNORMAL LOW (ref 22–32)
Calcium: 9.2 mg/dL (ref 8.9–10.3)
Chloride: 103 mmol/L (ref 98–111)
Creatinine, Ser: 1.07 mg/dL (ref 0.61–1.24)
GFR, Estimated: >60 mL/min (ref >60)
Glucose, Bld: 99 mg/dL (ref 70–99)
Potassium: 3.6 mmol/L (ref 3.5–5.1)
Sodium: 135 mmol/L (ref 135–145)

## 2021-06-25 LAB — RESP PANEL BY RT-PCR (FLU A&B, COVID) ARPGX2
Influenza A by PCR: NEGATIVE
Influenza B by PCR: NEGATIVE
SARS Coronavirus 2 by RT PCR: NEGATIVE

## 2021-06-25 LAB — CREATININE, SERUM
Creatinine, Ser: 1.2 mg/dL (ref 0.61–1.24)
GFR, Estimated: >60 mL/min (ref >60)

## 2021-06-25 MED ORDER — ENOXAPARIN SODIUM 40 MG/0.4ML IJ SOSY
40.0000 mg | PREFILLED_SYRINGE | INTRAMUSCULAR | Status: DC
  Administered 2021-06-25: 18:00:00 40 mg via SUBCUTANEOUS
  Filled 2021-06-25: qty 0.4

## 2021-06-25 MED ORDER — HYDROCODONE BIT-HOMATROP MBR 5-1.5 MG/5ML PO SOLN
5.0000 mL | ORAL | Status: DC | PRN
  Administered 2021-06-26: 08:00:00 5 mL via ORAL
  Filled 2021-06-25: qty 5

## 2021-06-25 MED ORDER — IPRATROPIUM-ALBUTEROL 0.5-2.5 (3) MG/3ML IN SOLN
3.0000 mL | Freq: Once | RESPIRATORY_TRACT | Status: AC
  Administered 2021-06-25: 13:00:00 3 mL via RESPIRATORY_TRACT
  Filled 2021-06-25: qty 3

## 2021-06-25 MED ORDER — LEVETIRACETAM ER 500 MG PO TB24
1000.0000 mg | ORAL_TABLET | Freq: Every day | ORAL | Status: DC
  Administered 2021-06-25: 21:00:00 1000 mg via ORAL
  Filled 2021-06-25 (×3): qty 2

## 2021-06-25 MED ORDER — IPRATROPIUM-ALBUTEROL 0.5-2.5 (3) MG/3ML IN SOLN
3.0000 mL | Freq: Four times a day (QID) | RESPIRATORY_TRACT | Status: DC
  Administered 2021-06-25: 20:00:00 3 mL via RESPIRATORY_TRACT
  Filled 2021-06-25: qty 3

## 2021-06-25 MED ORDER — DM-GUAIFENESIN ER 30-600 MG PO TB12
1.0000 | ORAL_TABLET | Freq: Two times a day (BID) | ORAL | Status: DC
  Administered 2021-06-25 – 2021-06-26 (×2): 1 via ORAL
  Filled 2021-06-25 (×2): qty 1

## 2021-06-25 MED ORDER — LISINOPRIL 10 MG PO TABS
20.0000 mg | ORAL_TABLET | Freq: Every day | ORAL | Status: DC
  Administered 2021-06-25 – 2021-06-26 (×3): 20 mg via ORAL
  Filled 2021-06-25 (×2): qty 2

## 2021-06-25 MED ORDER — METHYLPREDNISOLONE SODIUM SUCC 125 MG IJ SOLR
125.0000 mg | Freq: Once | INTRAMUSCULAR | Status: AC
  Administered 2021-06-25: 12:00:00 125 mg via INTRAVENOUS
  Filled 2021-06-25: qty 2

## 2021-06-25 MED ORDER — PHENOL 1.4 % MT LIQD
1.0000 | OROMUCOSAL | Status: DC | PRN
  Filled 2021-06-25: qty 177

## 2021-06-25 MED ORDER — ONDANSETRON HCL 4 MG/2ML IJ SOLN: 4.0000 mg | Freq: Four times a day (QID) | INTRAMUSCULAR | Status: DC | PRN

## 2021-06-25 MED ORDER — ACETAMINOPHEN 650 MG RE SUPP: 650.0000 mg | Freq: Four times a day (QID) | RECTAL | Status: DC | PRN

## 2021-06-25 MED ORDER — ACETAMINOPHEN 325 MG PO TABS: 650.0000 mg | ORAL_TABLET | Freq: Four times a day (QID) | ORAL | Status: DC | PRN

## 2021-06-25 MED ORDER — ALLOPURINOL 100 MG PO TABS
300.0000 mg | ORAL_TABLET | Freq: Every day | ORAL | Status: DC
  Administered 2021-06-25 – 2021-06-26 (×2): 300 mg via ORAL
  Filled 2021-06-25 (×2): qty 3

## 2021-06-25 MED ORDER — ALBUTEROL SULFATE HFA 108 (90 BASE) MCG/ACT IN AERS
INHALATION_SPRAY | RESPIRATORY_TRACT | Status: AC
  Filled 2021-06-25: qty 6.7

## 2021-06-25 MED ORDER — SODIUM CHLORIDE 0.9 % IV BOLUS
1000.0000 mL | Freq: Once | INTRAVENOUS | Status: AC
  Administered 2021-06-25: 10:00:00 1000 mL via INTRAVENOUS

## 2021-06-25 MED ORDER — MAGNESIUM SULFATE 2 GM/50ML IV SOLN
2.0000 g | Freq: Once | INTRAVENOUS | Status: AC
  Administered 2021-06-25: 13:00:00 2 g via INTRAVENOUS
  Filled 2021-06-25: qty 50

## 2021-06-25 MED ORDER — MENTHOL 3 MG MT LOZG
1.0000 | LOZENGE | OROMUCOSAL | Status: DC | PRN
  Administered 2021-06-25: 18:00:00 3 mg via ORAL
  Filled 2021-06-25: qty 9

## 2021-06-25 MED ORDER — URSODIOL 300 MG PO CAPS
600.0000 mg | ORAL_CAPSULE | Freq: Two times a day (BID) | ORAL | Status: DC
  Administered 2021-06-25 – 2021-06-26 (×2): 600 mg via ORAL
  Filled 2021-06-25 (×6): qty 2

## 2021-06-25 MED ORDER — BUDESONIDE 0.5 MG/2ML IN SUSP
2.0000 mg | Freq: Two times a day (BID) | RESPIRATORY_TRACT | Status: DC
  Administered 2021-06-25 – 2021-06-26 (×2): 2 mg via RESPIRATORY_TRACT
  Filled 2021-06-25 (×2): qty 8

## 2021-06-25 MED ORDER — AZITHROMYCIN 250 MG PO TABS
500.0000 mg | ORAL_TABLET | Freq: Every day | ORAL | Status: DC
  Filled 2021-06-25: qty 2

## 2021-06-25 MED ORDER — SODIUM CHLORIDE 0.9 % IV SOLN: INTRAVENOUS | Status: DC

## 2021-06-25 MED ORDER — ONDANSETRON HCL 4 MG PO TABS: 4.0000 mg | ORAL_TABLET | Freq: Four times a day (QID) | ORAL | Status: DC | PRN

## 2021-06-25 MED ORDER — ALBUTEROL SULFATE (2.5 MG/3ML) 0.083% IN NEBU: 2.5000 mg | INHALATION_SOLUTION | RESPIRATORY_TRACT | Status: DC | PRN

## 2021-06-25 MED ORDER — ALBUTEROL SULFATE (2.5 MG/3ML) 0.083% IN NEBU
2.5000 mg | INHALATION_SOLUTION | Freq: Once | RESPIRATORY_TRACT | Status: AC
  Administered 2021-06-25: 13:00:00 2.5 mg via RESPIRATORY_TRACT
  Filled 2021-06-25: qty 3

## 2021-06-25 MED ORDER — IOHEXOL 350 MG/ML SOLN
75.0000 mL | Freq: Once | INTRAVENOUS | Status: AC | PRN
  Administered 2021-06-25: 11:00:00 75 mL via INTRAVENOUS

## 2021-06-25 MED ORDER — IPRATROPIUM-ALBUTEROL 0.5-2.5 (3) MG/3ML IN SOLN
3.0000 mL | Freq: Three times a day (TID) | RESPIRATORY_TRACT | Status: DC
  Administered 2021-06-26: 09:00:00 3 mL via RESPIRATORY_TRACT
  Filled 2021-06-25 (×2): qty 3

## 2021-06-25 MED ORDER — PREDNISONE 20 MG PO TABS
40.0000 mg | ORAL_TABLET | Freq: Every day | ORAL | Status: DC
  Administered 2021-06-26: 08:00:00 40 mg via ORAL
  Filled 2021-06-25: qty 2

## 2021-06-25 MED ORDER — HYDROCOD POLST-CPM POLST ER 10-8 MG/5ML PO SUER
5.0000 mL | Freq: Once | ORAL | Status: AC
  Administered 2021-06-25: 12:00:00 5 mL via ORAL
  Filled 2021-06-25: qty 5

## 2021-06-25 MED ORDER — SODIUM CHLORIDE 0.9 % IV SOLN
500.0000 mg | INTRAVENOUS | Status: AC
  Administered 2021-06-25: 18:00:00 500 mg via INTRAVENOUS
  Filled 2021-06-25: qty 5

## 2021-06-25 MED ORDER — IOHEXOL 300 MG/ML  SOLN: 75.0000 mL | Freq: Once | INTRAMUSCULAR | Status: DC | PRN

## 2021-06-25 MED ORDER — URSODIOL 300 MG PO CAPS
ORAL_CAPSULE | ORAL | Status: AC
  Filled 2021-06-25: qty 2

## 2021-06-25 MED ORDER — URSODIOL 500 MG PO TABS: 500.0000 mg | ORAL_TABLET | Freq: Two times a day (BID) | ORAL | Status: DC

## 2021-06-25 NOTE — ED Provider Notes (Signed)
[facility] EMERGENCY DEPARTMENT Provider Note   CSN: [id number] Arrival date & time: 06/25/21  8185     History Chief Complaint  Patient presents with   Cough    Jason Mcmahon is a 55 y.o. male.  HPI  Patient with history of hypertension, seizure disorder on Keppra, gout, PVC presents due to flulike symptoms.  This started acutely 3 days ago, has been having fevers at home as well as cough.  It to dry cough, not productive.  Denies feeling short of breath or having chest pain, does feel nauseated but no vomiting or abdominal pain.  Denies smoking cigarettes, no history of pulmonary disease such as asthma or COPD.  He has tried Tylenol and DayQuil at home which have not alleviated the cough.  He is not typically on oxygen, satting at 89% on room air during triage vitals.  Past Medical History:  Diagnosis Date   Gout    Hypertension    Primary biliary cholangitis ([place])    Seizures ([place])    started on Keppra in April 2022    Patient Active Problem List   Diagnosis Date Noted   Heartburn 01/04/2021   GERD (gastroesophageal reflux disease) 07/07/2020   History of colonic polyps 12/30/2019   Primary biliary cholangitis ([place]) 06/30/2019   Acute cholecystitis 04/05/2019   Abdominal pain 04/05/2019   Abnormal LFTs 04/05/2019   Essential hypertension 04/05/2019   Cholecystitis 04/05/2019   UTI (lower urinary tract infection) 06/25/2012   Sepsis ([place]) 06/25/2012   Tachycardia 06/25/2012    Past Surgical History:  Procedure Laterality Date   arm surgery     COLONOSCOPY N/A 02/28/2016   Surgeon: Aviva Signs, MD; 2 mm tubular adenoma in the ascending colon, 8 mm tubular adenoma in the sigmoid colon, otherwise normal exam.  Repeat in 3 years.   COLONOSCOPY N/A 02/05/2020   Surgeon: Daneil Dolin, MD;  Diverticulosis in the sigmoid and descending colon, otherwise normal exam, repeat in 5 years.   ESOPHAGOGASTRODUODENOSCOPY N/A 02/22/2021   Procedure:  ESOPHAGOGASTRODUODENOSCOPY (EGD);  Surgeon: Daneil Dolin, MD;  Location: [facility] ENDO SUITE;  Service: Endoscopy;  Laterality: N/A;  7:30am       Family History  Problem Relation Age of Onset   Liver disease Father        Alcoholic   Hypertension Father    Cancer Father    Lung cancer Father    Diabetes Paternal Grandmother    Seizures Paternal Aunt    Colon cancer Neg Hx     Social History   Tobacco Use   Smoking status: Former    Packs/day: 3.00    Years: 18.00    Pack years: 54.00    Types: Cigarettes   Smokeless tobacco: Never  Vaping Use   Vaping Use: Never used  Substance Use Topics   Alcohol use: No   Drug use: No    Home Medications Prior to Admission medications   Medication Sig Start Date End Date Taking? Authorizing Provider  allopurinol (ZYLOPRIM) 300 MG tablet Take 300 mg by mouth daily. 09/07/19   [provider]  famotidine (PEPCID) 10 MG tablet Take 10 mg by mouth as needed for heartburn or indigestion.    [provider]  levETIRAcetam (KEPPRA XR) 500 MG 24 hr tablet Take 2 tablets (1,000 mg total) by mouth at bedtime. 11/01/20   Star Age, MD  lisinopril (ZESTRIL) 20 MG tablet Take 20 mg by mouth daily.    [provider]  sildenafil (VIAGRA) 25 MG tablet Take 25-100 mg by mouth daily as needed for erectile dysfunction. 01/25/21   [provider]  ursodiol (ACTIGALL) 500 MG tablet TAKE 1 TABLET(500 MG) BY MOUTH TWICE DAILY 04/26/21   Erenest Rasher, PA-C    Allergies    Aspirin  Review of Systems   Review of Systems  Constitutional:  Positive for fever.  HENT:  Positive for congestion. Negative for sore throat.   Respiratory:  Positive for cough. Negative for shortness of breath.   Cardiovascular:  Negative for chest pain and leg swelling.  Gastrointestinal:  Positive for nausea. Negative for abdominal pain and vomiting.  Musculoskeletal:  Positive for myalgias.   Physical Exam Updated Vital Signs BP (!)  174/96    Pulse (!) 105    Temp 99.8 F (37.7 C) (Oral)    Resp 20    Ht 5\' 8"  (1.727 m)    Wt 85.7 kg    SpO2 90%    BMI 28.74 kg/m   Physical Exam Vitals and nursing note reviewed. Exam conducted with a chaperone present.  Constitutional:      Appearance: Normal appearance.  HENT:     Head: Normocephalic and atraumatic.  Eyes:     General: No scleral icterus.       Right eye: No discharge.        Left eye: No discharge.     Extraocular Movements: Extraocular movements intact.     Pupils: Pupils are equal, round, and reactive to light.  Cardiovascular:     Rate and Rhythm: Regular rhythm. Tachycardia present.     Pulses: Normal pulses.     Heart sounds: Normal heart sounds. No murmur heard.   No friction rub. No gallop.  Pulmonary:     Effort: Pulmonary effort is normal. No respiratory distress.     Breath sounds: Normal breath sounds.     Comments: Lung sounds somewhat diminished but clear to auscultation bilaterally Abdominal:     General: Abdomen is flat. Bowel sounds are normal. There is no distension.     Palpations: Abdomen is soft.     Tenderness: There is no abdominal tenderness.  Skin:    General: Skin is warm and dry.     Coloration: Skin is not jaundiced.  Neurological:     Mental Status: He is alert. Mental status is at baseline.     Coordination: Coordination normal.   ED Results / Procedures / Treatments   Labs (all labs ordered are listed, but only abnormal results are displayed) Labs Reviewed  RESP PANEL BY RT-PCR (FLU A&B, COVID) [id number]  BASIC METABOLIC PANEL  CBC WITH DIFFERENTIAL/PLATELET    EKG None  Radiology No results found.  Procedures Procedures   Medications Ordered in ED Medications  albuterol (VENTOLIN HFA) 108 (90 Base) MCG/ACT inhaler (has no administration in time range)  sodium chloride 0.9 % bolus 1,000 mL (has no administration in time range)    ED Course  I have reviewed the triage vital signs and the nursing  notes.  Pertinent labs & imaging results that were available during my care of the patient were reviewed by me and considered in my medical decision making (see chart for details).    MDM Rules/Calculators/A&P                         Patient is tachycardic with new hypoxia, satting okay on 2 L nasal cannula.  No tachypnea, lungs are  clear to auscultation although somewhat diminished.  Pulses are equal bilaterally, patient denies any chest pain.  We will start with chest x-ray and labs, also evaluate for COVID and flu.  Patient is not having chest pain but PE is still a consideration.  On chart review patient has a documented history of tachycardia often in the 109's at office visits.  Do not see a clear etiology for the persistent tachycardia, additionally his heart rate currently is varying between 109-135 bpm. This seems far above baseline.   Chest x-ray with bibasilar scarring but no evidence of pneumonia, widened mediastinum concerning for dissection. He is also flu and COVID-negative.  We will proceed with CTA given persistent tachycardia and new hypoxia.  Heart rate has improved, no longer tachycardic although he still elevated in the 90s.  CTA is without any evidence of PE.  There is emphysema and cardiomegaly noted.  Additionally hepatic steatosis, doubt hepatic steatosis contributory.  Patient reports he previously smoked cigarettes on reevaluation, states he has not smoked for "a long while".  Will give patient nebulizer, steroid and magnesium and reassess.  If he is able to ambulate without becoming hypoxic after treatment he could potentially be seen outpatient.  Per RN:  I walked him to the bathroom and around the nurse's station. he stayed around 92-93% while ambulating. once we got back to his room I left him on room air and he desat to 88%. put him back on 2L Jason Mcmahon and he's back up to 97%  Will consult with Dr. Roderic Palau the hospitalist for admission.     Final Clinical  Impression(s) / ED Diagnoses Final diagnoses:  None    Rx / DC Orders ED Discharge Orders     None        Sherrill Raring, PA-C 06/25/21 [place], MD 06/26/21 [phone]

## 2021-06-25 NOTE — ED Notes (Signed)
Ambulated pt to the bathroom and around the nurse's station. Pt O2 sat maintained 92-93% while ambulating on room air. Returned to room, left pt on room air and pt desat to 88%. Placed pt back on 2L [place] and pt O2 sat returned to 97%

## 2021-06-25 NOTE — ED Triage Notes (Signed)
Patient brought in via POV from home. Patient states he has had dry cough since Thursday with intermittent fever. States he is congested and cannot sleep.

## 2021-06-25 NOTE — Progress Notes (Signed)
Pt arrived to room #307 via [place] from ED. Pt ambulatory without assist from chair to bed. O2 @ 2lpm [place] on, pt with dry, hacking cough. Oriented to room and safety procedures, states understanding. Call bell within reach, wife at bedside.

## 2021-06-25 NOTE — H&P (Signed)
History and Physical    Jason Mcmahon QMG:[id number] DOB: 1965/09/03 DOA: 06/25/2021  PCP: Cory Munch, PA-C  Patient coming from: home  I have personally briefly reviewed patient's old medical records in [place]  Chief Complaint: cough  HPI: Jason Mcmahon is a 55 y.o. male with medical history significant of hypertension, gout, seizures, previous history of tobacco use, presents to the hospital with persistent cough.  Reports he has felt feverish for the past 3 to 4 days.  He has had a nonproductive cough.  He has not really had any shortness of breath or wheezing.  Cough is significant to the point that he has been unable to sleep for the past 3 days.  He is sore in his ribs from coughing.  Has not had any vomiting or diarrhea.  He previously smoked for about 20 years, but also quit 25 years ago.  He does not have any chronic breathing issues and does not use any inhalers.  ED Course: He was evaluated in emergency room where he was initially noted to be tachycardic.  This improved with IV fluids.  CT angiogram of the chest was negative for pulmonary embolism.  It did indicate possible emphysema.  EKG was unremarkable.  He was noted to be mildly hypoxic.  He received nebulizer treatments and steroids.  Still have mild hypoxia and was referred for admission.  Review of Systems: As per HPI otherwise 10 point review of systems negative.    Past Medical History:  Diagnosis Date   Gout    Hypertension    Primary biliary cholangitis ([place])    Seizures ([place])    started on Keppra in April 2022    Past Surgical History:  Procedure Laterality Date   arm surgery     COLONOSCOPY N/A 02/28/2016   Surgeon: Aviva Signs, MD; 2 mm tubular adenoma in the ascending colon, 8 mm tubular adenoma in the sigmoid colon, otherwise normal exam.  Repeat in 3 years.   COLONOSCOPY N/A 02/05/2020   Surgeon: Daneil Dolin, MD;  Diverticulosis in the sigmoid and descending colon, otherwise  normal exam, repeat in 5 years.   ESOPHAGOGASTRODUODENOSCOPY N/A 02/22/2021   Procedure: ESOPHAGOGASTRODUODENOSCOPY (EGD);  Surgeon: Daneil Dolin, MD;  Location: [facility]O [facility];  Service: Endoscopy;  Laterality: N/A;  7:30am    Social History:  reports that he has quit smoking. His smoking use included cigarettes. He has a 54.00 pack-year smoking history. He has never used smokeless tobacco. He reports that he does not drink alcohol and does not use drugs.  Allergies  Allergen Reactions   Aspirin Anaphylaxis    Family History  Problem Relation Age of Onset   Liver disease Father        Alcoholic   Hypertension Father    Cancer Father    Lung cancer Father    Diabetes Paternal Grandmother    Seizures Paternal Aunt    Colon cancer Neg Hx      Prior to Admission medications   Medication Sig Start Date End Date Taking? Authorizing Provider  allopurinol (ZYLOPRIM) 300 MG tablet Take 300 mg by mouth daily. 09/07/19  Yes [provider]  famotidine (PEPCID) 10 MG tablet Take 10 mg by mouth as needed for heartburn or indigestion.   Yes [provider]  levETIRAcetam (KEPPRA XR) 500 MG 24 hr tablet Take 2 tablets (1,000 mg total) by mouth at bedtime. 11/01/20  Yes Star Age, MD  lisinopril (ZESTRIL) 20 MG tablet  Take 20 mg by mouth daily.   Yes [provider]  sildenafil (VIAGRA) 25 MG tablet Take 25-100 mg by mouth daily as needed for erectile dysfunction. 01/25/21  Yes [provider]  ursodiol (ACTIGALL) 500 MG tablet TAKE 1 TABLET(500 MG) BY MOUTH TWICE DAILY 04/26/21  Yes Erenest Rasher, [place]    Physical Exam: Vitals:   06/25/21 1437 06/25/21 1440 06/25/21 1453 06/25/21 1500  BP:    (!) 155/92  Pulse: 87  92 87  Resp: 13  15 16   Temp:  98.6 F (37 C)  98.1 F (36.7 C)  TempSrc:  Oral  Oral  SpO2: 93%  93% 97%  Weight:      Height:        Constitutional: NAD, calm, comfortable Eyes: PERRL, lids and conjunctivae normal ENMT: Mucous  membranes are moist. Posterior pharynx clear of any exudate or lesions.Normal dentition.  Neck: normal, supple, no masses, no thyromegaly Respiratory: clear to auscultation bilaterally, no wheezing, no crackles. Normal respiratory effort. No accessory muscle use.  Cardiovascular: Regular rate and rhythm, no murmurs / rubs / gallops. No extremity edema. 2+ pedal pulses. No carotid bruits.  Abdomen: no tenderness, no masses palpated. No hepatosplenomegaly. Bowel sounds positive.  Musculoskeletal: no clubbing / cyanosis. No joint deformity upper and lower extremities. Good ROM, no contractures. Normal muscle tone.  Skin: no rashes, lesions, ulcers. No induration Neurologic: CN 2-12 grossly intact. Sensation intact, DTR normal. Strength 5/5 in all 4.  Psychiatric: Normal judgment and insight. Alert and oriented x 3. Normal mood.    Labs on Admission: I have personally reviewed following labs and imaging studies  CBC: Recent Labs  Lab 06/25/21 0925  WBC 8.2  NEUTROABS 5.4  HGB 15.1  HCT 41.8  MCV 97.4  PLT 637   Basic Metabolic Panel: Recent Labs  Lab 06/25/21 0925  NA 135  K 3.6  CL 103  CO2 20*  GLUCOSE 99  BUN 12  CREATININE 1.07  CALCIUM 9.2   GFR: Estimated Creatinine Clearance: 82.1 mL/min (by C-G formula based on SCr of 1.07 mg/dL). Liver Function Tests: No results for input(s): AST, ALT, ALKPHOS, BILITOT, PROT, ALBUMIN in the last 168 hours. No results for input(s): LIPASE, AMYLASE in the last 168 hours. No results for input(s): AMMONIA in the last 168 hours. Coagulation Profile: No results for input(s): INR, PROTIME in the last 168 hours. Cardiac Enzymes: No results for input(s): CKTOTAL, CKMB, CKMBINDEX, TROPONINI in the last 168 hours. BNP (last 3 results) No results for input(s): PROBNP in the last 8760 hours. HbA1C: No results for input(s): HGBA1C in the last 72 hours. CBG: No results for input(s): GLUCAP in the last 168 hours. Lipid Profile: No results  for input(s): CHOL, HDL, LDLCALC, TRIG, CHOLHDL, LDLDIRECT in the last 72 hours. Thyroid Function Tests: No results for input(s): TSH, T4TOTAL, FREET4, T3FREE, THYROIDAB in the last 72 hours. Anemia Panel: No results for input(s): VITAMINB12, FOLATE, FERRITIN, TIBC, IRON, RETICCTPCT in the last 72 hours. Urine analysis:    Component Value Date/Time   COLORURINE YELLOW 04/05/2019 0120   APPEARANCEUR CLEAR 04/05/2019 0120   LABSPEC 1.006 04/05/2019 0120   PHURINE 6.0 04/05/2019 0120   GLUCOSEU NEGATIVE 04/05/2019 0120   HGBUR NEGATIVE 04/05/2019 0120   BILIRUBINUR NEGATIVE 04/05/2019 0120   KETONESUR NEGATIVE 04/05/2019 0120   PROTEINUR NEGATIVE 04/05/2019 0120   UROBILINOGEN 1.0 06/24/2012 2245   NITRITE NEGATIVE 04/05/2019 0120   LEUKOCYTESUR NEGATIVE 04/05/2019 0120    Radiological Exams  on Admission: DG Chest 2 View  Result Date: 06/25/2021 CLINICAL DATA:  Shortness of breath EXAM: CHEST - 2 VIEW COMPARISON:  04/14/2012 FINDINGS: Linear scarring in the lung bases, similar to prior study. No acute confluent opacities or effusions. Heart is normal size. No acute bony abnormality. IMPRESSION: Bibasilar scarring.  No active disease. Electronically Signed   By: Rolm Baptise M.D.   On: 06/25/2021 09:38   CT Angio Chest PE W/Cm &/Or Wo Cm  Result Date: 06/25/2021 CLINICAL DATA:  Dry cough and intermittent fever. Evaluate for pulmonary embolism. EXAM: CT ANGIOGRAPHY CHEST WITH CONTRAST TECHNIQUE: Multidetector CT imaging of the chest was performed using the standard protocol during bolus administration of intravenous contrast. Multiplanar CT image reconstructions and MIPs were obtained to evaluate the vascular anatomy. CONTRAST:  73mL OMNIPAQUE IOHEXOL 350 MG/ML SOLN COMPARISON:  Chest radiograph-earlier same day FINDINGS: Vascular Findings: There is adequate opacification of the pulmonary arterial system with the main pulmonary artery measuring 370 Hounsfield units. There are no discrete  filling defects within the pulmonary arterial tree to suggest pulmonary embolism. Normal caliber of the main pulmonary artery. Cardiomegaly. Coronary artery calcifications. No pericardial effusion. No evidence of thoracic aortic aneurysm or dissection on this nongated examination. Scattered atherosclerotic plaque within thoracic aorta, not resulting in a hemodynamically significant stenosis. Note is made of a aortic nipple. Bovine configuration of the aortic arch. The branch vessels of the aortic arch appear tortuous and widely patent without a hemodynamically significant narrowing. Review of the MIP images confirms the above findings. ---------------------------------------------------------------------------------- Nonvascular Findings: Mediastinum/Lymph Nodes: Scattered mediastinal lymph nodes are numerous though individually not enlarged by size criteria with index pre carinal lymph node measuring 0.7 cm in greatest short axis diameter (image 31, series 4). No bulky mediastinal, hilar or axillary lymphadenopathy. Lungs/Pleura: Evaluation the pulmonary parenchyma is degraded secondary to patient motion artifact. Apical predominant centrilobular emphysematous change. Minimal bibasilar dependent subpleural ground-glass atelectasis. No discrete focal airspace opacities. No pleural effusion or pneumothorax. No discrete pulmonary nodules given limitation of the examination. Upper abdomen: Limited early arterial phase evaluation of the upper abdomen demonstrates mild diffuse decreased attenuation hepatic parenchyma suggestive of hepatic steatosis. Borderline splenomegaly with the spleen measuring 14 cm in length. Suspected small hiatal hernia. Musculoskeletal: No acute or aggressive osseous abnormalities. Regional soft tissues appear normal. The thyroid gland appears atrophic but without discrete nodule. IMPRESSION: 1. No acute cardiopulmonary disease. Specifically, no evidence of pulmonary embolism. 2. Cardiomegaly.  3. Coronary artery calcifications. Aortic Atherosclerosis (ICD10-I70.0). 4.  Emphysema (ICD10-J43.9). 5. Suspected hepatic steatosis.  Correlation with LFTs is advised. Electronically Signed   By: Sandi Mariscal M.D.   On: 06/25/2021 11:25    EKG: Independently reviewed. Sinus rhythm without acute ischemic changes  Assessment/Plan Active Problems:   Essential hypertension   GERD (gastroesophageal reflux disease)   COPD with acute bronchitis (HCC)   Seizure disorder (HCC)   Hypoxia     Acute bronchitis with possible underlying COPD -He has a viral screen for influenza and COVID was negative -He does not have any clear pneumonia on imaging -We will treat with bronchodilators and steroids -Continue antitussives -Would likely benefit from referral for PFTs as an outpatient once respiratory status has stabilized  Hypoxia -Noted to have oxygen saturations in the high 80s -Clinically, the patient did not describe any significant shortness of breath during these episodes -We will continue to monitor his respiratory status and discontinue oxygen as able  Hypertension -Continue on lisinopril  Seizure disorder -Continue Keppra  Gout -  Continue on allopurinol  GERD -We will start on PPI.  DVT prophylaxis: lovenox  Code Status: full code  Family Communication: discussed with family at the bedside  Disposition Plan: discharge home once improved  Consults called:   Admission status: observation, medsurg   Kathie Dike MD [facility]   If 7PM-7AM, please contact night-coverage [email or web address]   06/25/2021, 4:24 PM

## 2021-06-26 DIAGNOSIS — J440 Chronic obstructive pulmonary disease with acute lower respiratory infection: Secondary | ICD-10-CM | POA: Diagnosis not present

## 2021-06-26 DIAGNOSIS — J439 Emphysema, unspecified: Secondary | ICD-10-CM | POA: Diagnosis not present

## 2021-06-26 DIAGNOSIS — I10 Essential (primary) hypertension: Secondary | ICD-10-CM | POA: Diagnosis not present

## 2021-06-26 DIAGNOSIS — G40909 Epilepsy, unspecified, not intractable, without status epilepticus: Secondary | ICD-10-CM | POA: Diagnosis not present

## 2021-06-26 LAB — HIV ANTIBODY (ROUTINE TESTING W REFLEX): HIV Screen 4th Generation wRfx: NONREACTIVE

## 2021-06-26 MED ORDER — ALBUTEROL SULFATE HFA 108 (90 BASE) MCG/ACT IN AERS: 2.0000 | INHALATION_SPRAY | Freq: Four times a day (QID) | RESPIRATORY_TRACT | 0 refills | Status: DC | PRN

## 2021-06-26 MED ORDER — DM-GUAIFENESIN ER 30-600 MG PO TB12
1.0000 | ORAL_TABLET | Freq: Two times a day (BID) | ORAL | 0 refills | Status: DC
Start: 2021-06-26 — End: 2021-06-29

## 2021-06-26 MED ORDER — AZITHROMYCIN 250 MG PO TABS
250.0000 mg | ORAL_TABLET | Freq: Every day | ORAL | 0 refills | Status: DC
Start: 2021-06-26 — End: 2021-07-19

## 2021-06-26 MED ORDER — HYDROCODONE BIT-HOMATROP MBR 5-1.5 MG/5ML PO SOLN
5.0000 mL | Freq: Four times a day (QID) | ORAL | 0 refills | Status: DC | PRN
Start: 2021-06-26 — End: 2021-07-19

## 2021-06-26 MED ORDER — PREDNISONE 10 MG PO TABS: ORAL_TABLET | ORAL | 0 refills | Status: DC

## 2021-06-26 NOTE — Discharge Summary (Signed)
Physician Discharge Summary  Jason Mcmahon VHQ:[id number] DOB: 08/13/1965 DOA: 06/25/2021  PCP: Cory Munch, PA-C  Admit date: 06/25/2021 Discharge date: 06/26/2021  Admitted From: Home Disposition: Home  Recommendations for Outpatient Follow-up:  Follow up with PCP in 1-2 weeks Please obtain BMP/CBC in one week Outpatient referral to pulmonology has been made for PFTs and further evaluation of possible COPD  Home Health: Equipment/Devices:  Discharge Condition:  Stable CODE STATUS: Full code Diet: Heart healthy  Brief/Interim Summary: 55 year old male with history of hypertension, gout, seizures, previous tobacco use, presented to the hospital with complaints of persistent cough.  He was noted to be feverish for 3 to 4 days prior to admission.  He had a nonproductive cough.  He was evaluated in the emergency room where he was noted to be tachycardic.  This improved with IV fluids.  CT angiogram of the chest was negative for pulmonary embolism.  It did comment on possible emphysema.  Patient received nebulizer treatments and steroids.  He also received cough medicine.  Overall his respiratory status improved.  Overall cough is better.  He was noted to be hypoxic in the emergency room.  The next day, he was evaluated on room air and did not have any significant hypoxia on ambulation.  He will be transitioned to prednisone taper, course of azithromycin and will receive bronchodilators.  He will be referred to pulmonology for follow-up for PFTs since there was mention of possible COPD.  He is otherwise stable for discharge.  Discharge Diagnoses:  Active Problems:   Essential hypertension   GERD (gastroesophageal reflux disease)   COPD with acute bronchitis ([place])   Seizure disorder [facility])   Hypoxia    Discharge Instructions  Discharge Instructions     Ambulatory referral to Pulmonology   Complete by: As directed    Reason for referral: Asthma/COPD   Diet - low sodium heart  healthy   Complete by: As directed    Increase activity slowly   Complete by: As directed       Allergies as of 06/26/2021       Reactions   Aspirin Anaphylaxis        Medication List     TAKE these medications    albuterol 108 (90 Base) MCG/ACT inhaler Commonly known as: VENTOLIN HFA Inhale 2 puffs into the lungs every 6 (six) hours as needed for wheezing or shortness of breath.   allopurinol 300 MG tablet Commonly known as: ZYLOPRIM Take 300 mg by mouth daily.   azithromycin 250 MG tablet Commonly known as: ZITHROMAX Take 1 tablet (250 mg total) by mouth daily.   dextromethorphan-guaiFENesin 30-600 MG 12hr tablet Commonly known as: MUCINEX DM Take 1 tablet by mouth 2 (two) times daily.   famotidine 10 MG tablet Commonly known as: PEPCID Take 10 mg by mouth as needed for heartburn or indigestion.   HYDROcodone bit-homatropine 5-1.5 MG/5ML syrup Commonly known as: HYCODAN Take 5 mLs by mouth every 6 (six) hours as needed for cough.   levETIRAcetam 500 MG 24 hr tablet Commonly known as: KEPPRA XR Take 2 tablets (1,000 mg total) by mouth at bedtime.   lisinopril 20 MG tablet Commonly known as: ZESTRIL Take 20 mg by mouth daily.   predniSONE 10 MG tablet Commonly known as: DELTASONE Take 40mg  po daily for 2 days then 30mg  daily for 2 days then 20mg  daily for 2 days then 10mg  daily for 2 days then stop   sildenafil 25 MG tablet Commonly known as:  VIAGRA Take 25-100 mg by mouth daily as needed for erectile dysfunction.   ursodiol 500 MG tablet Commonly known as: ACTIGALL TAKE 1 TABLET(500 MG) BY MOUTH TWICE DAILY        Follow-up Information     Cory Munch, PA-C. Schedule an appointment as soon as possible for a visit in 2 week(s).   Specialties: Physician Assistant, Internal Medicine Contact information: [place] [address] [phone]                Allergies  Allergen Reactions   Aspirin  Anaphylaxis    Consultations:    Procedures/Studies: DG Chest 2 View  Result Date: 06/25/2021 CLINICAL DATA:  Shortness of breath EXAM: CHEST - 2 VIEW COMPARISON:  04/14/2012 FINDINGS: Linear scarring in the lung bases, similar to prior study. No acute confluent opacities or effusions. Heart is normal size. No acute bony abnormality. IMPRESSION: Bibasilar scarring.  No active disease. Electronically Signed   By: Rolm Baptise M.D.   On: 06/25/2021 09:38   CT Angio Chest PE W/Cm &/Or Wo Cm  Result Date: 06/25/2021 CLINICAL DATA:  Dry cough and intermittent fever. Evaluate for pulmonary embolism. EXAM: CT ANGIOGRAPHY CHEST WITH CONTRAST TECHNIQUE: Multidetector CT imaging of the chest was performed using the standard protocol during bolus administration of intravenous contrast. Multiplanar CT image reconstructions and MIPs were obtained to evaluate the vascular anatomy. CONTRAST:  16mL OMNIPAQUE IOHEXOL 350 MG/ML SOLN COMPARISON:  Chest radiograph-earlier same day FINDINGS: Vascular Findings: There is adequate opacification of the pulmonary arterial system with the main pulmonary artery measuring 370 Hounsfield units. There are no discrete filling defects within the pulmonary arterial tree to suggest pulmonary embolism. Normal caliber of the main pulmonary artery. Cardiomegaly. Coronary artery calcifications. No pericardial effusion. No evidence of thoracic aortic aneurysm or dissection on this nongated examination. Scattered atherosclerotic plaque within thoracic aorta, not resulting in a hemodynamically significant stenosis. Note is made of a aortic nipple. Bovine configuration of the aortic arch. The branch vessels of the aortic arch appear tortuous and widely patent without a hemodynamically significant narrowing. Review of the MIP images confirms the above findings. ---------------------------------------------------------------------------------- Nonvascular Findings: Mediastinum/Lymph Nodes:  Scattered mediastinal lymph nodes are numerous though individually not enlarged by size criteria with index pre carinal lymph node measuring 0.7 cm in greatest short axis diameter (image 31, series 4). No bulky mediastinal, hilar or axillary lymphadenopathy. Lungs/Pleura: Evaluation the pulmonary parenchyma is degraded secondary to patient motion artifact. Apical predominant centrilobular emphysematous change. Minimal bibasilar dependent subpleural ground-glass atelectasis. No discrete focal airspace opacities. No pleural effusion or pneumothorax. No discrete pulmonary nodules given limitation of the examination. Upper abdomen: Limited early arterial phase evaluation of the upper abdomen demonstrates mild diffuse decreased attenuation hepatic parenchyma suggestive of hepatic steatosis. Borderline splenomegaly with the spleen measuring 14 cm in length. Suspected small hiatal hernia. Musculoskeletal: No acute or aggressive osseous abnormalities. Regional soft tissues appear normal. The thyroid gland appears atrophic but without discrete nodule. IMPRESSION: 1. No acute cardiopulmonary disease. Specifically, no evidence of pulmonary embolism. 2. Cardiomegaly. 3. Coronary artery calcifications. Aortic Atherosclerosis (ICD10-I70.0). 4.  Emphysema (ICD10-J43.9). 5. Suspected hepatic steatosis.  Correlation with LFTs is advised. Electronically Signed   By: Sandi Mariscal M.D.   On: 06/25/2021 11:25      Subjective: Feeling better.  No shortness of breath.  Discharge Exam: Vitals:   06/25/21 2003 06/25/21 2157 06/26/21 0439 06/26/21 0849  BP:  (!) 159/81 (!) 153/96   Pulse: 78  99 67   Resp: 18 18 18    Temp:  98.6 F (37 C) 98.5 F (36.9 C)   TempSrc:      SpO2: 95% 92% 98% 94%  Weight:      Height:        General: Pt is alert, awake, not in acute distress Cardiovascular: RRR, S1/S2 +, no rubs, no gallops Respiratory: CTA bilaterally, no wheezing, no rhonchi Abdominal: Soft, NT, ND, bowel sounds  + Extremities: no edema, no cyanosis    The results of significant diagnostics from this hospitalization (including imaging, microbiology, ancillary and laboratory) are listed below for reference.     Microbiology: Recent Results (from the past 240 hour(s))  Resp Panel by RT-PCR (Flu A&B, Covid) Nasopharyngeal Swab     Status: None   Collection Time: 06/25/21  8:46 AM   Specimen: Nasopharyngeal Swab; Nasopharyngeal(NP) swabs in vial transport medium  Result Value Ref Range Status   SARS Coronavirus 2 by RT PCR NEGATIVE NEGATIVE Final    Comment: (NOTE) SARS-CoV-2 target nucleic acids are NOT DETECTED.  The SARS-CoV-2 RNA is generally detectable in upper respiratory specimens during the acute phase of infection. The lowest concentration of SARS-CoV-2 viral copies this assay can detect is 138 copies/mL. A negative result does not preclude SARS-Cov-2 infection and should not be used as the sole basis for treatment or other patient management decisions. A negative result may occur with  improper specimen collection/handling, submission of specimen other than nasopharyngeal swab, presence of viral mutation(s) within the areas targeted by this assay, and inadequate number of viral copies(<138 copies/mL). A negative result must be combined with clinical observations, patient history, and epidemiological information. The expected result is Negative.  Fact Sheet for Patients:  EntrepreneurPulse.com.au  Fact Sheet for Healthcare Providers:  IncredibleEmployment.be  This test is no t yet approved or cleared by the [place] FDA and  has been authorized for detection and/or diagnosis of SARS-CoV-2 by FDA under an Emergency Use Authorization (EUA). This EUA will remain  in effect (meaning this test can be used) for the duration of the COVID-19 declaration under Section 564(b)(1) of the Act, 21 U.S.C.section 360bbb-3(b)(1), unless the authorization  is terminated  or revoked sooner.       Influenza A by PCR NEGATIVE NEGATIVE Final   Influenza B by PCR NEGATIVE NEGATIVE Final    Comment: (NOTE) The Xpert Xpress SARS-CoV-2/FLU/RSV plus assay is intended as an aid in the diagnosis of influenza from Nasopharyngeal swab specimens and should not be used as a sole basis for treatment. Nasal washings and aspirates are unacceptable for Xpert Xpress SARS-CoV-2/FLU/RSV testing.  Fact Sheet for Patients: EntrepreneurPulse.com.au  Fact Sheet for Healthcare Providers: IncredibleEmployment.be  This test is not yet approved or cleared by the [place] FDA and has been authorized for detection and/or diagnosis of SARS-CoV-2 by FDA under an Emergency Use Authorization (EUA). This EUA will remain in effect (meaning this test can be used) for the duration of the COVID-19 declaration under Section 564(b)(1) of the Act, 21 U.S.C. section 360bbb-3(b)(1), unless the authorization is terminated or revoked.  Performed at [facility], [address], [place], [place] [address]      Labs: BNP (last 3 results) No results for input(s): BNP in the last 8760 hours. Basic Metabolic Panel: Recent Labs  Lab 06/25/21 0925 06/25/21 1640  NA 135  --   K 3.6  --   CL 103  --   CO2 20*  --   GLUCOSE 99  --  BUN 12  --   CREATININE 1.07 1.20  CALCIUM 9.2  --    Liver Function Tests: No results for input(s): AST, ALT, ALKPHOS, BILITOT, PROT, ALBUMIN in the last 168 hours. No results for input(s): LIPASE, AMYLASE in the last 168 hours. No results for input(s): AMMONIA in the last 168 hours. CBC: Recent Labs  Lab 06/25/21 0925 06/25/21 1640  WBC 8.2 6.5  NEUTROABS 5.4  --   HGB 15.1 14.8  HCT 41.8 42.6  MCV 97.4 97.7  PLT 164 153   Cardiac Enzymes: No results for input(s): CKTOTAL, CKMB, CKMBINDEX, TROPONINI in the last 168 hours. BNP: Invalid input(s): POCBNP CBG: No results for input(s): GLUCAP  in the last 168 hours. D-Dimer No results for input(s): DDIMER in the last 72 hours. Hgb A1c No results for input(s): HGBA1C in the last 72 hours. Lipid Profile No results for input(s): CHOL, HDL, LDLCALC, TRIG, CHOLHDL, LDLDIRECT in the last 72 hours. Thyroid function studies No results for input(s): TSH, T4TOTAL, T3FREE, THYROIDAB in the last 72 hours.  Invalid input(s): FREET3 Anemia work up No results for input(s): VITAMINB12, FOLATE, FERRITIN, TIBC, IRON, RETICCTPCT in the last 72 hours. Urinalysis    Component Value Date/Time   COLORURINE YELLOW 04/05/2019 0120   APPEARANCEUR CLEAR 04/05/2019 0120   LABSPEC 1.006 04/05/2019 0120   PHURINE 6.0 04/05/2019 0120   GLUCOSEU NEGATIVE 04/05/2019 0120   HGBUR NEGATIVE 04/05/2019 0120   BILIRUBINUR NEGATIVE 04/05/2019 0120   KETONESUR NEGATIVE 04/05/2019 0120   PROTEINUR NEGATIVE 04/05/2019 0120   UROBILINOGEN 1.0 06/24/2012 2245   NITRITE NEGATIVE 04/05/2019 0120   LEUKOCYTESUR NEGATIVE 04/05/2019 0120   Sepsis Labs Invalid input(s): PROCALCITONIN,  WBC,  LACTICIDVEN Microbiology Recent Results (from the past 240 hour(s))  Resp Panel by RT-PCR (Flu A&B, Covid) Nasopharyngeal Swab     Status: None   Collection Time: 06/25/21  8:46 AM   Specimen: Nasopharyngeal Swab; Nasopharyngeal(NP) swabs in vial transport medium  Result Value Ref Range Status   SARS Coronavirus 2 by RT PCR NEGATIVE NEGATIVE Final    Comment: (NOTE) SARS-CoV-2 target nucleic acids are NOT DETECTED.  The SARS-CoV-2 RNA is generally detectable in upper respiratory specimens during the acute phase of infection. The lowest concentration of SARS-CoV-2 viral copies this assay can detect is 138 copies/mL. A negative result does not preclude SARS-Cov-2 infection and should not be used as the sole basis for treatment or other patient management decisions. A negative result may occur with  improper specimen collection/handling, submission of specimen other than  nasopharyngeal swab, presence of viral mutation(s) within the areas targeted by this assay, and inadequate number of viral copies(<138 copies/mL). A negative result must be combined with clinical observations, patient history, and epidemiological information. The expected result is Negative.  Fact Sheet for Patients:  EntrepreneurPulse.com.au  Fact Sheet for Healthcare Providers:  IncredibleEmployment.be  This test is no t yet approved or cleared by the [place] FDA and  has been authorized for detection and/or diagnosis of SARS-CoV-2 by FDA under an Emergency Use Authorization (EUA). This EUA will remain  in effect (meaning this test can be used) for the duration of the COVID-19 declaration under Section 564(b)(1) of the Act, 21 U.S.C.section 360bbb-3(b)(1), unless the authorization is terminated  or revoked sooner.       Influenza A by PCR NEGATIVE NEGATIVE Final   Influenza B by PCR NEGATIVE NEGATIVE Final    Comment: (NOTE) The Xpert Xpress SARS-CoV-2/FLU/RSV plus assay is intended as an aid in  the diagnosis of influenza from Nasopharyngeal swab specimens and should not be used as a sole basis for treatment. Nasal washings and aspirates are unacceptable for Xpert Xpress SARS-CoV-2/FLU/RSV testing.  Fact Sheet for Patients: EntrepreneurPulse.com.au  Fact Sheet for Healthcare Providers: IncredibleEmployment.be  This test is not yet approved or cleared by the [place] FDA and has been authorized for detection and/or diagnosis of SARS-CoV-2 by FDA under an Emergency Use Authorization (EUA). This EUA will remain in effect (meaning this test can be used) for the duration of the COVID-19 declaration under Section 564(b)(1) of the Act, 21 U.S.C. section 360bbb-3(b)(1), unless the authorization is terminated or revoked.  Performed at [facility], [address], [place], [place] [address]       Time coordinating discharge: 73mins  SIGNED:   Kathie Dike, MD  [facility] 06/26/2021, 9:22 PM   If 7PM-7AM, please contact night-coverage [email or web address]

## 2021-06-26 NOTE — Progress Notes (Signed)
Ambulated pt in hallway on RA. Sats remained in the high 90's, lowest was 94%. MD aware.

## 2021-06-26 NOTE — Progress Notes (Signed)
°  Transition of Care [facility]) Screening Note   Patient Details  Name: Jason Mcmahon Date of Birth: 07-27-1965   Transition of Care [facility]) CM/SW Contact:    Ihor Gully, LCSW Phone Number: 06/26/2021, 11:06 AM    Transition of Care Department [facility]) has reviewed patient and no TOC needs have been identified at this time. We will continue to monitor patient advancement through interdisciplinary progression rounds. If new patient transition needs arise, please place a TOC consult.  Neely Cecena, Clydene Pugh, LCSW

## 2021-06-29 ENCOUNTER — Encounter: Payer: Self-pay | Admitting: Internal Medicine

## 2021-06-29 ENCOUNTER — Ambulatory Visit (INDEPENDENT_AMBULATORY_CARE_PROVIDER_SITE_OTHER): Payer: 59 | Admitting: Internal Medicine

## 2021-06-29 ENCOUNTER — Other Ambulatory Visit: Payer: Self-pay

## 2021-06-29 DIAGNOSIS — R051 Acute cough: Secondary | ICD-10-CM

## 2021-06-29 DIAGNOSIS — I10 Essential (primary) hypertension: Secondary | ICD-10-CM | POA: Diagnosis not present

## 2021-06-29 MED ORDER — IRBESARTAN 150 MG PO TABS: 150.0000 mg | ORAL_TABLET | Freq: Every day | ORAL | 11 refills | Status: DC

## 2021-06-29 NOTE — Assessment & Plan Note (Addendum)
Onset with viral uri around 06/21/21 > see admit 06/25/21  -  Chest CT 06/25/21 Apical predominant centrilobular emphysematous change.   -  D/c acei/ max rx gerd short term 06/29/2021 >>>   Pattern is most c/w Upper airway cough syndrome (previously labeled PNDS),  is so named because it's frequently impossible to sort out how much is  CR/sinusitis with freq throat clearing (which can be related to primary GERD)   vs  causing  secondary (" extra esophageal")  GERD from wide swings in gastric pressure that occur with throat clearing, often  promoting self use of mint and menthol lozenges that reduce the lower esophageal sphincter tone and exacerbate the problem further in a cyclical fashion.   These are the same pts (now being labeled as having "irritable larynx syndrome" by some cough centers) who not infrequently have a history of having failed to tolerate ace inhibitors,  dry powder inhalers or biphosphonates or report having atypical/extraesophageal reflux symptoms that don't respond to standard doses of PPI  and are easily confused as having aecopd or asthma flares by even experienced allergists/ pulmonologists (myself included).   rec off acei/ on gerd rx while coughing/ f/u prn if not 100%   NB the absence of any doe or tendency to CB or acute bronchitis prior to this episode argues strongly against significant copd based on the Fletcher curve:  When respiratory symptoms begin or become refractory well after a patient reports complete smoking cessation,  Especially when this wasn't the case while they were smoking, a red flag is raised based on the work of Dr Kris Mouton which states:  if you quit smoking when your best day FEV1 is still well preserved it is highly unlikely you will progress to severe disease.  That is to say, once the smoking stops,  the symptoms should not suddenly erupt or markedly worsen.  If so, the differential diagnosis should include  obesity/deconditioning,   LPR/Reflux/Aspiration syndromes,  occult CHF, or  especially side effect of medications commonly used in this population eg ACEi (see sep a/p)

## 2021-06-29 NOTE — Assessment & Plan Note (Signed)
D/c acei 06/29/2021 due to "worst cough ever" xmas 2022   ACEi adverse effects at the  top of the usual list of suspects and the only way to rule it out is a trial off   ACE inhibitors are problematic in  pts with airway complaints because  even experienced pulmonologists can't always distinguish ace effects from copd/asthma.  By themselves they don't actually cause a problem, much like oxygen can't by itself start a fire, but they certainly serve as a powerful catalyst or enhancer for any "fire"  or inflammatory process in the upper airway, be it caused by a URI and or  reflux (especially in the obese or pts with known GERD or who are on biphoshonates).    In the era of ARB near equivalency until we have a better handle on the reversibility of the airway problem, it just makes sense to avoid ACEI  entirely in the short run and then decide later, having established a level of airway control using a reasonable limited regimen, whether to add back ace but even then being very careful to observe the pt for worsening airway control and number of meds used/ needed to control symptoms.    >>> try avapro 150 mg one half daily, self monitor bp and f/u in 6 weeks if not 100% better.          Each maintenance medication was reviewed in detail including emphasizing most importantly the difference between maintenance and prns and under what circumstances the prns are to be triggered using an action plan format where appropriate.  Total time for H and P, chart review, counseling,  and generating customized AVS unique to this office visit / same day charting = 45 min

## 2021-06-29 NOTE — Patient Instructions (Addendum)
Stop lisinopril (zestril) and replace with avapro (ibesartan)  150 mg one half daily  daily in its place   Try prilosec otc 20mg   Take 30-60 min before first meal of the day and Pepcid ac (famotidine) 20 mg one @  bedtime until cough is completely gone for at least a week without the need for cough suppression     GERD (REFLUX)  is an extremely common cause of respiratory symptoms just like yours , many times with no obvious heartburn at all.    It can be treated with medication, but also with lifestyle changes including elevation of the head of your bed (ideally with 6 -8inch blocks under the headboard of your bed),  Smoking cessation, avoidance of late meals, excessive alcohol, and avoid fatty foods, chocolate, peppermint, colas, red wine, and acidic juices such as orange juice.  NO MINT OR MENTHOL PRODUCTS SO NO COUGH DROPS  USE SUGARLESS CANDY INSTEAD (Jolley ranchers or Stover's or Life Savers) or even ice chips will also do - the key is to swallow to prevent all throat clearing. NO OIL BASED VITAMINS - use powdered substitutes.  Avoid fish oil when coughing.   Finish prednisone and zithromax  Best syrup is delsym  2 tsp every 12 hours once you run out of hydrocodone cough syrup   If you are satisfied with your treatment plan,  let your doctor know and he/she can either refill your medications or you can return here when your prescription runs out.     If in any way you are not 100% satisfied by the end of six weeks   please tell us.  If 100% better, tell your friends!  Pulmonary follow up is as needed

## 2021-06-29 NOTE — Progress Notes (Signed)
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  He was noted to be feverish for 3 to 4 days prior to admission.  He had a nonproductive cough.  He was evaluated in the emergency room where he was noted to be tachycardic.  This improved with IV fluids.  CT angiogram of the chest was negative for pulmonary embolism.  It did comment on possible emphysema.  Patient received nebulizer treatments and steroids.  He also received cough medicine.  Overall his respiratory status improved.  Overall cough is better.  He was noted to be hypoxic in the emergency room.  The next day, he was evaluated on room air and did not have any significant hypoxia on ambulation.  He will be transitioned to prednisone taper, course of azithromycin and will receive bronchodilators.  He will be referred to pulmonology for follow-up for PFTs since there was mention of possible COPD.  He is otherwise stable for discharge.   Discharge Diagnoses:  Active Problems:   Essential hypertension   GERD (gastroesophageal reflux disease)   COPD with acute bronchitis ([place])   Seizure disorder ([place])   Hypoxia   History of Present Illness  06/29/2021  Pulmonary/ 1st office eval/ [facility] / [facility]  Chief Complaint  Patient presents with   Consult    Dx with COPD 06/25/2021.   Has persistent dry cough.   Dyspnea:  denies/ steps Cough: assoc with hoarseness / worse with voice use/ dry  Sleep: ok now flat  SABA use: none because not sob One  more day zpak / pred remaining  Still taking hycodan /not taking mucinex dm Has overt hb on prn pepcid 10 mg worse since onset of cough   No obvious day to day or daytime variability or assoc excess/ purulent sputum or mucus plugs or hemoptysis or cp or chest tightness, subjective wheeze or overt sinus   symptoms.   Sleeping better now  without nocturnal  or early am exacerbation  of respiratory  c/o's or need for noct saba. Also denies any obvious fluctuation of symptoms with weather or environmental changes or other aggravating or alleviating factors except as outlined above   No unusual exposure hx or h/o childhood pna/ asthma or knowledge of premature birth.  Current Allergies, Complete Past Medical History, Past Surgical History, Family History, and Social History were reviewed in [employer] record.  ROS  The following are not active complaints unless bolded Hoarseness, sore throat, dysphagia, dental problems, itching, sneezing,  nasal congestion or discharge of excess mucus or purulent secretions, ear ache,   fever, chills, sweats, unintended wt loss or wt gain, classically pleuritic or exertional cp,  orthopnea pnd or arm/hand swelling  or leg swelling, presyncope, palpitations, abdominal wall pain with coughing fits anorexia, nausea, vomiting, diarrhea  or change in bowel habits or change in bladder habits, change in stools or change in urine, dysuria, hematuria,  rash, arthralgias, visual complaints, headache, numbness, weakness or ataxia or problems with walking or  coordination,  change in mood or  memory.           .  Past Medical History:  Diagnosis Date   Gout    Hypertension    Primary biliary cholangitis ([place])    Seizures ([place])    started on Keppra in April 2022    Outpatient Medications Prior to Visit  Medication Sig Dispense Refill   albuterol (VENTOLIN HFA) 108 (90 Base) MCG/ACT inhaler Inhale 2 puffs into the lungs every 6 (six) hours as  needed for wheezing or shortness of breath. 8 g 0   allopurinol (ZYLOPRIM) 300 MG tablet Take 300 mg by mouth daily.     azithromycin (ZITHROMAX) 250 MG tablet Take 1 tablet (250 mg total) by mouth daily. 4 each 0   dextromethorphan-guaiFENesin (MUCINEX DM) 30-600 MG 12hr tablet Take 1 tablet by mouth 2 (two) times daily. 30 tablet 0   famotidine (PEPCID) 10 MG tablet Take 10 mg by mouth as needed for heartburn or indigestion.     HYDROcodone bit-homatropine (HYCODAN) 5-1.5 MG/5ML syrup Take 5 mLs by mouth every 6 (six) hours as needed for cough. 120 mL 0   levETIRAcetam (KEPPRA XR) 500 MG 24 hr tablet Take 2 tablets (1,000 mg total) by mouth at bedtime. 180 tablet 3   lisinopril (ZESTRIL) 20 MG tablet Take 20 mg by mouth daily.     predniSONE (DELTASONE) 10 MG tablet Take 40mg  po daily for 2 days then 30mg  daily for 2 days then 20mg  daily for 2 days then 10mg  daily for 2 days then stop 20 tablet 0   sildenafil (VIAGRA) 25 MG tablet Take 25-100 mg by mouth daily as needed for erectile dysfunction.     ursodiol (ACTIGALL) 500 MG tablet TAKE 1 TABLET(500 MG) BY MOUTH TWICE DAILY 60 tablet 5   No facility-administered medications prior to visit.     Objective:     BP (!) 146/90 (BP Location: Left Arm, Patient Position: Sitting)    Pulse 76    Temp 98.4 F (36.9 C) (Temporal)    Ht 5' 8.5" (1.74 m)    Wt 187 lb 0.6 oz (84.8 kg)    SpO2 97% Comment: ra   BMI 28.03 kg/m   SpO2: 97 % (ra)  Hoarse amb wm nad / freq throat clearing    HEENT : pt wearing mask not removed for exam due to covid -19 concerns.    NECK :  without JVD/Nodes/TM/ nl carotid upstrokes bilaterally   LUNGS: no acc muscle use,  Nl contour chest which is clear to A and P bilaterally without cough on insp or exp maneuvers   CV:  RRR  no s3 or murmur or increase in P2, and no edema   ABD:  soft and nontender with nl inspiratory excursion in the supine position. No bruits or organomegaly appreciated, bowel sounds  nl  MS:  Nl gait/ ext warm without deformities, calf tenderness, cyanosis or clubbing No obvious joint restrictions   SKIN: warm and dry without lesions    NEURO:  alert, approp, nl sensorium with  no motor or cerebellar deficits apparent.      I personally reviewed images and agree with radiology impression as follows:   Chest CT 06/25/21 Apical predominant centrilobular emphysematous change. Minimal bibasilar dependent subpleural ground-glass atelectasis. No discrete focal airspace opacities. No pleural effusion or pneumothorax.    Assessment   Acute cough Onset with viral uri around 06/21/21 > see admit 06/25/21  -  Chest  CT 06/25/21 Apical predominant centrilobular emphysematous change.   -  D/c acei/ max rx gerd short term 06/29/2021 >>>   Pattern is most c/w Upper airway cough syndrome (previously labeled PNDS),  is so named because it's frequently impossible to sort out how much is  CR/sinusitis with freq throat clearing (which can be related to primary GERD)   vs  causing  secondary (" extra esophageal")  GERD from wide swings in gastric pressure that occur with throat clearing, often  promoting self use of mint and menthol lozenges that reduce the lower esophageal sphincter tone and exacerbate the problem further in a cyclical fashion.   These are the same pts (now being labeled as having "irritable larynx syndrome" by some cough centers) who not infrequently have a history of having failed to tolerate ace inhibitors,  dry powder inhalers or biphosphonates or report having atypical/extraesophageal reflux symptoms that don't respond to standard doses of PPI  and are easily confused as having aecopd or asthma flares by even experienced allergists/ pulmonologists (myself included).   rec off acei/ on gerd rx while coughing/ f/u prn if not 100%   NB the absence of any doe or tendency to CB or acute bronchitis prior to this episode argues strongly against significant copd based on  the Fletcher curve:  When respiratory symptoms begin or become refractory well after a patient reports complete smoking cessation,  Especially when this wasn't the case while they were smoking, a red flag is raised based on the work of Dr Kris Mouton which states:  if you quit smoking when your best day FEV1 is still well preserved it is highly unlikely you will progress to severe disease.  That is to say, once the smoking stops,  the symptoms should not suddenly erupt or markedly worsen.  If so, the differential diagnosis should include  obesity/deconditioning,  LPR/Reflux/Aspiration syndromes,  occult CHF, or  especially side effect of medications commonly used in this population eg ACEi (see sep a/p)   Essential hypertension D/c acei 06/29/2021 due to "worst cough ever" xmas 2022   ACEi adverse effects at the  top of the usual list of suspects and the only way to rule it out is a trial off   ACE inhibitors are problematic in  pts with airway complaints because  even experienced pulmonologists can't always distinguish ace effects from copd/asthma.  By themselves they don't actually cause a problem, much like oxygen can't by itself start a fire, but they certainly serve as a powerful catalyst or enhancer for any "fire"  or inflammatory process in the upper airway, be it caused by a URI and or  reflux (especially in the obese or pts with known GERD or who are on biphoshonates).    In the era of ARB near equivalency until we have a better handle on the reversibility of the airway problem, it just makes sense to avoid ACEI  entirely in the short run and then decide later, having established a level of airway control using a reasonable limited regimen, whether to add back ace but even then being very careful to observe the pt for worsening airway control and number of meds used/ needed to control symptoms.    >>> try avapro 150 mg one half daily, self monitor bp and f/u in 6 weeks if not 100% better.           Each maintenance medication was reviewed in detail including emphasizing most importantly the difference between maintenance and prns and  under what circumstances the prns are to be triggered using an action plan format where appropriate.  Total time for H and P, chart review, counseling,  and generating customized AVS unique to this office visit / same day charting = 45 min           Christinia Gully, MD 06/29/2021

## 2021-07-17 NOTE — Progress Notes (Signed)
Referring Provider: Ginger Organ Primary Care Physician:  Cory Munch, PA-C Primary GI Physician: Dr. Gala Romney  Chief Complaint  Patient presents with   PBC   Gastroesophageal Reflux    Certain foods makes worse. Takes OTC prevacid    HPI:   Jason Mcmahon is a 56 y.o. male presenting today for follow-up of PBC.   He has history of PBC with elevated alk phos, positive AMA, and positive ASMA.  ANA negative.  Liver biopsy in December 2020 with findings compatible with PBC though not diagnostic.  Possible overlap autoimmune hepatitis or drug-induced liver injury also considered.  Also with mild fibrosis (stage I-2 of 4) he started on Urso 500 mg twice daily.  RUQ ultrasound in January 2020 with normal-appearing liver, kPa 9.6.  Also with history of adenomatous colon polyps, due for repeat in 2026.  Last seen in our office in July 2022.  Clinically, he was doing well without signs or symptoms of decompensated liver disease.  Admitted to occasional reflux symptoms with spicy foods using Zantac 360 as needed which was working well.  No alarm symptoms.  Report completed hepatitis A and B vaccines.  Queried early cirrhosis in setting of mild thrombocytopenia and mildly elevated K PA.  Plan update labs, ultrasound abdomen, DEXA scan, TSH, evaluate for vitamin D deficiencies, continue ursodiol and Zantac 360.    Labs revealed thrombocytopenia at 129, LFTs and INR within normal limits.  TSH normal.  Vitamin A mildly low at 29, Vitamin D mildly low at 27.  Recommended starting men's One-A-Day vitamin and repeat vitamin A and vitamin D in 3 months. Abdominal ultrasound homogenous increased echogenicity of the liver, splenomegaly. DEXA scan was normal.   Case was reviewed with Dr. Gala Romney who recommended proceeding with an EGD to screen for esophageal varices.   EGD completed 02/22/2021: Single 5 mm erosion straddling GE junction, otherwise normal esophagus, small hiatal hernia, otherwise  normal exam.  Stated clinical progress including labs, imaging findings with GI timing of future endoscopy.  Today:  Doing well overall.  He has been struggling with some reflux symptoms.  States his symptoms were initially only triggered by spicy foods, but started to become more frequent and did not matter what he was eating.  He started taking Prevacid over-the-counter.  When he was taking this daily, his symptoms were well controlled.  He has since backed down to taking as needed and his symptoms are flaring back up.  No nausea, vomiting, abdominal pain, dysphagia, BRBPR, or melena.  He has been working on weight loss by limiting his portion sizes, reducing breads and sodas.  Bowels continue to move well.  Denies peripheral edema, abdominal swelling/distention, jaundice, confusion/mental status changes.  Past Medical History:  Diagnosis Date   Gout    Hypertension    Primary biliary cholangitis ([place])    Seizures ([place])    started on Keppra in April 2022    Past Surgical History:  Procedure Laterality Date   arm surgery     COLONOSCOPY N/A 02/28/2016   Surgeon: Aviva Signs, MD; 2 mm tubular adenoma in the ascending colon, 8 mm tubular adenoma in the sigmoid colon, otherwise normal exam.  Repeat in 3 years.   COLONOSCOPY N/A 02/05/2020   Surgeon: Daneil Dolin, MD;  Diverticulosis in the sigmoid and descending colon, otherwise normal exam, repeat in 5 years.   ESOPHAGOGASTRODUODENOSCOPY N/A 02/22/2021   Procedure: ESOPHAGOGASTRODUODENOSCOPY (EGD);  Surgeon: Daneil Dolin, MD;  [facility]  [facility];  Service: Endoscopy;  Laterality: N/A;  7:30am    Current Outpatient Medications  Medication Sig Dispense Refill   allopurinol (ZYLOPRIM) 300 MG tablet Take 300 mg by mouth daily.     irbesartan (AVAPRO) 150 MG tablet Take 1 tablet (150 mg total) by mouth daily. 30 tablet 11   lansoprazole (PREVACID) 30 MG capsule Take 1 capsule (30 mg total) by mouth daily before breakfast. 30  capsule 5   levETIRAcetam (KEPPRA XR) 500 MG 24 hr tablet Take 2 tablets (1,000 mg total) by mouth at bedtime. 180 tablet 3   sildenafil (VIAGRA) 25 MG tablet Take 25-100 mg by mouth daily as needed for erectile dysfunction.     ursodiol (ACTIGALL) 500 MG tablet TAKE 1 TABLET(500 MG) BY MOUTH TWICE DAILY 60 tablet 5   No current facility-administered medications for this visit.    Allergies as of 07/19/2021 - Review Complete 07/19/2021  Allergen Reaction Noted   Aspirin Anaphylaxis 10/11/2020   Lisinopril Cough 07/19/2021    Family History  Problem Relation Age of Onset   Liver disease Father        Alcoholic   Hypertension Father    Cancer Father    Lung cancer Father    Diabetes Paternal Grandmother    Seizures Paternal Aunt    Colon cancer Neg Hx     Social History   Socioeconomic History   Marital status: Married    Spouse name: Not on file   Number of children: Not on file   Years of education: Not on file   Highest education level: Not on file  Occupational History   Not on file  Tobacco Use   Smoking status: Former    Packs/day: 3.00    Years: 18.00    Pack years: 54.00    Types: Cigarettes   Smokeless tobacco: Never  Vaping Use   Vaping Use: Never used  Substance and Sexual Activity   Alcohol use: No   Drug use: No   Sexual activity: Not on file  Other Topics Concern   Not on file  Social History Narrative   Not on file   Social Determinants of Health   Financial Resource Strain: Not on file  Food Insecurity: Not on file  Transportation Needs: Not on file  Physical Activity: Not on file  Stress: Not on file  Social Connections: Not on file    Review of Systems: Gen: Denies fever, chills, cold or flulike symptoms, presyncope, syncope. CV: Denies chest pain, palpitations. Resp: Denies dyspnea or cough. GI: See HPI Derm: Denies rash or itching Heme: See HPI  Physical Exam: BP (!) 141/90    Pulse 94    Temp (!) 97.2 F (36.2 C)    Ht 5' 9"  (1.753 m)    Wt 186 lb 12.8 oz (84.7 kg)    BMI 27.59 kg/m  General:   Alert and oriented. No distress noted. Pleasant and cooperative.  Head:  Normocephalic and atraumatic. Eyes:  Conjuctiva clear without scleral icterus. Heart:  S1, S2 present without murmurs appreciated. Lungs:  Clear to auscultation bilaterally. No wheezes, rales, or rhonchi. No distress.  Abdomen:  +BS, soft, non-tender and non-distended. No rebound or guarding. No HSM or masses noted. Msk:  Symmetrical without gross deformities. Normal posture. Extremities:  Without edema. Neurologic:  Alert and  oriented x4 Psych:  Normal mood and affect.   Assessment: 56 year old male presenting today for follow-up of PBC and also with concerns for reflux.  PBC:  AMA and ASMA positive.  Underwent liver biopsy in 2020 with findings compatible with PBC though not diagnostic, possible overlap autoimmune hepatitis or drug-induced liver injury also considered.  He was noted to have mild fibrosis (stage I-2 of 4) at that time, started on ursodiol 500 mg twice daily.  Clinically, he has continued to do very well. No alcohol intake.  Liver enzymes normalized in May 2022, last HFP in July 2022 again with LFTs normal.  Due to chronic mild thrombocytopenia and mildly elevated kPa at 9.6 in January 2022, there was some concern for early cirrhosis and EGD was pursued. EGD was completed in August 2022 did not reveal any varices or portal hypertensive gastropathy.  Dr. Gala Romney recommended clinical progress including labs and imaging findings should guide future endoscopy. At this point, we will plan for yearly elastography. Otherwise, we will continue routine 64-monthultrasound and routine laboratory monitoring.  Notably, platelets were within normal limits in December 2022. Of note, he was also found to have mild vitamin A and vitamin D deficiency in July 2022 and started Centrum 50+ daily multivitamin.  We will need to recheck these levels.  He is  up-to-date on yearly TSH (normal in July 2022).  DEXA up-to-date (July 2022).   GERD:  Previously with fairly infrequent reflux symptoms caused by known dietary triggers, but more recently, symptoms have been occurring irrespective of dietary intake.  Trial of Prevacid over-the-counter daily provided resolution of his symptoms, but now that he is taking it as needed, symptoms have returned.  No alarm symptoms.   Plan:  RUQ ultrasound. HFP, vitamin A, vitamin D. Continue Centrum 50+ multivitamin daily. Continue ursodiol 500 mg twice daily. Start Prevacid 30 mg daily 30 minutes before breakfast x12 weeks.  If symptoms resolve, may try stopping Prevacid and monitoring.  If symptoms return 3+ days per week, resume Prevacid daily. Counseled on avoiding common reflux triggers.  Separate instructions provided. Follow-up in 6 months or sooner if needed.

## 2021-07-19 ENCOUNTER — Ambulatory Visit (INDEPENDENT_AMBULATORY_CARE_PROVIDER_SITE_OTHER): Payer: 59 | Admitting: Gastroenterology

## 2021-07-19 ENCOUNTER — Other Ambulatory Visit: Payer: Self-pay

## 2021-07-19 ENCOUNTER — Encounter: Payer: Self-pay | Admitting: Gastroenterology

## 2021-07-19 VITALS — BP 141/90 | HR 94 | Temp 97.2°F | Ht 69.0 in | Wt 186.8 lb

## 2021-07-19 DIAGNOSIS — K219 Gastro-esophageal reflux disease without esophagitis: Secondary | ICD-10-CM

## 2021-07-19 DIAGNOSIS — K743 Primary biliary cirrhosis: Secondary | ICD-10-CM | POA: Diagnosis not present

## 2021-07-19 MED ORDER — LANSOPRAZOLE 30 MG PO CPDR: 30.0000 mg | DELAYED_RELEASE_CAPSULE | Freq: Every day | ORAL | 5 refills | Status: DC

## 2021-07-19 NOTE — Patient Instructions (Addendum)
We we will arrange for you to have an ultrasound of your liver at [place] can have your blood work completed at [facility] at the same time as the ultrasound.   Continue taking Centrum multivitamin daily.  Continue ursodiol 500 mg twice daily.  Start taking Prevacid 30 mg every morning 30 minutes before breakfast to treat your reflux symptoms.  After 12 weeks, if your symptoms have resolved, you can try to stop Prevacid.  If your reflux symptoms return at least 3 times per week, resume Prevacid daily.  Avoid common reflux triggers including spicy foods, tomato-based products, fried, fatty, greasy foods, and chocolate.  Limit carbonated beverages.  We will plan to see back in 6 months.  Do not hesitate to call if you have any questions or concerns prior to your next visit.  It was great to see you again today!  I am glad you are doing well!  Aliene Altes, PA-C [facility]

## 2021-07-21 ENCOUNTER — Telehealth: Payer: Self-pay | Admitting: Gastroenterology

## 2021-07-21 NOTE — Telephone Encounter (Signed)
LMOM for pt to call office back 

## 2021-07-21 NOTE — Telephone Encounter (Signed)
Received drug change request from [place].  Lansoprazole was not covered on patient's drug plan.  Stated preferred alternative was omeprazole.  Filled out form requesting omeprazole capsule 20 mg-take 1 capsule daily before breakfast.  Quantity 30.  Refill 5.  Loma Sousa, please update patient on the above and fax drug change request form back to [facility].

## 2021-07-26 ENCOUNTER — Other Ambulatory Visit (HOSPITAL_COMMUNITY)
Admission: RE | Admit: 2021-07-26 | Discharge: 2021-07-26 | Disposition: A | Discharge status: Home / Self Care | Payer: 59 | Source: Ambulatory Visit | Attending: Gastroenterology | Admitting: Gastroenterology

## 2021-07-26 ENCOUNTER — Other Ambulatory Visit: Payer: Self-pay

## 2021-07-26 ENCOUNTER — Ambulatory Visit (HOSPITAL_COMMUNITY)
Admission: RE | Admit: 2021-07-26 | Discharge: 2021-07-26 | Disposition: A | Discharge status: Home / Self Care | Payer: 59 | Source: Ambulatory Visit | Attending: Gastroenterology | Admitting: Gastroenterology

## 2021-07-26 DIAGNOSIS — K743 Primary biliary cirrhosis: Secondary | ICD-10-CM | POA: Insufficient documentation

## 2021-07-26 LAB — HEPATIC FUNCTION PANEL
ALT: 24 U/L (ref 0–44)
AST: 26 U/L (ref 15–41)
Albumin: 4.7 g/dL (ref 3.5–5.0)
Alkaline Phosphatase: 92 U/L (ref 38–126)
Bilirubin, Direct: 0.3 mg/dL — ABNORMAL HIGH (ref 0.0–0.2)
Indirect Bilirubin: 1.2 mg/dL — ABNORMAL HIGH (ref 0.3–0.9)
Total Bilirubin: 1.5 mg/dL — ABNORMAL HIGH (ref 0.3–1.2)
Total Protein: 8.1 g/dL (ref 6.5–8.1)

## 2021-07-26 LAB — VITAMIN D 25 HYDROXY (VIT D DEFICIENCY, FRACTURES): Vit D, 25-Hydroxy: 27.86 ng/mL — ABNORMAL LOW (ref 30–100)

## 2021-08-01 LAB — VITAMIN A: Vitamin A (Retinoic Acid): 56.1 ug/dL (ref 20.1–62.0)

## 2021-08-04 ENCOUNTER — Other Ambulatory Visit: Payer: Self-pay | Admitting: Gastroenterology

## 2021-08-04 ENCOUNTER — Other Ambulatory Visit: Payer: Self-pay | Admitting: *Deleted

## 2021-08-04 DIAGNOSIS — K743 Primary biliary cirrhosis: Secondary | ICD-10-CM

## 2021-08-04 DIAGNOSIS — R7989 Other specified abnormal findings of blood chemistry: Secondary | ICD-10-CM

## 2021-08-04 MED ORDER — VITAMIN D3 50 MCG (2000 UT) PO CAPS: 4000.0000 [IU] | ORAL_CAPSULE | Freq: Every day | ORAL | 2 refills | Status: AC

## 2021-08-07 ENCOUNTER — Telehealth: Payer: Self-pay | Admitting: Gastroenterology

## 2021-08-07 NOTE — Telephone Encounter (Signed)
Spoke to pt's wife Helene Kelp [facility]) she was wondering about Vit D 59mcg. Informed her to have pt take twice daily.

## 2021-08-07 NOTE — Telephone Encounter (Signed)
Noted  

## 2021-08-07 NOTE — Telephone Encounter (Signed)
[phone]  please call patient wife Helene Kelp, she has some questions about her husband

## 2021-08-07 NOTE — Telephone Encounter (Signed)
Noted. Rx is written for patient to take 2 capsules daily.

## 2021-08-09 ENCOUNTER — Other Ambulatory Visit: Payer: Self-pay | Admitting: *Deleted

## 2021-08-09 DIAGNOSIS — K743 Primary biliary cirrhosis: Secondary | ICD-10-CM

## 2021-09-21 ENCOUNTER — Other Ambulatory Visit: Payer: Self-pay | Admitting: Gastroenterology

## 2021-09-21 DIAGNOSIS — K743 Primary biliary cirrhosis: Secondary | ICD-10-CM

## 2021-09-21 NOTE — Telephone Encounter (Signed)
Last office visit 07/19/21 ?

## 2021-10-12 ENCOUNTER — Encounter: Payer: Self-pay | Admitting: Family Medicine

## 2021-10-12 ENCOUNTER — Ambulatory Visit (INDEPENDENT_AMBULATORY_CARE_PROVIDER_SITE_OTHER): Payer: 59 | Admitting: Family Medicine

## 2021-10-12 VITALS — BP 163/105 | HR 86 | Ht 68.5 in | Wt 189.0 lb

## 2021-10-12 DIAGNOSIS — G40909 Epilepsy, unspecified, not intractable, without status epilepticus: Secondary | ICD-10-CM

## 2021-10-12 DIAGNOSIS — K743 Primary biliary cirrhosis: Secondary | ICD-10-CM | POA: Diagnosis not present

## 2021-10-12 MED ORDER — LEVETIRACETAM ER 500 MG PO TB24: 1000.0000 mg | ORAL_TABLET | Freq: Every day | ORAL | 3 refills | Status: DC

## 2021-10-12 NOTE — Patient Instructions (Addendum)
Below is our plan: ? ?We will continue levetiracetam XR '1000mg'$  daily.  ? ?Please make sure you are consistent with timing of seizure medication. I recommend annual visit with primary care provider (PCP) for complete physical and routine blood work. We will monitor vitamin D level. I recommend daily intake of vitamin D (400-800iu) and calcium (800-'1000mg'$ ) for bone health. Discuss Dexa screening with PCP.  ? ?According to [place] law, you can not drive unless you are seizure / syncope free for at least 6 months and under physician's care. ? ?Please maintain precautions. Do not participate in activities where a loss of awareness could harm you or someone else. No swimming alone, no tub bathing, no hot tubs, no driving, no operating motorized vehicles (cars, ATVs, motocycles, etc), lawnmowers, power tools or firearms. No standing at heights, such as rooftops, ladders or stairs. Avoid hot objects such as stoves, heaters, open fires. Wear a helmet when riding a bicycle, scooter, skateboard, etc. and avoid areas of traffic. Set your water heater to 120 degrees or less.  ? ?Consider management of sleep apnea. CPAP is the gold standard in treatment but there are other options like dental device or Inspire.  ? ?Getting used to CPAP and staying with the treatment long term does take time and patience and discipline. Untreated obstructive sleep apnea when it is moderate to severe can have an adverse impact on cardiovascular health and raise her risk for heart disease, arrhythmias, hypertension, congestive heart failure, stroke and diabetes. Untreated obstructive sleep apnea causes sleep disruption, nonrestorative sleep, and sleep deprivation. This can have an impact on your day to day functioning and cause daytime sleepiness and impairment of cognitive function, memory loss, mood disturbance, and problems focussing. Using CPAP regularly can improve these symptoms ? ?Please make sure you are staying well hydrated. I recommend 50-60  ounces daily. Well balanced diet and regular exercise encouraged. Consistent sleep schedule with 6-8 hours recommended.  ? ?Please continue follow up with care team as directed.  ? ?Follow up with me in 1 year, sooner if needed  ? ?You may receive a survey regarding today's visit. I encourage you to leave honest feed back as I do use this information to improve patient care. Thank you for seeing me today!  ? ? ? ?

## 2021-10-12 NOTE — Progress Notes (Signed)
? ? ?Chief Complaint  ?Patient presents with  ? Follow-up  ?  Room 12, alone  ?Pt states he is doing well, reports no seizures, doing well on sz medication   ? ? ?HISTORY OF PRESENT ILLNESS: ? ?10/12/21 ALL:  ?Jason Mcmahon is a 56 y.o. male here today for follow up for seizures. He continues levetiracetam XR '1000mg'$  daily. He is tolerating medication well without obvious adverse effects. Last seizure about a year ago. He has had two seizures (2020 and 2022[place] He has [place] to [place] multiple times since without any seizure activity. He is driving without difficulty. He works full time.  ? ?He does not wish to resume CPAP. He feels that he sleeps well. He reports getting about 9 hours every night. He continues close follow up with hepatology for PBC. Labs are stable. He continues ursodiol '500mg'$  BID. He maintains healthy lifestyle habits. He does not drink alcohol. He does not smoke. He lives an active lifestyle.  ? 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  He is unaccompanied today (wife brought him, she is in the parking lot with their grandbaby).  I last saw him on 12/06/2020, at which time he reported sleeping well.  We talked about his home sleep test results from 11/29/2020 indicating mild to near moderate obstructive sleep apnea by number of events with an AHI of 14.6/h, O2 nadir 88%.  He was advised to consider treatment with AutoPap.  He declined treatment at the time.  He was advised to continue with his Keppra for seizure prevention.  He indicated no recent seizures. ?  ?Today, 04/12/2021: He reports doing well, no recent seizures, no seizure-like episodes, no new complaints, no neurological symptoms, trying to hydrate well, has been drinking more water and has lost a little bit of weight, working on weight loss.  He has  been working full-time in the office, he makes computer chips.  He had some accommodation from his boss and was able to work from home on Thursdays.  He generally works Sundays through Thursdays every week.  He is hoping to resume driving as of today, as he will be 6 months out from his last seizure.  He denies any recurrent headaches or difficulty tolerating the Keppra, reports full compliance.  He has been sleeping fairly well.  He has regular checkup with his liver specialist.  He had a change in his GI specialist as his original doctor left the practice.  He had an ultrasound and blood work recently.  Ultrasound of the abdomen on 01/04/2021 showed:IMPRESSION: ?Probable fatty infiltration of liver as above. ?  ?Splenomegaly, not significantly changed. ?  ?Remainder of exam unremarkable. ?  ?He does not drink any alcohol, he limits his caffeine. ? ?07/13/19 ALL:  ?Jason Mcmahon is a 56 y.o. male here today for follow up for seizure. He had a sigle, unprovoked seizure on 01/17/2019. Workup has been unremarkable. He was diagnosed with primary biliary cholangitis following seizure. He has worked on healthy lifestyle habits. He has stopped drinking sodas. He limits red meats to once every two weeks. He is eating more fish. He continues to work full time. He denies any seizure like activity since last being seen. He was never started on AED's. He is feeling well today. He does note elevated BP in the setting of white coat syndrome.  He reports that BP is checked regularly at home and usually 130's/80's. He takes lisinopril '20mg'$  at bedtime. ? ? ?REVIEW OF SYSTEMS: Out of a complete 14 system review of symptoms, the patient complains only of the following symptoms, none and all other reviewed systems are negative. ? ? ?ALLERGIES: ?Allergies  ?Allergen Reactions  ? Aspirin Anaphylaxis  ? Lisinopril Cough  ? ? ? ?HOME MEDICATIONS: ?Outpatient Medications Prior to Visit  ?Medication Sig Dispense Refill  ? allopurinol  (ZYLOPRIM) 300 MG tablet Take 300 mg by mouth daily.    ? Cholecalciferol (VITAMIN D3) 50 MCG (2000 UT) capsule Take 2 capsules (4,000 Units total) by mouth daily. 60 capsule 2  ? irbesartan (AVAPRO) 150 MG tablet Take 1 tablet (150 mg total) by mouth daily. 30 tablet 11  ? lansoprazole (PREVACID) 30 MG capsule Take 1 capsule (30 mg total) by mouth daily before breakfast. 30 capsule 5  ? sildenafil (VIAGRA) 25 MG tablet Take 25-100 mg by mouth daily as needed for erectile dysfunction.    ? ursodiol (ACTIGALL) 500 MG tablet TAKE 1 TABLET(500 MG) BY MOUTH TWICE DAILY 180 tablet 3  ? levETIRAcetam (KEPPRA XR) 500 MG 24 hr tablet Take 2 tablets (1,000 mg total) by mouth at bedtime. 180 tablet 3  ? ?No facility-administered medications prior to visit.  ? ? ? ?PAST MEDICAL HISTORY: ?Past Medical History:  ?Diagnosis Date  ? Gout   ? Hypertension   ? Primary biliary cholangitis ([place])   ? Seizures ([place])   ? started on Keppra in April 2022  ? ? ? ?PAST SURGICAL HISTORY: ?Past Surgical History:  ?Procedure Laterality Date  ? arm surgery    ? COLONOSCOPY N/A 02/28/2016  ? Surgeon: Aviva Signs, MD; 2 mm tubular adenoma in the ascending colon, 8 mm tubular adenoma in the sigmoid colon, otherwise normal exam.  Repeat in 3 years.  ? COLONOSCOPY N/A 02/05/2020  ? Surgeon: Daneil Dolin, MD;  Diverticulosis in the sigmoid and descending colon, otherwise normal exam, repeat in 5 years.  ? ESOPHAGOGASTRODUODENOSCOPY N/A 02/22/2021  ? Procedure: ESOPHAGOGASTRODUODENOSCOPY (EGD);  Surgeon: Daneil Dolin, MD;  Location: [facility];  Service: Endoscopy;  Laterality: N/A;  7:30am  ? ? ? ?FAMILY HISTORY: ?Family History  ?Problem Relation Age of Onset  ? Liver disease Father   ?     Alcoholic  ? Hypertension Father   ? Cancer Father   ? Lung cancer Father   ? Diabetes Paternal Grandmother   ? Seizures Paternal Aunt   ? Colon cancer Neg Hx   ? ? ? ?SOCIAL HISTORY: ?Social History  ? ?Socioeconomic History  ? Marital status: Married  ?   Spouse name: Not on file  ? Number of children: Not on file  ? Years of education: Not on file  ? Highest education level: Not on file  ?Occupational History  ? Not on file  ?Tobacco Use  ? Smoking status: Former  ?  Packs/day: 3.00  ?  Years: 18.00  ?  Pack years: 54.00  ?  Types: Cigarettes  ? Smokeless tobacco: Never  ?Vaping Use  ? Vaping Use: Never used  ?Substance and Sexual Activity  ? Alcohol use: No  ? Drug use: No  ? Sexual activity: Not on file  ?Other Topics Concern  ? Not on file  ?Social History Narrative  ? Not on file  ? ?Social Determinants of Health  ? ?Financial Resource Strain: Not on file  ?Food Insecurity: Not on  file  ?Transportation Needs: Not on file  ?Physical Activity: Not on file  ?Stress: Not on file  ?Social Connections: Not on file  ?Intimate Partner Violence: Not on file  ? ? ? ?PHYSICAL EXAM ? ?Vitals:  ? 10/12/21 1440  ?BP: (!) 163/105  ?Pulse: 86  ?Weight: 189 lb (85.7 kg)  ?Height: 5' 8.5" (1.74 m)  ? ?Body mass index is 28.32 kg/m?. ? ?Generalized: Well developed, in no acute distress ? ?Cardiology: normal rate and rhythm, no murmur auscultated  ?Respiratory: clear to auscultation bilaterally   ? ?Neurological examination  ?Mentation: Alert oriented to time, place, history taking. Follows all commands speech and language fluent ?Cranial nerve II-XII: Pupils were equal round reactive to light. Extraocular movements were full, visual field were full on confrontational test. Facial sensation and strength were normal. Head turning and shoulder shrug  were normal and symmetric. ?Motor: The motor testing reveals 5 over 5 strength of all 4 extremities. Good symmetric motor tone is noted throughout.  ?Gait and station: Gait is normal. ? ? ?DIAGNOSTIC DATA (LABS, IMAGING, TESTING) ?- I reviewed patient records, labs, notes, testing and imaging myself where available. ? ?Lab Results  ?Component Value Date  ? WBC 6.5 06/25/2021  ? HGB 14.8 06/25/2021  ? HCT 42.6 06/25/2021  ? MCV 97.7  06/25/2021  ? PLT 153 06/25/2021  ? ?   ?Component Value Date/Time  ? NA 135 06/25/2021 0925  ? NA 145 (H) 11/01/2020 1422  ? K 3.6 06/25/2021 0925  ? CL 103 06/25/2021 0925  ? CO2 20 (L) 06/25/2021 0925  ? GLU

## 2021-10-17 ENCOUNTER — Other Ambulatory Visit: Payer: Self-pay | Admitting: *Deleted

## 2021-10-17 DIAGNOSIS — K743 Primary biliary cirrhosis: Secondary | ICD-10-CM

## 2021-10-17 DIAGNOSIS — R7989 Other specified abnormal findings of blood chemistry: Secondary | ICD-10-CM

## 2021-10-31 ENCOUNTER — Other Ambulatory Visit: Payer: Self-pay | Admitting: Gastroenterology

## 2021-11-01 LAB — HEPATIC FUNCTION PANEL
ALT: 22 IU/L (ref 0–44)
AST: 16 IU/L (ref 0–40)
Albumin: 4.6 g/dL (ref 3.8–4.9)
Alkaline Phosphatase: 104 IU/L (ref 44–121)
Bilirubin Total: 1 mg/dL (ref 0.0–1.2)
Bilirubin, Direct: 0.19 mg/dL (ref 0.00–0.40)
Total Protein: 7.2 g/dL (ref 6.0–8.5)

## 2021-11-01 LAB — VITAMIN D 25 HYDROXY (VIT D DEFICIENCY, FRACTURES): Vit D, 25-Hydroxy: 48.8 ng/mL (ref 30.0–100.0)

## 2021-11-06 ENCOUNTER — Other Ambulatory Visit: Payer: Self-pay | Admitting: *Deleted

## 2021-11-06 DIAGNOSIS — K743 Primary biliary cirrhosis: Secondary | ICD-10-CM

## 2021-11-06 DIAGNOSIS — R7989 Other specified abnormal findings of blood chemistry: Secondary | ICD-10-CM

## 2021-11-20 ENCOUNTER — Telehealth: Payer: Self-pay | Admitting: Gastroenterology

## 2021-11-20 NOTE — Telephone Encounter (Signed)
Patient is due for routine 6 month follow-up in July. He would be appropriate for a Friday virtual visit with me if he has the capability and is ok with this. We can go ahead and get this arranged.

## 2022-01-04 ENCOUNTER — Telehealth: Payer: Self-pay | Admitting: Internal Medicine

## 2022-01-04 NOTE — Telephone Encounter (Signed)
Recall for ultrasound 

## 2022-01-05 ENCOUNTER — Telehealth: Payer: 59 | Admitting: Gastroenterology

## 2022-01-07 NOTE — Progress Notes (Signed)
Primary Care Physician:  Cory Munch, PA-C  Primary GI: Dr. Gala Romney  Patient Location: Home   Provider Location: [facility] office   Reason for Visit: Follow-up   Persons present on the virtual encounter, with roles: Aliene Altes, PA-C (Provider), Jason Mcmahon (patient)   Total time (minutes) spent on medical discussion: 5 minutes  Virtual Visit via video Note Due to COVID-19, visit is conducted virtually and was requested by patient.   I connected with Jason Mcmahon on 01/09/22 at  3:30 PM EDT by video and verified that I am speaking with the correct person using two identifiers.   I discussed the limitations, risks, security and privacy concerns of performing an evaluation and management service by video and the availability of in person appointments. I also discussed with the patient that there may be a patient responsible charge related to this service. The patient expressed understanding and agreed to proceed.  Chief Complaint  Patient presents with   Follow-up     History of Present Illness: Jason Mcmahon is a 56 year old male presenting today for follow-up with PVC and GERD.  He has history of PBC with elevated alk phos, positive AMA, and positive ASMA.  ANA negative.  Liver biopsy in December 2020 with findings compatible with PBC though not diagnostic.  Possible overlap autoimmune hepatitis or drug-induced liver injury also considered.  Also with mild fibrosis (stage I-2 of 4) he started on Urso 500 mg twice daily.  Liver enzymes normalized in May 2022.  RUQ ultrasound in January 2022 with normal-appearing liver, kPa 9.6.  Due to chronic mild thrombocytopenia and mildly elevated kPa at 9.6 in January 2022, there was some concern for early cirrhosis, so EGD completed 02/22/2021: Single 5 mm erosion straddling GE junction, otherwise normal esophagus, small hiatal hernia, otherwise normal exam.  Dr. Gala Romney stated clinical progress including labs, imaging findings  should guide  future endoscopy. He has completed hepatitis A and B vaccines. Found to have vitamin A and D deficiencies in 2022 and started Centrum 50+ multivitamin.   Also with history of heartburn and adenomatous colon polyps, due for repeat in 2026.  Last seen in our office 07/19/2021.  He had no signs or symptoms of decompensated liver disease.  Noted reflux symptoms occurring more frequently irrespective of dietary intake.  Tried Prevacid over-the-counter on a daily basis which worked well, but had decreased to as needed and symptoms had returned.  He had no alarm symptoms. Plan included RUQ [place], labs, continue multi vitamin and urso, start prevacid 30 mg daily x 12 weeks, but can continue daily if symptoms return 3+ days a week.   RUQ [place] 07/26/21: Fatty infiltration of the liver.   Labs completed 07/26/21.  LFTs within normal limits.  Slight elevation of total bilirubin at 1.5, but primarily indirect (1.2).  Vitamin A within normal limits.  Vitamin D low at 27.6.  Recommended stopping multivitamin starting vitamin D3 4000 IU daily.  Case was reviewed with Roosevelt Locks, NP with [facility] who recommended continuing to monitor liver enzymes and bilirubin.  Direct bilirubin continued to rise, may need to consider [place], but at this time, suspect patient had component of Gilbert's syndrome as bilirubin was primarily indirect.  Recommended repeat HFP and vitamin D in 3 months.   Labs completed in 10/31/21: LFTs and bilirubin within normal limits.  Vitamin D normalized.  Recommended decreasing vitamin D to 1 capsule daily and repeating labs in 4 months.  Today:  PBC:  Continues with urso 500 mg BID. Feels well. Denies abdominal distention, lower extremity edema, yellowing of the eyes or skin, itching, bruising/bleeding, mental status changes/confusion.  GERD:  Taking prevacid 30 mg daily. Doing well.  Denies nausea, vomiting, abdominal pain, dysphagia.  Bowels moving well.  No BRBPR or  melena.  Past Medical History:  Diagnosis Date   Acid reflux    Gout    Hypertension    Primary biliary cholangitis ([place])    Seizures ([place])    started on Keppra in April 2022     Past Surgical History:  Procedure Laterality Date   arm surgery     COLONOSCOPY N/A 02/28/2016   Surgeon: Aviva Signs, MD; 2 mm tubular adenoma in the ascending colon, 8 mm tubular adenoma in the sigmoid colon, otherwise normal exam.  Repeat in 3 years.   COLONOSCOPY N/A 02/05/2020   Surgeon: Daneil Dolin, MD;  Diverticulosis in the sigmoid and descending colon, otherwise normal exam, repeat in 5 years.   ESOPHAGOGASTRODUODENOSCOPY N/A 02/22/2021   Surgeon: Daneil Dolin, MD;  Single 5 mm erosion straddling GE junction, otherwise normal esophagus, small hiatal hernia, otherwise normal exam.     Current Meds  Medication Sig   allopurinol (ZYLOPRIM) 300 MG tablet Take 300 mg by mouth daily.   Cholecalciferol (VITAMIN D3) 50 MCG (2000 UT) capsule Take 2 capsules (4,000 Units total) by mouth daily. (Patient taking differently: Take 2,000 Units by mouth daily.)   irbesartan (AVAPRO) 150 MG tablet Take 1 tablet (150 mg total) by mouth daily.   lansoprazole (PREVACID) 30 MG capsule Take 1 capsule (30 mg total) by mouth daily before breakfast.   levETIRAcetam (KEPPRA XR) 500 MG 24 hr tablet Take 2 tablets (1,000 mg total) by mouth at bedtime.   lisinopril (ZESTRIL) 20 MG tablet Take 20 mg by mouth daily.   sildenafil (VIAGRA) 25 MG tablet Take 25-100 mg by mouth daily as needed for erectile dysfunction.   ursodiol (ACTIGALL) 500 MG tablet TAKE 1 TABLET(500 MG) BY MOUTH TWICE DAILY     Family History  Problem Relation Age of Onset   Liver disease Father        Alcoholic   Hypertension Father    Cancer Father    Lung cancer Father    Diabetes Paternal Grandmother    Seizures Paternal Aunt    Colon cancer Neg Hx     Social History   Socioeconomic History   Marital status: Married    Spouse  name: Not on file   Number of children: Not on file   Years of education: Not on file   Highest education level: Not on file  Occupational History   Not on file  Tobacco Use   Smoking status: Former    Packs/day: 3.00    Years: 18.00    Total pack years: 54.00    Types: Cigarettes   Smokeless tobacco: Never  Vaping Use   Vaping Use: Never used  Substance and Sexual Activity   Alcohol use: No   Drug use: No   Sexual activity: Not on file  Other Topics Concern   Not on file  Social History Narrative   Not on file   Social Determinants of Health   Financial Resource Strain: Not on file  Food Insecurity: Not on file  Transportation Needs: Not on file  Physical Activity: Not on file  Stress: Not on file  Social Connections: Not on file     Review of  Systems: Gen: Denies fever, chills, cold or flulike symptoms, presyncope, syncope. CV: Denies chest pain, palpitations.  Resp: Denies dyspnea, cough.  GI: see HPI Heme: See HPI  Observations/Objective: No distress. Alert and oriented. Pleasant. Well nourished. Normal mood and affect. Unable to perform complete physical exam due to video encounter.    Assessment:  56 year old male presenting today for follow-up of PBC and reflux.   PBC:  AMA and ASMA positive.  Underwent liver biopsy in 2020 with findings compatible with PBC though not diagnostic, possible overlap autoimmune hepatitis or drug-induced liver injury also considered.  He was noted to have mild fibrosis (stage I-2 of 4) at that time, started on ursodiol 500 mg twice daily.  Clinically, he has continued to do very well. No alcohol intake.  Liver enzymes normalized in May 2022 with most recent labs May 2023. Due to chronic mild thrombocytopenia and mildly elevated kPa at 9.6 in January 2022, there was some concern for early cirrhosis and EGD was pursued in August 2022, but did not eveal any varices or portal hypertensive gastropathy.  Dr. Gala Romney recommended clinical  progress including labs and imaging findings should guide future endoscopy.  Clinically, he remains well compensated.  We will plan to update his elastography as his last elastography was 1 year ago.  We will update routine labs in November.  DEXA is up-to-date (July 2022). He continues on vitamin D supplementation with vitamin D normalized in May. Previously with vitamin A deficiency, but this normalize in January.   Reflux:  Previously with fairly infrequent reflux symptoms caused by known dietary triggers, but with increasing symptoms in January irrespective of dietary intake. Symptoms now well controlled on Prevacid 30 mg daily. No alarm symptoms. Advised that he can try tapering off and monitoring for recurrent symptoms.    Plan: RUQ [place] with elastography.  CBC, HFP, vitamin D, and TSH in November 2023. Nursing staff will arrange.  Continue ursodiol 500 mg BID.  Continue Vitamin D 2000 IU daily.  Try tapering off Prevacid and monitor for recurrent reflux symptoms. If symptoms return 2-3 times weekly, resume daily dosing.  Reinforced GERD diet/lifestyle. Separate written instructions provided.  Follow-up in 6 months or sooner if needed.      I discussed the assessment and treatment plan with the patient. The patient was provided an opportunity to ask questions and all were answered. The patient agreed with the plan and demonstrated an understanding of the instructions.   The patient was advised to call back or seek an in-person evaluation if the symptoms worsen or if the condition fails to improve as anticipated.  I provided 5 minutes of video-face-to-face time during this encounter.  Aliene Altes, PA-C [facility]  01/08/2022

## 2022-01-08 ENCOUNTER — Telehealth: Payer: Self-pay | Admitting: *Deleted

## 2022-01-08 ENCOUNTER — Other Ambulatory Visit: Payer: Self-pay | Admitting: *Deleted

## 2022-01-08 ENCOUNTER — Telehealth (INDEPENDENT_AMBULATORY_CARE_PROVIDER_SITE_OTHER): Payer: 59 | Admitting: Gastroenterology

## 2022-01-08 ENCOUNTER — Encounter: Payer: Self-pay | Admitting: Gastroenterology

## 2022-01-08 VITALS — Ht 68.0 in | Wt 191.0 lb

## 2022-01-08 DIAGNOSIS — K219 Gastro-esophageal reflux disease without esophagitis: Secondary | ICD-10-CM

## 2022-01-08 DIAGNOSIS — K743 Primary biliary cirrhosis: Secondary | ICD-10-CM

## 2022-01-08 NOTE — Telephone Encounter (Signed)
Jason Mcmahon, you are scheduled for a virtual visit with your provider today.  Just as we do with appointments in the office, we must obtain your consent to participate.  Your consent will be active for this visit and any virtual visit you may have with one of our providers in the next 365 days.  If you have a MyChart account, I can also send a copy of this consent to you electronically.  All virtual visits are billed to your insurance company just like a traditional visit in the office.  As this is a virtual visit, video technology does not allow for your provider to perform a traditional examination.  This may limit your provider's ability to fully assess your condition.  If your provider identifies any concerns that need to be evaluated in person or the need to arrange testing such as labs, EKG, etc, we will make arrangements to do so.  Although advances in technology are sophisticated, we cannot ensure that it will always work on either your end or our end.  If the connection with a video visit is poor, we may have to switch to a telephone visit.  With either a video or telephone visit, we are not always able to ensure that we have a secure connection.   I need to obtain your verbal consent now.   Are you willing to proceed with your visit today? Pt consent to virtual visit.

## 2022-01-08 NOTE — Patient Instructions (Addendum)
We will arrange for you to have an ultrasound with elastography of your liver at [facility].  Continue taking ursodiol 500 mg twice daily.  Continue taking 1 capsule of vitamin D daily.  You may try tapering off Prevacid and monitoring for recurrent reflux symptoms.  Take Prevacid every other day for 1 week, every 2 days for 1 week, every 3 days for 1 week, then stop.  If you have recurrent reflux symptoms 2-3 times per week, resume Prevacid daily.  Follow a GERD diet:  Avoid fried, fatty, greasy, spicy, citrus foods. Avoid caffeine and carbonated beverages. Avoid chocolate. Try eating 4-6 small meals a day rather than 3 large meals. Do not eat within 3 hours of laying down. Prop head of bed up on wood or bricks to create a 6 inch incline.   You will be due for updated blood work in November.  We will arrange for this.  We will plan for routine follow-up in 6 months.  Do not hesitate to call if you have any questions or concerns prior to next visit.  Aliene Altes, PA-C [facility]

## 2022-01-09 ENCOUNTER — Encounter: Payer: Self-pay | Admitting: Gastroenterology

## 2022-01-10 ENCOUNTER — Telehealth: Payer: Self-pay | Admitting: *Deleted

## 2022-01-10 NOTE — Telephone Encounter (Signed)
Called pt, aware of [place] appt details. He voiced understanding.

## 2022-01-15 ENCOUNTER — Other Ambulatory Visit: Payer: Self-pay | Admitting: Gastroenterology

## 2022-01-16 ENCOUNTER — Telehealth: Payer: Self-pay | Admitting: *Deleted

## 2022-01-16 NOTE — Telephone Encounter (Signed)
Pt '[facility]) called and would like a prescription of Omeprazole '40mg'$  sent to pharmacy [facility]) just in case heartburn continues.

## 2022-01-18 ENCOUNTER — Ambulatory Visit (HOSPITAL_COMMUNITY)
Admission: RE | Admit: 2022-01-18 | Discharge: 2022-01-18 | Disposition: A | Discharge status: Home / Self Care | Payer: 59 | Source: Ambulatory Visit | Attending: Gastroenterology | Admitting: Gastroenterology

## 2022-01-18 DIAGNOSIS — K743 Primary biliary cirrhosis: Secondary | ICD-10-CM | POA: Diagnosis present

## 2022-01-19 IMAGING — US US ABDOMEN LIMITED
1 series · 14 of 25 positions shown · non-contrast
Comparison: Ultrasound and CT dated 04/05/2019

CLINICAL DATA: Hepatic cirrhosis

EXAM:
ULTRASOUND ABDOMEN LIMITED RIGHT UPPER QUADRANT

[Series 1: us abdomen limited ruq · 14 of 71 slices shown]
[im 1/71]
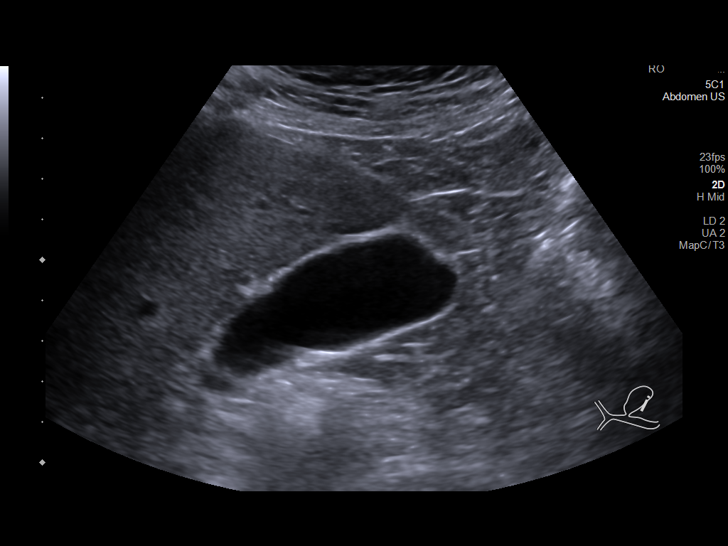
[im 6/71]
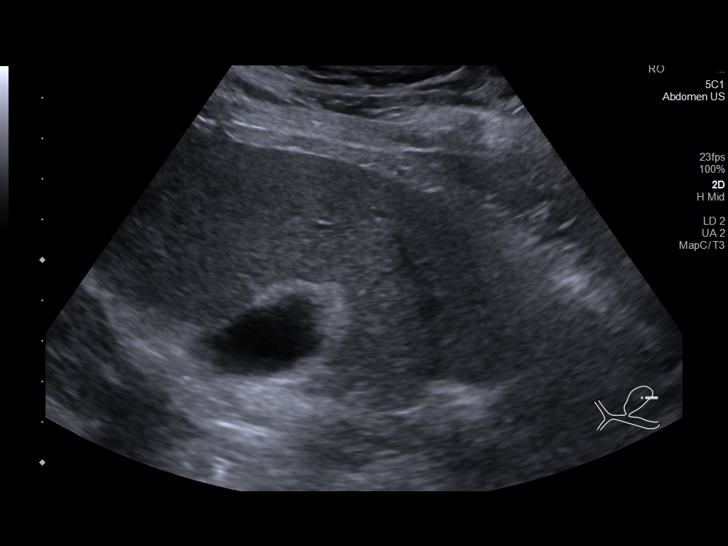
[im 12/71]
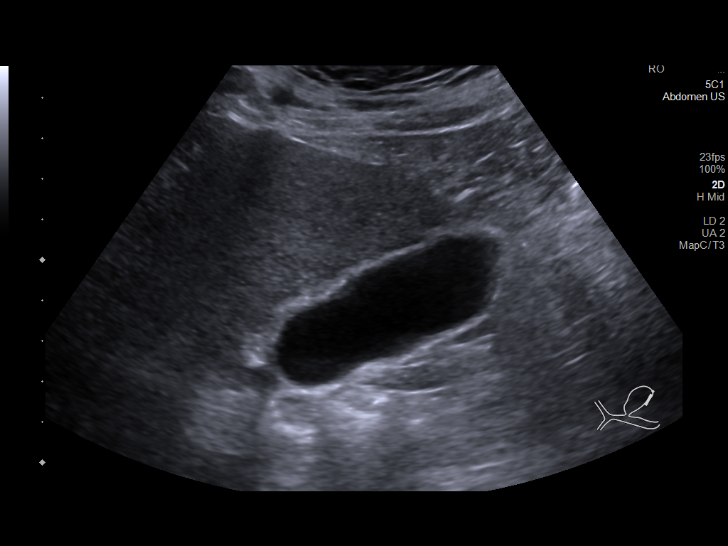
[im 18/71]
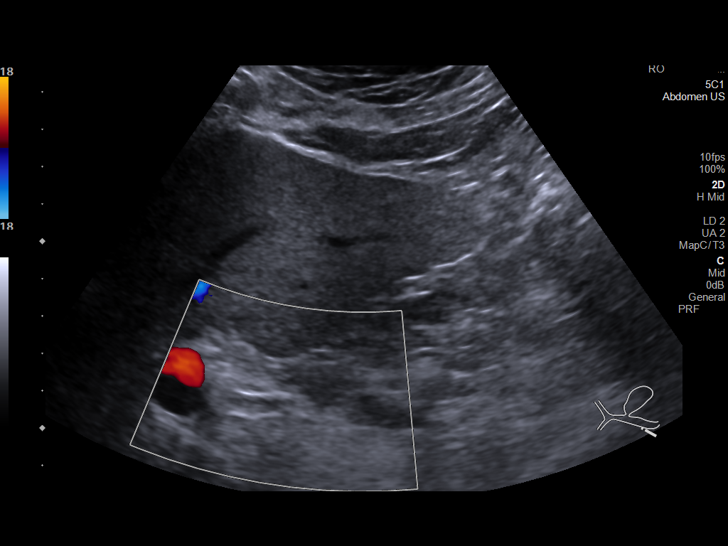
[im 24/71]
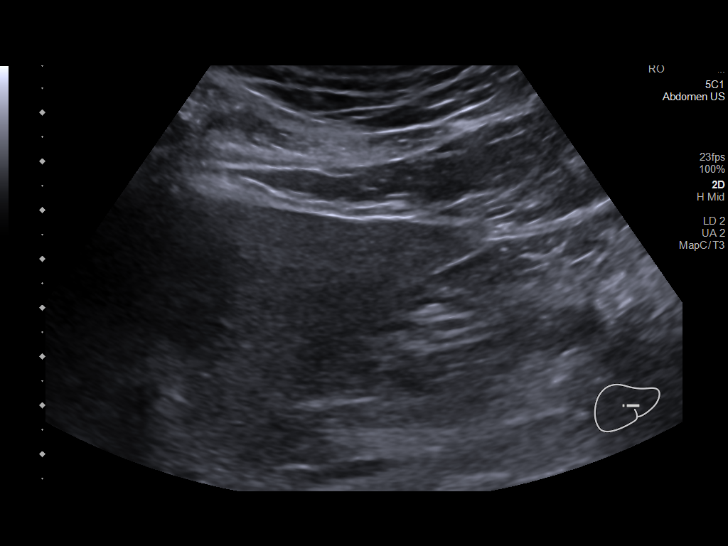
[im 27/71]
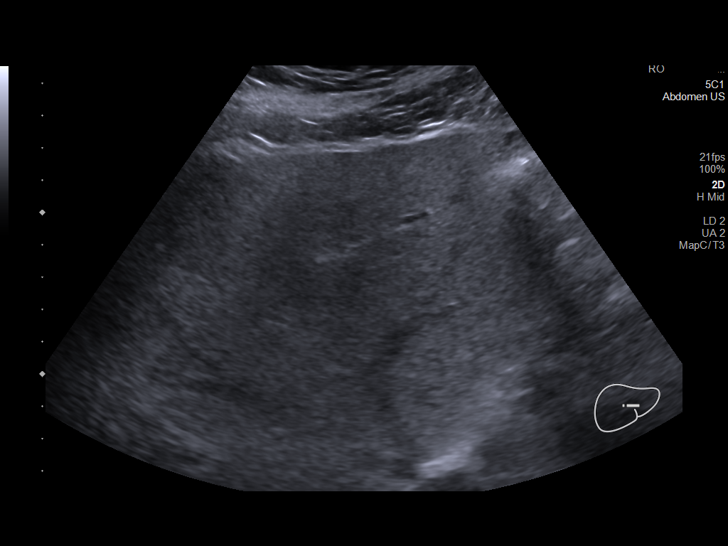
[im 33/71]
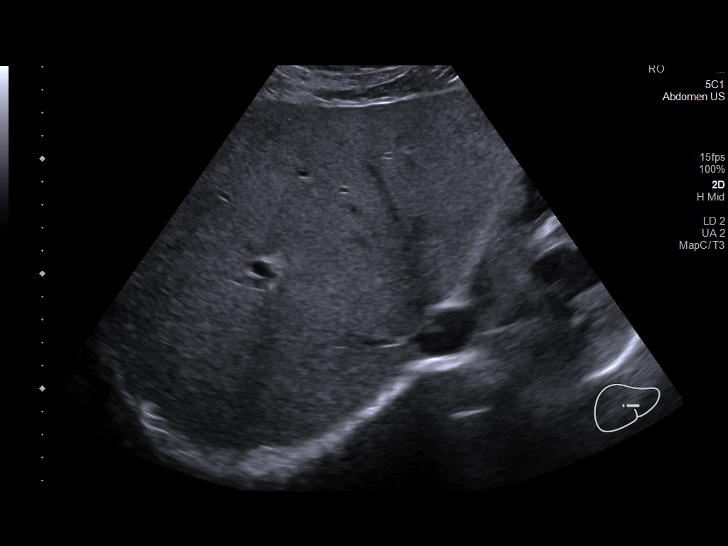
[im 38/71]
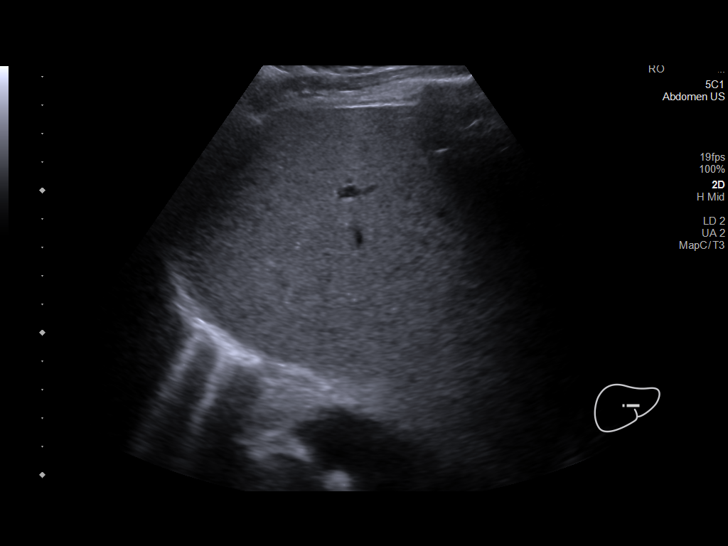
[im 44/71]
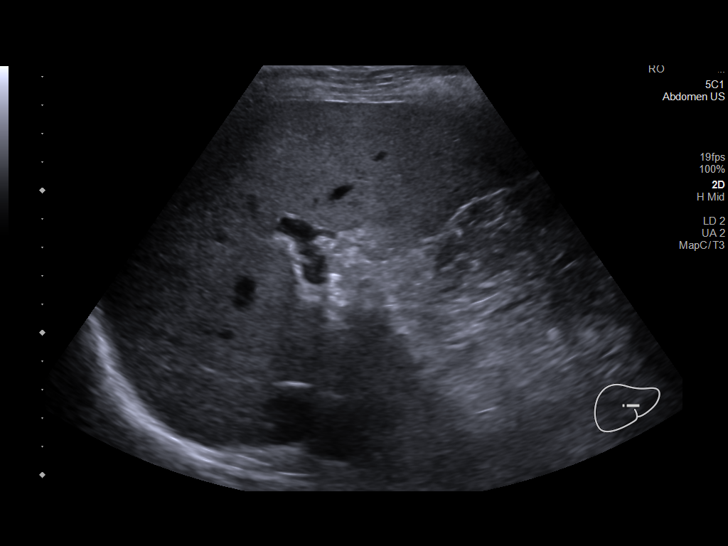
[im 47/71]
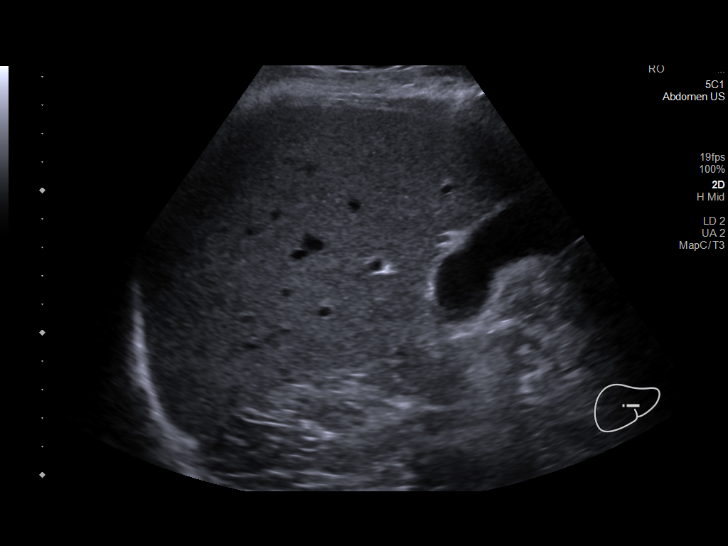
[im 53/71]
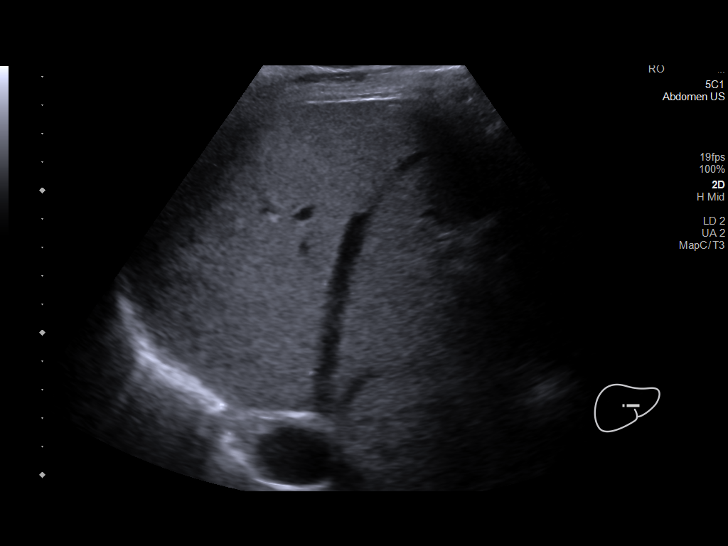
[im 59/71]
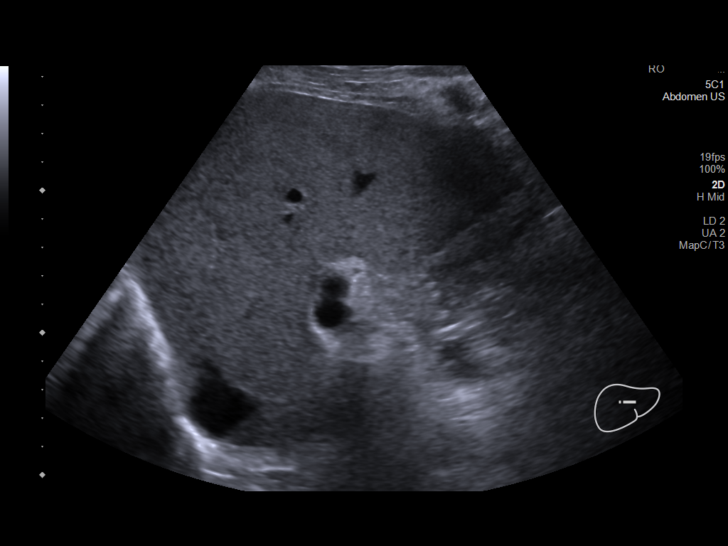
[im 65/71]
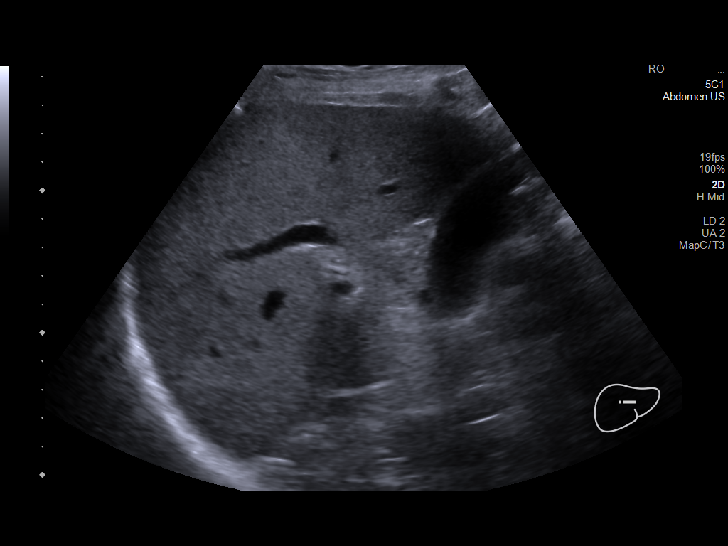
[im 71/71]
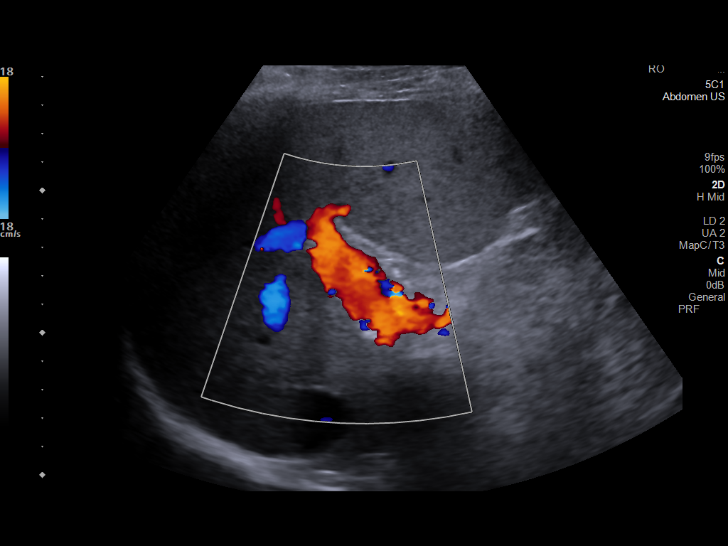

[14 of 25 positions shown; findings below may reference images not displayed]

FINDINGS: Gallbladder:

No gallstones or wall thickening visualized. No sonographic Murphy
sign noted by sonographer.

Common bile duct:

Diameter: 4 mm.

Liver:

No focal lesion identified. Mildly increased hepatic parenchymal
echogenicity. No appreciable surface nodularity. Portal vein is
patent on color Doppler imaging with normal direction of blood flow
towards the liver.

Other: None.
IMPRESSION: 1. The echogenicity of the liver is increased. This is a nonspecific
finding but is most commonly seen with fatty infiltration of the
liver. Other hepatocellular processes including hepatitis and
cirrhosis can have a similar appearance. There are no obvious focal
liver lesions.
2. Unremarkable sonographic appearance of the gallbladder.

## 2022-02-06 ENCOUNTER — Telehealth: Payer: Self-pay | Admitting: *Deleted

## 2022-02-06 ENCOUNTER — Other Ambulatory Visit: Payer: Self-pay | Admitting: Gastroenterology

## 2022-02-06 DIAGNOSIS — K219 Gastro-esophageal reflux disease without esophagitis: Secondary | ICD-10-CM

## 2022-02-06 MED ORDER — LANSOPRAZOLE 30 MG PO CPDR: 30.0000 mg | DELAYED_RELEASE_CAPSULE | Freq: Every day | ORAL | 5 refills | Status: DC

## 2022-02-06 NOTE — Telephone Encounter (Signed)
Okay to resume Prevacid 30 mg daily.  I will send in a new prescription for him.

## 2022-02-06 NOTE — Telephone Encounter (Signed)
Pt's wife Helene Kelp [facility]) called and stated pt is having heart burn again and would like to start back on Prevacid.

## 2022-02-07 NOTE — Telephone Encounter (Signed)
Noted  

## 2022-03-14 ENCOUNTER — Telehealth: Payer: Self-pay | Admitting: *Deleted

## 2022-03-14 NOTE — Telephone Encounter (Signed)
Spoke to [employer] and they will only pay for Omeprazole. Please send a prescription to [facility] on [address]

## 2022-03-15 ENCOUNTER — Other Ambulatory Visit: Payer: Self-pay | Admitting: Gastroenterology

## 2022-03-15 DIAGNOSIS — K219 Gastro-esophageal reflux disease without esophagitis: Secondary | ICD-10-CM

## 2022-03-15 MED ORDER — OMEPRAZOLE 40 MG PO CPDR: 40.0000 mg | DELAYED_RELEASE_CAPSULE | Freq: Every day | ORAL | 3 refills | Status: DC

## 2022-03-15 NOTE — Telephone Encounter (Signed)
Rx sent 

## 2022-03-15 NOTE — Telephone Encounter (Signed)
Noted  

## 2022-05-02 ENCOUNTER — Telehealth: Payer: Self-pay | Admitting: *Deleted

## 2022-05-02 ENCOUNTER — Other Ambulatory Visit: Payer: Self-pay | Admitting: *Deleted

## 2022-05-02 DIAGNOSIS — K743 Primary biliary cirrhosis: Secondary | ICD-10-CM

## 2022-05-02 NOTE — Telephone Encounter (Signed)
Noted  

## 2022-05-02 NOTE — Telephone Encounter (Signed)
Mailed lab requisitions

## 2022-06-05 ENCOUNTER — Telehealth: Payer: Self-pay | Admitting: Internal Medicine

## 2022-06-05 NOTE — Telephone Encounter (Signed)
Recall sent 

## 2022-06-06 NOTE — Telephone Encounter (Signed)
Pt had left vm re: [place]  [facility]

## 2022-06-14 ENCOUNTER — Other Ambulatory Visit: Payer: Self-pay | Admitting: *Deleted

## 2022-06-14 DIAGNOSIS — K743 Primary biliary cirrhosis: Secondary | ICD-10-CM

## 2022-06-14 NOTE — Telephone Encounter (Signed)
[place] has been scheduled for 07/23/22 at 9:30 am, arrive at 9:15 am, nothing to eat or drink after midnight. Pt's spouse Jason Mcmahon was made aware of appointment.

## 2022-06-17 ENCOUNTER — Other Ambulatory Visit: Payer: Self-pay | Admitting: Internal Medicine

## 2022-07-08 ENCOUNTER — Other Ambulatory Visit: Payer: Self-pay | Admitting: Gastroenterology

## 2022-07-08 DIAGNOSIS — K219 Gastro-esophageal reflux disease without esophagitis: Secondary | ICD-10-CM

## 2022-07-10 ENCOUNTER — Telehealth: Payer: Self-pay | Admitting: *Deleted

## 2022-07-10 ENCOUNTER — Encounter: Payer: Self-pay | Admitting: Internal Medicine

## 2022-07-10 DIAGNOSIS — K219 Gastro-esophageal reflux disease without esophagitis: Secondary | ICD-10-CM

## 2022-07-10 MED ORDER — OMEPRAZOLE 40 MG PO CPDR: 40.0000 mg | DELAYED_RELEASE_CAPSULE | Freq: Every day | ORAL | 3 refills | Status: DC

## 2022-07-10 NOTE — Telephone Encounter (Signed)
Pt called and would like a refill on omeprazole sent to [facility] on [address]  Abbott Pao last [place] 01/08/2022

## 2022-07-10 NOTE — Addendum Note (Signed)
Addended by: Annitta Needs on: 07/10/2022 03:27 PM   Modules accepted: Orders

## 2022-07-13 IMAGING — US US ABDOMEN LIMITED W/ ELASTOGRAPHY
1 series · 12 of 25 positions shown · non-contrast
Comparison: Limited right upper quadrant ultrasound on 01/29/2020

CLINICAL DATA: Primary biliary cirrhosis.

EXAM:
US ABDOMEN LIMITED - RIGHT UPPER QUADRANT
ULTRASOUND HEPATIC ELASTOGRAPHY
TECHNIQUE: Sonography of the right upper quadrant was performed. In addition,
ultrasound elastography evaluation of the liver was performed. A
region of interest was placed within the right lobe of the liver.
Following application of a compressive sonographic pulse, tissue
compressibility was assessed. Multiple assessments were performed at
the selected site. Median tissue compressibility was determined.
Previously, hepatic stiffness was assessed by shear wave velocity.
Based on recently published Society of Radiologists in Ultrasound
consensus article, reporting is now recommended to be performed in
the SI units of pressure (kiloPascals) representing hepatic
stiffness/elasticity. The obtained result is compared to the
published reference standards. (cACLD = compensated Advanced Chronic
Liver Disease)

[Series 1: us abdomen ruq w/elastography · 12 of 60 slices shown]
[im 3/60]
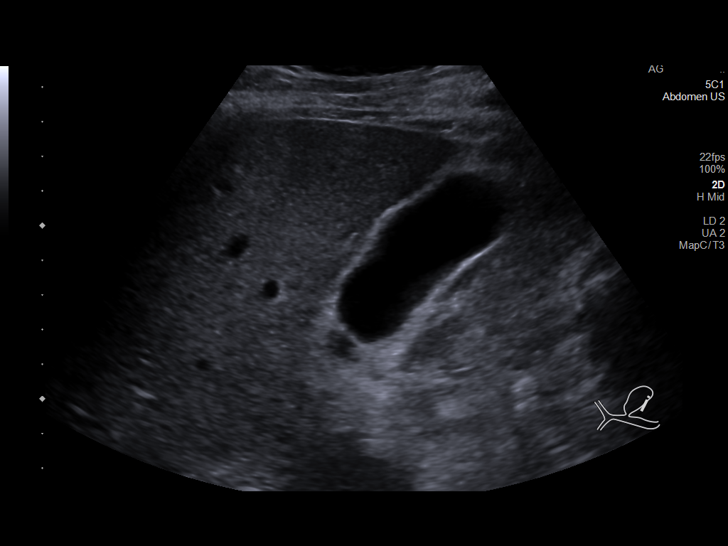
[im 8/60]
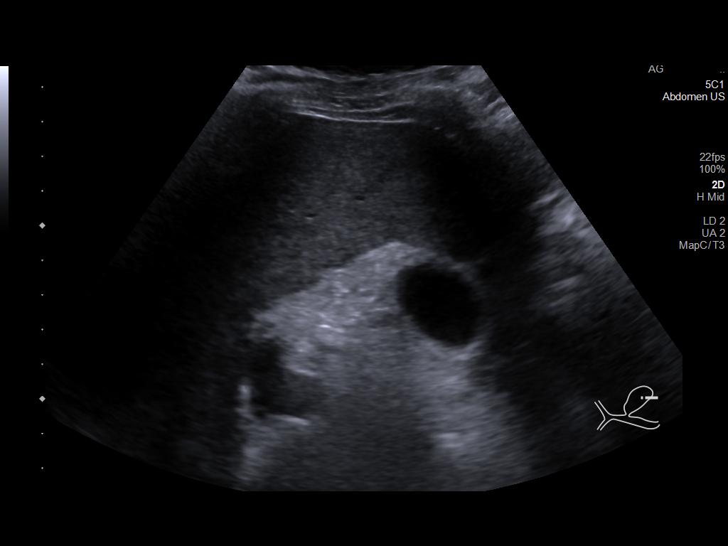
[im 13/60]
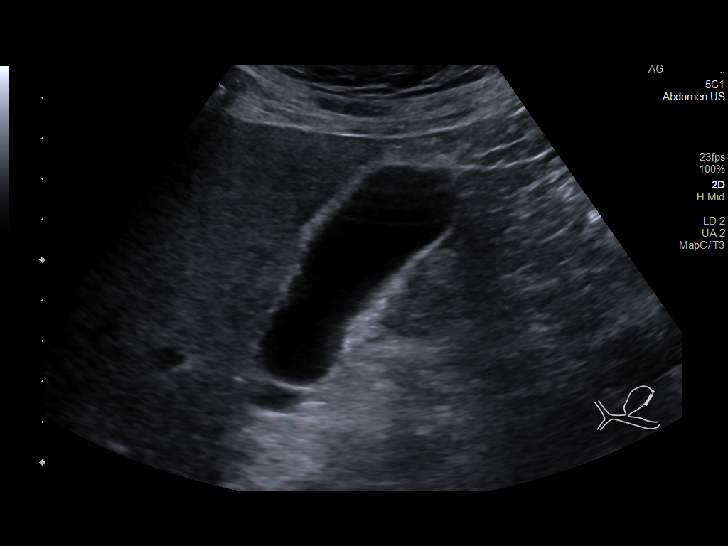
[im 18/60]
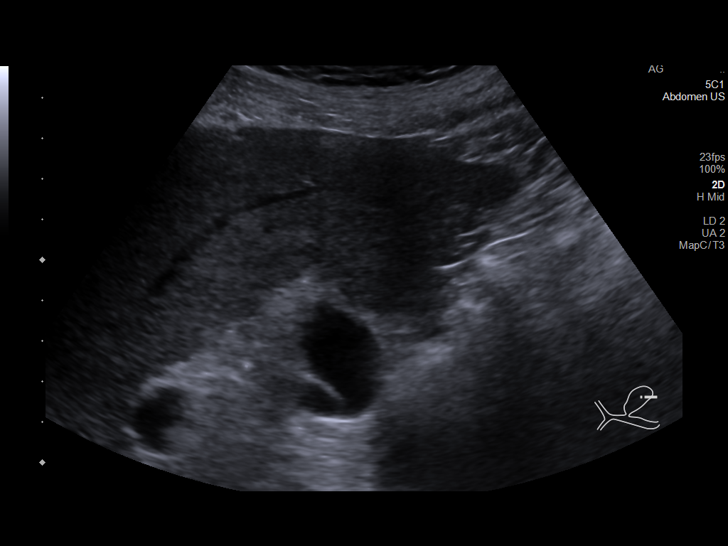
[im 23/60]
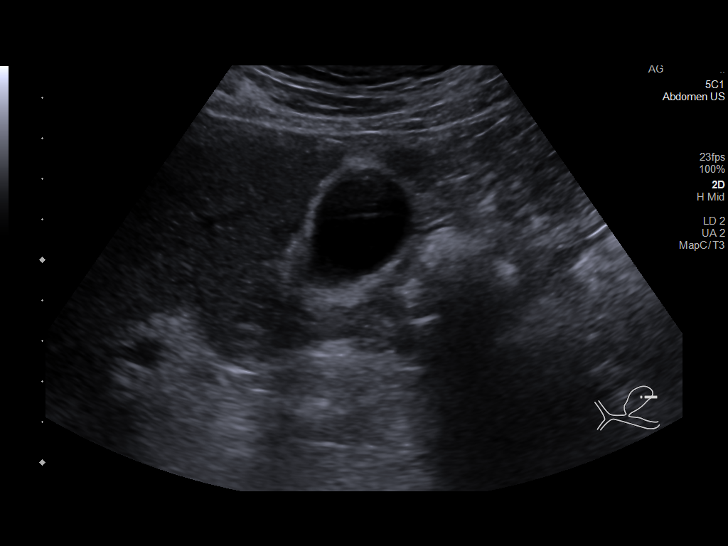
[im 28/60]
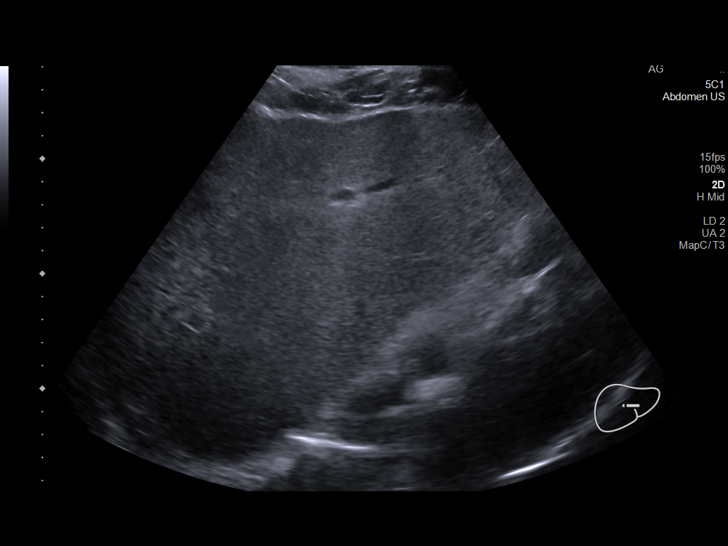
[im 32/60]
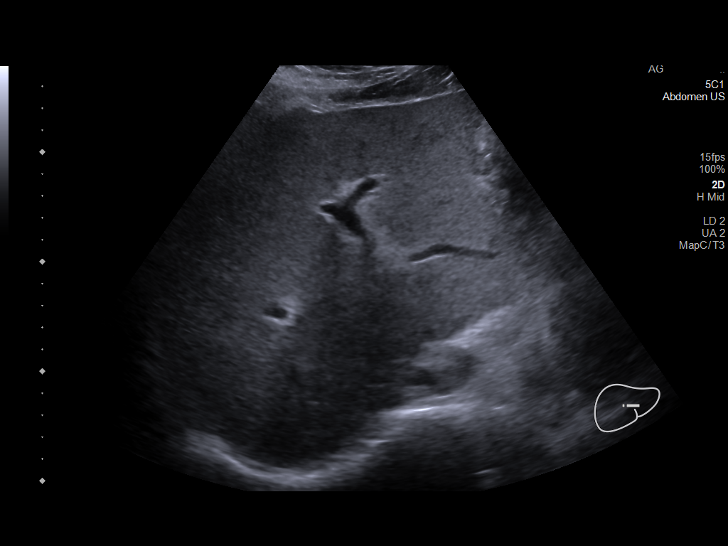
[im 37/60]
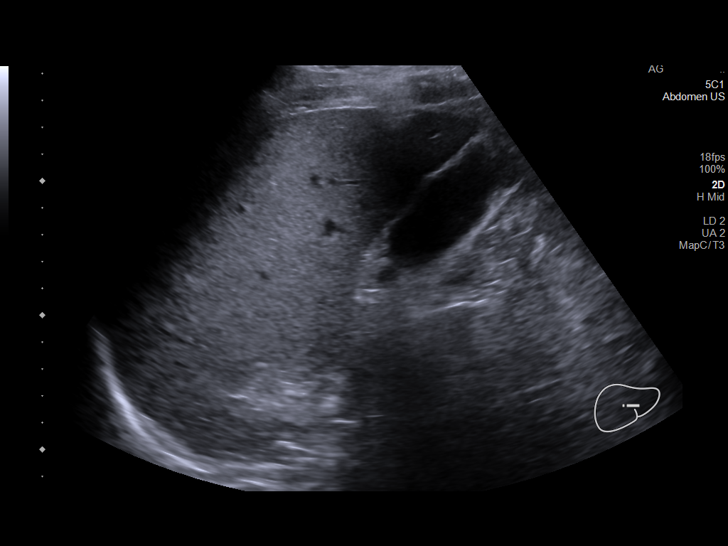
[im 42/60]
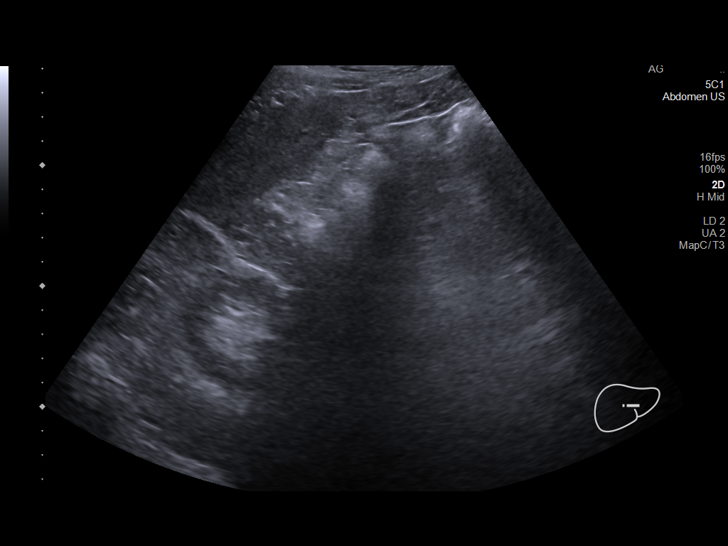
[im 47/60]
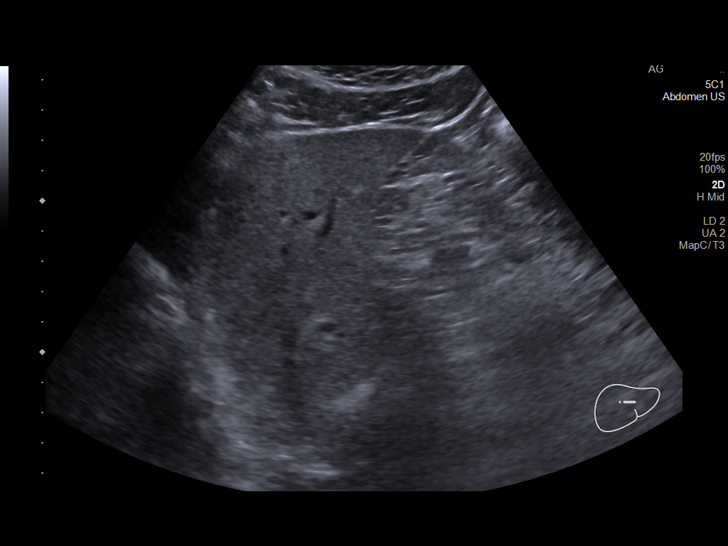
[im 52/60]
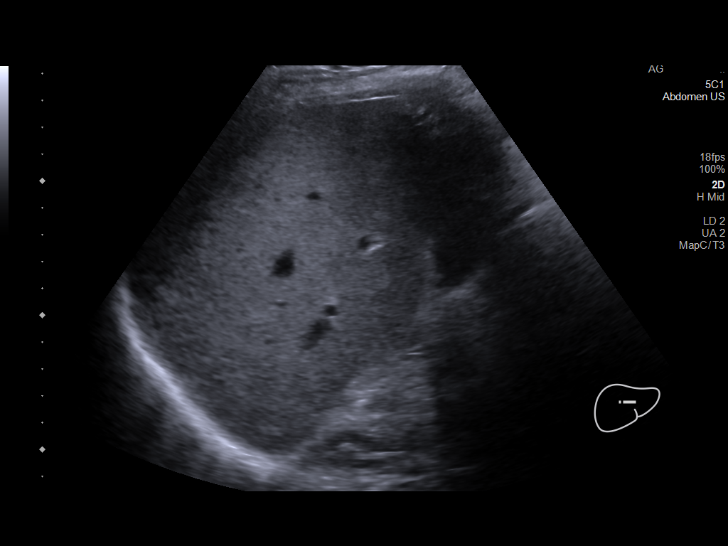
[im 57/60]
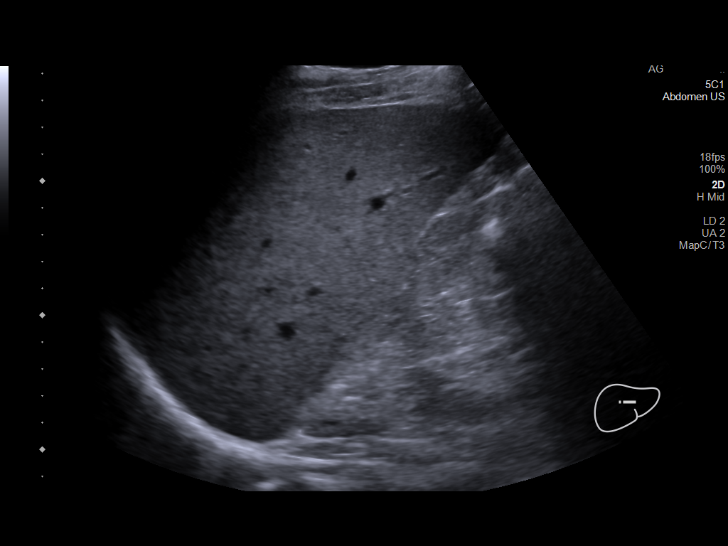

[12 of 25 positions shown; findings below may reference images not displayed]

FINDINGS: ULTRASOUND ABDOMEN LIMITED RIGHT UPPER QUADRANT

Gallbladder:

No gallstones or wall thickening visualized. No sonographic Murphy
sign noted.

Common bile duct:

Diameter: 5 mm, within normal limits.

Liver:

No focal lesion identified. Within normal limits in parenchymal
echogenicity. Portal vein is patent on color Doppler imaging with
normal direction of blood flow towards the liver.

ULTRASOUND HEPATIC ELASTOGRAPHY

Device: Siemens Helix VTQ

Patient position: Supine

Transducer [id number]

Number of measurements: 10

Hepatic segment:  8

Median kPa:

IQR:

IQR/Median kPa ratio:

Data quality:  Good

Diagnostic category: >9 kPa and ?13 kPa: suggestive of cACLD, but
needs further testing

The use of hepatic elastography is applicable to patients with viral
hepatitis and non-alcoholic fatty liver disease. At this time, there
is insufficient data for the referenced cut-off values and use in
other causes of liver disease, including alcoholic liver disease.
Patients, however, may be assessed by elastography and serve as
their own reference standard/baseline.

In patients with non-alcoholic liver disease, the values suggesting
compensated advanced chronic liver disease (cACLD) may be lower, and
patients may need additional testing with elasticity results of [DATE]
kPa.

Please note that abnormal hepatic elasticity and shear wave
velocities may also be identified in clinical settings other than
with hepatic fibrosis, such as: acute hepatitis, elevated right
heart and central venous pressures including use of beta blockers,
Imthiyaaz disease (Alphavera), infiltrative processes such as
mastocytosis/amyloidosis/infiltrative tumor/lymphoma, extrahepatic
cholestasis, with hyperemia in the post-prandial state, and with
liver transplantation. Correlation with patient history, laboratory
data, and clinical condition recommended.

Diagnostic Categories:

< or =5 kPa: high probability of being normal

< or =9 kPa: in the absence of other known clinical signs, rules [DATE] kPa and ?13 kPa: suggestive of cACLD, but needs further testing

>13 kPa: highly suggestive of cACLD

> or =17 kPa: highly suggestive of cACLD with an increased
probability of clinically significant portal hypertension
IMPRESSION: ULTRASOUND RUQ:

Normal study.  No hepatobiliary abnormality identified.

ULTRASOUND HEPATIC ELASTOGRAPHY:

Median kPa:

Diagnostic category: >9 kPa and ?13 kPa: suggestive of cACLD, but
needs further testing

## 2022-07-23 ENCOUNTER — Ambulatory Visit (HOSPITAL_COMMUNITY)
Admission: RE | Admit: 2022-07-23 | Discharge: 2022-07-23 | Disposition: A | Discharge status: Home / Self Care | Payer: 59 | Source: Ambulatory Visit | Attending: Gastroenterology | Admitting: Gastroenterology

## 2022-07-23 DIAGNOSIS — K743 Primary biliary cirrhosis: Secondary | ICD-10-CM | POA: Diagnosis not present

## 2022-07-29 ENCOUNTER — Telehealth: Payer: Self-pay | Admitting: Gastroenterology

## 2022-07-29 NOTE — Telephone Encounter (Signed)
Patient is overdue for routine labs for PBC. These were due in November 2023. He is also due for routine 6 month follow-up. Please try to reach patient to schedule follow-up. He can have labs completed now or wait until office visit.

## 2022-07-30 ENCOUNTER — Other Ambulatory Visit: Payer: Self-pay | Admitting: *Deleted

## 2022-07-30 NOTE — Telephone Encounter (Signed)
Noted. Mandy, please reach patient to schedule routine follow-up.

## 2022-07-30 NOTE — Telephone Encounter (Signed)
Spoke to pt, informed to have labs completed this week.

## 2022-07-31 NOTE — Telephone Encounter (Signed)
Noted  

## 2022-08-01 LAB — HEPATIC FUNCTION PANEL
AG Ratio: 1.6 (calc) (ref 1.0–2.5)
ALT: 33 U/L (ref 9–46)
AST: 25 U/L (ref 10–35)
Albumin: 4.6 g/dL (ref 3.6–5.1)
Alkaline phosphatase (APISO): 86 U/L (ref 35–144)
Bilirubin, Direct: 0.3 mg/dL — ABNORMAL HIGH (ref 0.0–0.2)
Globulin: 2.8 g/dL (calc) (ref 1.9–3.7)
Indirect Bilirubin: 0.7 mg/dL (calc) (ref 0.2–1.2)
Total Bilirubin: 1 mg/dL (ref 0.2–1.2)
Total Protein: 7.4 g/dL (ref 6.1–8.1)

## 2022-08-01 LAB — CBC WITH DIFFERENTIAL/PLATELET
Absolute Monocytes: 202 cells/uL (ref 200–950)
Basophils Absolute: 22 cells/uL (ref 0–200)
Basophils Relative: 0.5 %
Eosinophils Absolute: 82 cells/uL (ref 15–500)
Eosinophils Relative: 1.9 %
HCT: 42 % (ref 38.5–50.0)
Hemoglobin: 14.9 g/dL (ref 13.2–17.1)
Lymphs Abs: 1152 cells/uL (ref 850–3900)
MCH: 34.3 pg — ABNORMAL HIGH (ref 27.0–33.0)
MCHC: 35.5 g/dL (ref 32.0–36.0)
MCV: 96.6 fL (ref 80.0–100.0)
MPV: 10.4 fL (ref 7.5–12.5)
Monocytes Relative: 4.7 %
Neutro Abs: 2842 cells/uL (ref 1500–7800)
Neutrophils Relative %: 66.1 %
Platelets: 149 10*3/uL (ref 140–400)
RBC: 4.35 10*6/uL (ref 4.20–5.80)
RDW: 13.9 % (ref 11.0–15.0)
Total Lymphocyte: 26.8 %
WBC: 4.3 10*3/uL (ref 3.8–10.8)

## 2022-08-01 LAB — VITAMIN D 25 HYDROXY (VIT D DEFICIENCY, FRACTURES): Vit D, 25-Hydroxy: 52 ng/mL (ref 30–100)

## 2022-08-01 LAB — TSH: TSH: 3.12 mIU/L (ref 0.40–4.50)

## 2022-08-07 NOTE — Progress Notes (Unsigned)
Referring Provider: Ginger Mcmahon Primary Care Physician:  Jason Munch, PA-C Primary GI Physician: Dr. Gala Mcmahon  No chief complaint on file.   HPI:   Jason Mcmahon is a 57 y.o. male presenting today for follow-up with PBC and GERD.   He has history of heartburn, adenomatous colon polyps due for repeat colonoscopy in 2026, and history of PBC with elevated alk phos, positive AMA, and positive ASMA.  ANA negative.  Liver biopsy in December 2020 with findings compatible with PBC though not diagnostic.  Possible overlap autoimmune hepatitis or drug-induced liver injury also considered.  Also with mild fibrosis (stage I-2 of 4) he started on Urso 500 mg twice daily.  Liver enzymes normalized in May 2022.  RUQ ultrasound in January 2022 with normal-appearing liver, kPa 9.6.  Due to chronic mild thrombocytopenia and mildly elevated kPa at 9.6 in January 2022, there was some concern for early cirrhosis, so EGD completed 02/22/2021: Single 5 mm erosion straddling GE junction, otherwise normal esophagus, small hiatal hernia, otherwise normal exam.  Dr. Gala Mcmahon stated clinical progress including labs, imaging findings  should guide future endoscopy. He has completed hepatitis A and B vaccines. Found to have vitamin A and D deficiencies in 2022 and started Centrum 50+ multivitamin.    He has previously had some mild elevation of his bilirubin up to 1.5, but primarily indirect.  Case was reviewed with Roosevelt Locks, NP with [facility] who recommended continuing to monitor liver enzymes and bilirubin.  If direct bilirubin continued to rise, may need to consider Ocaliva, but suspect patient has component of Gilbert's syndrome as bilirubin was primarily indirect.   Most recent labs 07/31/2022: Platelets normal, LFTs normal, total bilirubin 1.0, direct bilirubin 0.3 (stable), indirect bilirubin 0.7.  Vitamin D within normal limits.  TSH within normal limits.  Most recent RUQ  ultrasound 07/23/2022: Hepatic steatosis.  Last ultrasound elastography in July 2023 with kPa 7.1.  He was last seen via virtual visit 01/08/2022.  He was doing well at that time.  Plan to update labs, imaging, continue ursodiol, continue vitamin D, and advised the patient could try tapering off of Prevacid and monitoring for recurrent reflux symptoms.  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  Repeat in 3 years.   COLONOSCOPY N/A 02/05/2020   Surgeon: Jason Dolin, MD;  Diverticulosis in the sigmoid and descending colon, otherwise normal exam, repeat in 5 years.   ESOPHAGOGASTRODUODENOSCOPY N/A 02/22/2021   Surgeon: Jason Dolin, MD;  Single 5 mm erosion straddling GE junction, otherwise normal esophagus, small hiatal hernia, otherwise normal exam.    Current Outpatient Medications  Medication Sig Dispense Refill   allopurinol (ZYLOPRIM) 300 MG tablet Take 300 mg by mouth daily.     Cholecalciferol (VITAMIN D3) 50 MCG (2000 UT) capsule Take 2 capsules (4,000 Units total) by mouth daily. (Patient taking differently: Take 2,000 Units by mouth daily.) 60 capsule 2   irbesartan (AVAPRO) 150 MG  tablet Take 1 tablet (150 mg total) by mouth daily. 30 tablet 11   levETIRAcetam (KEPPRA XR) 500 MG 24 hr tablet Take 2 tablets (1,000 mg total) by mouth at bedtime. 180 tablet 3   lisinopril (ZESTRIL) 20 MG tablet Take 20 mg by mouth daily.     omeprazole (PRILOSEC) 40 MG capsule Take 1 capsule (40 mg total) by mouth daily before breakfast. 90 capsule 3   sildenafil (VIAGRA) 25 MG tablet Take 25-100 mg by  mouth daily as needed for erectile dysfunction.     ursodiol (ACTIGALL) 500 MG tablet TAKE 1 TABLET(500 MG) BY MOUTH TWICE DAILY 180 tablet 3   No current facility-administered medications for this visit.    Allergies as of 08/08/2022 - Review Complete 01/08/2022  Allergen Reaction Noted   Aspirin Anaphylaxis 10/11/2020   Lisinopril Cough 07/19/2021    Family History  Problem Relation Age of Onset   Liver disease Father        Alcoholic   Hypertension Father    Cancer Father    Lung cancer Father    Diabetes Paternal Grandmother    Seizures Paternal Aunt    Colon cancer Neg Hx     Social History   Socioeconomic History   Marital status: Married    Spouse name: Not on file   Number of children: Not on file   Years of education: Not on file   Highest education level: Not on file  Occupational History   Not on file  Tobacco Use   Smoking status: Former    Packs/day: 3.00    Years: 18.00    Total pack years: 54.00    Types: Cigarettes   Smokeless tobacco: Never  Vaping Use   Vaping Use: Never used  Substance and Sexual Activity   Alcohol use: No   Drug use: No   Sexual activity: Not on file  Other Topics Concern   Not on file  Social History Narrative   Not on file   Social Determinants of Health   Financial Resource Strain: Not on file  Food Insecurity: Not on file  Transportation Needs: Not on file  Physical Activity: Not on file  Stress: Not on file  Social Connections: Not on file    Review of Systems: Gen: Denies fever, chills, anorexia. Denies fatigue, weakness, weight loss.  CV: Denies chest pain, palpitations, syncope, peripheral edema, and claudication. Resp: Denies dyspnea at rest, cough, wheezing, coughing up blood, and pleurisy. GI: Denies vomiting blood, jaundice, and fecal incontinence.   Denies dysphagia or odynophagia. Derm: Denies rash, itching, dry skin Psych: Denies depression, anxiety, memory loss, confusion. No homicidal or suicidal  ideation.  Heme: Denies bruising, bleeding, and enlarged lymph nodes.  Physical Exam: There were no vitals taken for this visit. General:   Alert and oriented. No distress noted. Pleasant and cooperative.  Head:  Normocephalic and atraumatic. Eyes:  Conjuctiva clear without scleral icterus. Heart:  S1, S2 present without murmurs appreciated. Lungs:  Clear to auscultation bilaterally. No wheezes, rales, or rhonchi. No distress.  Abdomen:  +BS, soft, non-tender and non-distended. No rebound or guarding. No HSM or masses noted. Msk:  Symmetrical without gross deformities. Normal posture. Extremities:  Without edema. Neurologic:  Alert and  oriented x4 Psych:  Normal mood and affect.    Assessment:     Plan:  ***   Aliene Altes, PA-C [facility] 08/08/2022

## 2022-08-08 ENCOUNTER — Encounter: Payer: Self-pay | Admitting: Gastroenterology

## 2022-08-08 ENCOUNTER — Ambulatory Visit (INDEPENDENT_AMBULATORY_CARE_PROVIDER_SITE_OTHER): Payer: 59 | Admitting: Gastroenterology

## 2022-08-08 VITALS — BP 134/74 | HR 76 | Temp 97.7°F | Ht 68.5 in | Wt 196.4 lb

## 2022-08-08 DIAGNOSIS — R09A2 Foreign body sensation, throat: Secondary | ICD-10-CM

## 2022-08-08 DIAGNOSIS — K219 Gastro-esophageal reflux disease without esophagitis: Secondary | ICD-10-CM

## 2022-08-08 DIAGNOSIS — K743 Primary biliary cirrhosis: Secondary | ICD-10-CM

## 2022-08-08 NOTE — Patient Instructions (Addendum)
Continue ursodiol 500 mg twice daily.  As requested, we will hold off on your repeat ultrasound in 6 months and plan for a 1 year repeat. I do recommend that you call your insurance company to see if it would be any cheaper to have your ultrasound at an outpatient imaging center rather than in the hospital.  Continue your vitamin D.  Continue omeprazole 20 mg twice daily 30 minutes and continue to monitor improvement in globus sensation in your throat.  If you have persistent symptoms after about 4 more weeks, please let me know, and we can adjust your medication.  Follow a GERD diet:  Avoid fried, fatty, greasy, spicy, citrus foods. Avoid caffeine and carbonated beverages. Avoid chocolate. Try eating 4-6 small meals a day rather than 3 large meals. Do not eat within 3 hours of laying down. Prop head of bed up on wood or bricks to create a 6 inch incline.   We will plan to follow-up in 3 months.  If you decide that you would like to follow-up in 6 months instead, please let me know.  It was great to see you again today!  Hope you have a great time on your upcoming trips!  Aliene Altes, PA-C [facility]

## 2022-08-16 ENCOUNTER — Other Ambulatory Visit: Payer: Self-pay | Admitting: Gastroenterology

## 2022-08-16 DIAGNOSIS — K743 Primary biliary cirrhosis: Secondary | ICD-10-CM

## 2022-08-20 ENCOUNTER — Other Ambulatory Visit: Payer: Self-pay | Admitting: *Deleted

## 2022-08-20 MED ORDER — LEVETIRACETAM ER 500 MG PO TB24: 1000.0000 mg | ORAL_TABLET | Freq: Every day | ORAL | 1 refills | Status: DC

## 2022-08-20 NOTE — Telephone Encounter (Signed)
Pt last seen on 10/12/2021 Follow up scheduled on 10/17/22

## 2022-10-16 NOTE — Progress Notes (Unsigned)
No chief complaint on file.   HISTORY OF PRESENT ILLNESS:  10/16/22 ALL:  Jason Mcmahon returns for follow up for seizures. He continues levetiracetam XR  daily.   10/12/2021 ALL: Jason Mcmahon is a 57 y.o. male here today for follow up for seizures. He continues levetiracetam XR  daily. He is tolerating medication well without obvious adverse effects. Last seizure about a year ago. He has had two seizures (2020 and 2022), [place] He has [place] multiple times since without any seizure activity. He is driving without difficulty. He works full time.   He does not wish to resume CPAP. He feels that he sleeps well. He reports getting about 9 hours every night. He continues close follow up with hepatology for PBC. Labs are stable. He continues ursodiol  BID. He maintains healthy lifestyle habits. He does not drink alcohol. He does not smoke. He lives an active lifestyle.   04/12/2021 SA: Jason Mcmahon is a 57 year old right-hand gentleman with an underlying medical history of hypertension, gout, and overweight state, who presents for follow-up consultation of his seizure disorder.  He is unaccompanied today (wife brought him, she is in the parking lot with their grandbaby).  I last saw him on 12/06/2020, at which time he reported sleeping well.  We talked about his home sleep test results from 11/29/2020 indicating mild to near moderate obstructive sleep apnea by number of events with an AHI of 14.6/h, O2 nadir 88%.  He was advised to consider treatment with AutoPap.  He declined treatment at the time.  He was advised to continue with his Keppra for seizure prevention.  He indicated no recent seizures.   Today, 04/12/2021: He reports doing well, no recent seizures, no seizure-like episodes, no new complaints, no neurological symptoms, trying to hydrate well, has been drinking more water and has lost a little bit of weight, working on weight loss.  He has been working  full-time in the office, he makes computer chips.  He had some accommodation from his boss and was able to work from home on Thursdays.  He generally works Sundays through Thursdays every week.  He is hoping to resume driving as of today, as he will be 6 months out from his last seizure.  He denies any recurrent headaches or difficulty tolerating the Keppra, reports full compliance.  He has been sleeping fairly well.  He has regular checkup with his liver specialist.  He had a change in his GI specialist as his original doctor left the practice.  He had an ultrasound and blood work recently.  Ultrasound of the abdomen on 01/04/2021 showed:IMPRESSION: Probable fatty infiltration of liver as above.   Splenomegaly, not significantly changed.   Remainder of exam unremarkable.   He does not drink any alcohol, he limits his caffeine.  07/13/19 ALL:  Jason Mcmahon is a 57 y.o. male here today for follow up for seizure. He had a sigle, unprovoked seizure on 01/17/2019. Workup has been unremarkable. He was diagnosed with primary biliary cholangitis following seizure. He has worked on healthy lifestyle habits. He has stopped drinking sodas. He limits red meats to once every two weeks. He is eating more fish. He continues to work full time. He denies any seizure like activity since last being seen. He was never started on AED's. He is feeling well today. He does note elevated BP in the setting of white coat syndrome. He reports that BP is checked regularly at home and  usually 130's/80's. He takes lisinopril  at bedtime.   REVIEW OF SYSTEMS: Out of a complete 14 system review of symptoms, the patient complains only of the following symptoms, none and all other reviewed systems are negative.   ALLERGIES: Allergies  Allergen Reactions   Aspirin Anaphylaxis   Lisinopril Cough     HOME MEDICATIONS: Outpatient Medications Prior to Visit  Medication Sig Dispense Refill   allopurinol (ZYLOPRIM) 300 MG  tablet Take 300 mg by mouth daily.     Cholecalciferol (VITAMIN D3) 50 MCG (2000 UT) capsule Take 2 capsules (4,000 Units total) by mouth daily. (Patient taking differently: Take 2,000 Units by mouth daily.) 60 capsule 2   irbesartan (AVAPRO) 150 MG tablet Take 1 tablet (150 mg total) by mouth daily. 30 tablet 11   levETIRAcetam (KEPPRA XR) 500 MG 24 hr tablet Take 2 tablets (1,000 mg total) by mouth at bedtime. 180 tablet 1   omeprazole (PRILOSEC) 40 MG capsule Take 1 capsule (40 mg total) by mouth daily before breakfast. (Patient taking differently: Take 40 mg by mouth daily before breakfast. Taking 20 mg twice daily.) 90 capsule 3   sildenafil (VIAGRA) 25 MG tablet Take 25-100 mg by mouth daily as needed for erectile dysfunction.     ursodiol (ACTIGALL) 500 MG tablet TAKE 1 TABLET(500 MG) BY MOUTH TWICE DAILY 180 tablet 3   No facility-administered medications prior to visit.     PAST MEDICAL HISTORY: Past Medical History:  Diagnosis Date   Acid reflux    Gout    Hypertension    Primary biliary cholangitis ([facility])    Seizures ([facility])    started on Keppra in April 2022     PAST SURGICAL HISTORY: Past Surgical History:  Procedure Laterality Date   arm surgery     COLONOSCOPY N/A 02/28/2016   Surgeon: Franky Macho, MD; 2 mm tubular adenoma in the ascending colon, 8 mm tubular adenoma in the sigmoid colon, otherwise normal exam.  Repeat in 3 years.   COLONOSCOPY N/A 02/05/2020   Surgeon: Corbin Ade, MD;  Diverticulosis in the sigmoid and descending colon, otherwise normal exam, repeat in 5 years.   ESOPHAGOGASTRODUODENOSCOPY N/A 02/22/2021   Surgeon: Corbin Ade, MD;  Single 5 mm erosion straddling GE junction, otherwise normal esophagus, small hiatal hernia, otherwise normal exam.     FAMILY HISTORY: Family History  Problem Relation Age of Onset   Liver disease Father        Alcoholic   Hypertension Father    Cancer Father    Lung cancer Father    Diabetes Paternal  Grandmother    Seizures Paternal Aunt    Colon cancer Neg Hx      SOCIAL HISTORY: Social History   Socioeconomic History   Marital status: Married    Spouse name: Not on file   Number of children: Not on file   Years of education: Not on file   Highest education level: Not on file  Occupational History   Not on file  Tobacco Use   Smoking status: Former    Packs/day: 3.00    Years: 18.00    Additional pack years: 0.00    Total pack years: 54.00    Types: Cigarettes   Smokeless tobacco: Never  Vaping Use   Vaping Use: Never used  Substance and Sexual Activity   Alcohol use: No   Drug use: No   Sexual activity: Not on file  Other Topics Concern   Not on file  Social History Narrative   Not on file   Social Determinants of Health   Financial Resource Strain: Not on file  Food Insecurity: Not on file  Transportation Needs: Not on file  Physical Activity: Not on file  Stress: Not on file  Social Connections: Not on file  Intimate Partner Violence: Not on file     PHYSICAL EXAM  There were no vitals filed for this visit.  There is no height or weight on file to calculate BMI.  Generalized: Well developed, in no acute distress  Cardiology: normal rate and rhythm, no murmur auscultated  Respiratory: clear to auscultation bilaterally    Neurological examination  Mentation: Alert oriented to time, place, history taking. Follows all commands speech and language fluent Cranial nerve II-XII: Pupils were equal round reactive to light. Extraocular movements were full, visual field were full on confrontational test. Facial sensation and strength were normal. Head turning and shoulder shrug  were normal and symmetric. Motor: The motor testing reveals 5 over 5 strength of all 4 extremities. Good symmetric motor tone is noted throughout.  Gait and station: Gait is normal.   DIAGNOSTIC DATA (LABS, IMAGING, TESTING) - I reviewed patient records, labs, notes, testing and  imaging myself where available.  Lab Results  Component Value Date   WBC 4.3 07/31/2022   HGB 14.9 07/31/2022   HCT 42.0 07/31/2022   MCV 96.6 07/31/2022   PLT 149 07/31/2022      Component Value Date/Time   NA 135 06/25/2021 0925   NA 145 (H) 11/01/2020 1422   K 3.6 06/25/2021 0925   CL 103 06/25/2021 0925   CO2 20 (L) 06/25/2021 0925   GLUCOSE 99 06/25/2021 0925   BUN 12 06/25/2021 0925   BUN 16 11/01/2020 1422   CREATININE 1.20 06/25/2021 1640   CREATININE 1.31 07/07/2020 1648   CALCIUM 9.2 06/25/2021 0925   PROT 7.4 07/31/2022 1533   PROT 7.2 10/31/2021 1640   ALBUMIN 4.6 10/31/2021 1640   AST 25 07/31/2022 1533   ALT 33 07/31/2022 1533   ALKPHOS 104 10/31/2021 1640   BILITOT 1.0 07/31/2022 1533   BILITOT 1.0 10/31/2021 1640   GFRNONAA >60 06/25/2021 1640   GFRAA >60 06/04/2019 0741   No results found for: "CHOL", "HDL", "LDLCALC", "LDLDIRECT", "TRIG", "CHOLHDL" Lab Results  Component Value Date   HGBA1C 5.0 06/25/2012   No results found for: "VITAMINB12" Lab Results  Component Value Date   TSH 3.12 07/31/2022        No data to display               No data to display           ASSESSMENT AND PLAN  57 y.o. year old male  has a past medical history of Acid reflux, Gout, Hypertension, Primary biliary cholangitis ([facility]), and Seizures ([facility]). here with    No diagnosis found.  Jason FRAIZER is doing very well, today. No seizure activity since early 2022. We will continue levetiracetam XR 1000mg  QHS. We have reviewed possible triggers and advised avoidance if possible. We have reviewed sleep study results. He is adamant that he is not interested in CPAP therapy. We have reviewed options of oral appliance versus Inspire. He will call me if he changes his mind. He will continue close follow up with care team. Healthy lifestyle habits encouraged. He will return to see me in 1 year, sooner if needed. He verbalizes understanding and agreement with this  plan.  No orders of the defined types were placed in this encounter.    No orders of the defined types were placed in this encounter.    Shawnie Dapper, MSN, FNP-C 10/16/2022, 4:17 PM  [facility] [address], [address] [place], [place] [address] [phone]

## 2022-10-16 NOTE — Patient Instructions (Signed)
Below is our plan:  We will continue levetiracetam XR  daily   Please make sure you are consistent with timing of seizure medication. I recommend annual visit with primary care provider (PCP) for complete physical and routine blood work. I recommend daily intake of vitamin D (400-800iu) and calcium (800-1000mg ) for bone health. Discuss Dexa screening with PCP.   According to [place] law, you can not drive unless you are seizure / syncope free for at least 6 months and under physician's care.  Please maintain precautions. Do not participate in activities where a loss of awareness could harm you or someone else. No swimming alone, no tub bathing, no hot tubs, no driving, no operating motorized vehicles (cars, ATVs, motocycles, etc), lawnmowers, power tools or firearms. No standing at heights, such as rooftops, ladders or stairs. Avoid hot objects such as stoves, heaters, open fires. Wear a helmet when riding a bicycle, scooter, skateboard, etc. and avoid areas of traffic. Set your water heater to 120 degrees or less.  Please make sure you are staying well hydrated. I recommend 50-60 ounces daily. Well balanced diet and regular exercise encouraged. Consistent sleep schedule with 6-8 hours recommended.   Please continue follow up with care team as directed.   Follow up with me in 1 year   You may receive a survey regarding today's visit. I encourage you to leave honest feed back as I do use this information to improve patient care. Thank you for seeing me today!

## 2022-10-17 ENCOUNTER — Ambulatory Visit (INDEPENDENT_AMBULATORY_CARE_PROVIDER_SITE_OTHER): Payer: 59 | Admitting: Family Medicine

## 2022-10-17 ENCOUNTER — Encounter: Payer: Self-pay | Admitting: Family Medicine

## 2022-10-17 VITALS — BP 163/94 | HR 83 | Ht 68.0 in | Wt 194.8 lb

## 2022-10-17 DIAGNOSIS — G4733 Obstructive sleep apnea (adult) (pediatric): Secondary | ICD-10-CM | POA: Diagnosis not present

## 2022-10-17 DIAGNOSIS — K743 Primary biliary cirrhosis: Secondary | ICD-10-CM

## 2022-10-17 DIAGNOSIS — G40909 Epilepsy, unspecified, not intractable, without status epilepticus: Secondary | ICD-10-CM

## 2022-10-17 MED ORDER — LEVETIRACETAM ER 500 MG PO TB24: 1000.0000 mg | ORAL_TABLET | Freq: Every day | ORAL | 3 refills | Status: DC

## 2022-11-04 NOTE — Progress Notes (Signed)
Referring Provider: Samuella Bruin Primary Care Physician:  Elder Negus, NP Primary GI Physician: Dr. Jena Gauss  Chief Complaint  Patient presents with   Follow-up    Follow up. No problems     HPI:   Jason Mcmahon is a 57 y.o. male  presenting today for follow-up on GERD and globus.    He has history of heartburn, adenomatous colon polyps due for repeat colonoscopy in 2026, and history of PBC.     Last seen in our office 08/08/2022.  No symptoms of decompensated liver disease.  He requested decreased screening ultrasounds to yearly due to cost.  Also requesting to hold off on DEXA scans for now.  Reported new onset globus sensation a couple months ago, waking in the middle the night with sensation of something choking him.  No typical heartburn symptoms.  He was on Prevacid 30 mg daily, but ENT switched him to omeprazole 20 mg twice daily and he had noticed some improvement.  Reported he had been traveling quite a bit, eating more fast foods, drinking more soda.  No alarm symptoms.  Recommended continuing current medications, avoid fried foods, carbonated beverages, and if no improvement in globus sensation in about 4 weeks, can consider increasing omeprazole.  Next RUQ ultrasound January 2025 per patient's request.  Plan to follow-up in 3 months due to globus sensation.  Today:  Feeling well overall. Prior globus sensation resolved and GERD is well controlled on omeprazole 20 mg daily. No nausea, vomiting, abdominal pain, abdominal bloating, LE edema, jaundice, mental status changes, bruising/bleeding, constipation, diarrhea, brbpr, melena, or unintentional weight loss.   Spoke with insurance who recommended [place] and DEXA to be completed at outpatient imaging center as this should be cheaper. He would like to start with [place] to see how affordable it is.   Past Medical History:  Diagnosis Date   Acid reflux    Gout    Hypertension    Primary biliary cholangitis (HCC)     Seizures (HCC)    started on Keppra in April 2022    Past Surgical History:  Procedure Laterality Date   arm surgery     COLONOSCOPY N/A 02/28/2016   Surgeon: Franky Macho, MD; 2 mm tubular adenoma in the ascending colon, 8 mm tubular adenoma in the sigmoid colon, otherwise normal exam.  Repeat in 3 years.   COLONOSCOPY N/A 02/05/2020   Surgeon: Corbin Ade, MD;  Diverticulosis in the sigmoid and descending colon, otherwise normal exam, repeat in 5 years.   ESOPHAGOGASTRODUODENOSCOPY N/A 02/22/2021   Surgeon: Corbin Ade, MD;  Single 5 mm erosion straddling GE junction, otherwise normal esophagus, small hiatal hernia, otherwise normal exam.    Current Outpatient Medications  Medication Sig Dispense Refill   allopurinol (ZYLOPRIM) 300 MG tablet Take 300 mg by mouth daily.     Cholecalciferol (VITAMIN D3) 50 MCG (2000 UT) capsule Take 2 capsules (4,000 Units total) by mouth daily. (Patient taking differently: Take 2,000 Units by mouth daily.) 60 capsule 2   irbesartan (AVAPRO) 150 MG tablet Take 1 tablet (150 mg total) by mouth daily. 30 tablet 11   levETIRAcetam (KEPPRA XR) 500 MG 24 hr tablet Take 2 tablets (1,000 mg total) by mouth at bedtime. 180 tablet 3   omeprazole (PRILOSEC) 20 MG capsule Take 20 mg by mouth daily.     sildenafil (VIAGRA) 25 MG tablet Take 25-100 mg by mouth daily as needed for erectile dysfunction.     ursodiol (  ACTIGALL) 500 MG tablet TAKE 1 TABLET(500 MG) BY MOUTH TWICE DAILY 180 tablet 3   No current facility-administered medications for this visit.    Allergies as of 11/07/2022 - Review Complete 11/07/2022  Allergen Reaction Noted   Aspirin Anaphylaxis 10/11/2020   Lisinopril Cough 07/19/2021    Family History  Problem Relation Age of Onset   Liver disease Father        Alcoholic   Hypertension Father    Cancer Father    Lung cancer Father    Diabetes Paternal Grandmother    Seizures Paternal Aunt    Colon cancer Neg Hx     Social  History   Socioeconomic History   Marital status: Married    Spouse name: Not on file   Number of children: Not on file   Years of education: Not on file   Highest education level: Not on file  Occupational History   Not on file  Tobacco Use   Smoking status: Former    Packs/day: 3.00    Years: 18.00    Additional pack years: 0.00    Total pack years: 54.00    Types: Cigarettes   Smokeless tobacco: Never  Vaping Use   Vaping Use: Never used  Substance and Sexual Activity   Alcohol use: No   Drug use: No   Sexual activity: Not on file  Other Topics Concern   Not on file  Social History Narrative   Not on file   Social Determinants of Health   Financial Resource Strain: Not on file  Food Insecurity: Not on file  Transportation Needs: Not on file  Physical Activity: Not on file  Stress: Not on file  Social Connections: Not on file    Review of Systems: Gen: Denies fever, chills, cold or flu like symptoms, pre-syncope, or syncope.   CV: Denies chest pain, palpitations.. Resp: Denies dyspnea, cough. GI: See HPI. Heme: See HPI  Physical Exam: BP (!) 147/85 (BP Location: Left Arm, Patient Position: Sitting, Cuff Size: Large)   Pulse 76   Temp 98 F (36.7 C) (Temporal)   Ht 5' 8.5" (1.74 m)   Wt 194 lb 6.4 oz (88.2 kg)   SpO2 93%   BMI 29.13 kg/m  General:   Alert and oriented. No distress noted. Pleasant and cooperative.  Head:  Normocephalic and atraumatic. Eyes:  Conjuctiva clear without scleral icterus. Heart:  S1, S2 present without murmurs appreciated. Lungs:  Clear to auscultation bilaterally. No wheezes, rales, or rhonchi. No distress.  Abdomen:  +BS, soft, non-tender and non-distended. No rebound or guarding. No HSM or masses noted. Msk:  Symmetrical without gross deformities. Normal posture. Extremities:  Without edema. Neurologic:  Alert and  oriented x4 Psych:  Normal mood and affect.    Assessment:  57 year old male presenting today for  follow-up of GERD, globus, PBC.  GERD/globus: Resolved after changing Prevacid to omeprazole 20 mg daily.  No alarm symptoms.  PBC: AMA and ASMA positive.  Underwent liver biopsy in 2020 with findings compatible with PBC though not diagnostic, possible overlap autoimmune hepatitis or drug-induced liver injury also considered.   Maintained on ursodiol 500 mg twice daily.  Clinically, doing very well.  No alcohol intake.  Liver enzymes normalized May 2022 with most recent labs 07/31/2022.  Due to chronic mild thrombocytopenia and mildly elevated kPa at 9.6 in January 2022, there was some concern for early cirrhosis and EGD was pursued in August 2022, but did not eveal any varices or  portal hypertensive gastropathy.  Dr. Jena Gauss recommended clinical progress including labs and imaging findings should guide future endoscopy. His thrombocytopenia resolved in December 2022.  His ultrasound is up-to-date in January 2024 with hepatic steatosis.  Patient previously requested to decrease yearly ultrasounds due to cost, but he spoke with his insurance who recommended outpatient imaging center.  He is also due for DEXA scan this year, previously normal in July 2022.  He will consider having this completed as well if cost is reasonable at outpatient imaging center, but prefers to start with ultrasound first.   He does continue on vitamin D supplementation with vitamin D within normal limits in January.  TSH also normal in January.     Plan:  RUQ ultrasound around January 31, 2023. CBC, HFP around January 31, 2023. Continue ursodiol 500 mg twice daily. Continue omeprazole 20 mg daily. Follow-up in 6 months or sooner if needed.   Ermalinda Memos, PA-C [facility] 11/07/2022

## 2022-11-05 ENCOUNTER — Encounter: Payer: Self-pay | Admitting: *Deleted

## 2022-11-07 ENCOUNTER — Ambulatory Visit (INDEPENDENT_AMBULATORY_CARE_PROVIDER_SITE_OTHER): Payer: 59 | Admitting: Gastroenterology

## 2022-11-07 ENCOUNTER — Encounter: Payer: Self-pay | Admitting: Gastroenterology

## 2022-11-07 VITALS — BP 147/85 | HR 76 | Temp 98.0°F | Ht 68.5 in | Wt 194.4 lb

## 2022-11-07 DIAGNOSIS — K743 Primary biliary cirrhosis: Secondary | ICD-10-CM | POA: Diagnosis not present

## 2022-11-07 DIAGNOSIS — K219 Gastro-esophageal reflux disease without esophagitis: Secondary | ICD-10-CM | POA: Diagnosis not present

## 2022-11-07 DIAGNOSIS — R09A2 Foreign body sensation, throat: Secondary | ICD-10-CM | POA: Diagnosis not present

## 2022-11-07 NOTE — Patient Instructions (Signed)
We will arrange you to have an ultrasound in August at [facility] imaging center.  We will arrange you to have blood work done around 1 August.  Continue ursodiol 500 mg twice daily.  Continue omeprazole 20 mg daily.  Will follow-up with you in 6 months or sooner if needed.  If you need to contact me prior to your follow-up visit, do not hesitate to do so.  You can call our office and try to reach my nurse Toni Amend, or you can send a MyChart message.  It was great to see you again today!  Ermalinda Memos, PA-C [facility]

## 2022-11-10 ENCOUNTER — Encounter: Payer: Self-pay | Admitting: Gastroenterology

## 2023-01-15 ENCOUNTER — Other Ambulatory Visit: Payer: Self-pay

## 2023-01-15 ENCOUNTER — Telehealth: Payer: Self-pay

## 2023-01-15 DIAGNOSIS — K743 Primary biliary cirrhosis: Secondary | ICD-10-CM

## 2023-01-15 NOTE — Telephone Encounter (Signed)
Pt is needing to be scheduled for an ultrasound around August 1 per Baxter Hire, see last ov note.

## 2023-01-15 NOTE — Addendum Note (Signed)
Addended by: Armstead Peaks on: 01/15/2023 09:30 AM   Modules accepted: Orders

## 2023-01-15 NOTE — Telephone Encounter (Signed)
Order placed for [place] imaging center per OV note.

## 2023-01-28 ENCOUNTER — Other Ambulatory Visit: Payer: Self-pay | Admitting: Gastroenterology

## 2023-01-28 ENCOUNTER — Encounter: Payer: Self-pay | Admitting: *Deleted

## 2023-01-29 ENCOUNTER — Ambulatory Visit
Admission: RE | Admit: 2023-01-29 | Discharge: 2023-01-29 | Disposition: A | Discharge status: Home / Self Care | Payer: 59 | Source: Ambulatory Visit | Attending: Gastroenterology | Admitting: Gastroenterology

## 2023-01-29 DIAGNOSIS — K743 Primary biliary cirrhosis: Secondary | ICD-10-CM

## 2023-02-11 ENCOUNTER — Other Ambulatory Visit: Payer: Self-pay | Admitting: Gastroenterology

## 2023-02-12 ENCOUNTER — Other Ambulatory Visit: Payer: Self-pay

## 2023-02-12 MED ORDER — LEVETIRACETAM ER 500 MG PO TB24: 1000.0000 mg | ORAL_TABLET | Freq: Every day | ORAL | 3 refills | Status: DC

## 2023-02-26 ENCOUNTER — Telehealth: Payer: Self-pay | Admitting: Family Medicine

## 2023-02-26 NOTE — Telephone Encounter (Signed)
LVM and sent mychart msg informing pt of need to reschedule 10/17/23 appt - NP out

## 2023-04-12 ENCOUNTER — Encounter: Payer: Self-pay | Admitting: Gastroenterology

## 2023-05-20 ENCOUNTER — Observation Stay (HOSPITAL_BASED_OUTPATIENT_CLINIC_OR_DEPARTMENT_OTHER): Payer: 59

## 2023-05-20 ENCOUNTER — Observation Stay (HOSPITAL_COMMUNITY)
Admission: EM | Admit: 2023-05-20 | Discharge: 2023-05-21 | Disposition: A | Discharge status: Home / Self Care | Payer: 59 | Attending: Family Medicine | Admitting: Family Medicine

## 2023-05-20 ENCOUNTER — Other Ambulatory Visit: Payer: Self-pay

## 2023-05-20 ENCOUNTER — Encounter (HOSPITAL_COMMUNITY): Payer: Self-pay

## 2023-05-20 ENCOUNTER — Other Ambulatory Visit (HOSPITAL_COMMUNITY): Payer: Self-pay | Admitting: *Deleted

## 2023-05-20 ENCOUNTER — Emergency Department (HOSPITAL_COMMUNITY): Payer: 59

## 2023-05-20 ENCOUNTER — Observation Stay (HOSPITAL_COMMUNITY): Payer: 59

## 2023-05-20 DIAGNOSIS — D72819 Decreased white blood cell count, unspecified: Secondary | ICD-10-CM | POA: Insufficient documentation

## 2023-05-20 DIAGNOSIS — N182 Chronic kidney disease, stage 2 (mild): Secondary | ICD-10-CM | POA: Diagnosis not present

## 2023-05-20 DIAGNOSIS — G40909 Epilepsy, unspecified, not intractable, without status epilepticus: Secondary | ICD-10-CM

## 2023-05-20 DIAGNOSIS — Z79899 Other long term (current) drug therapy: Secondary | ICD-10-CM | POA: Diagnosis not present

## 2023-05-20 DIAGNOSIS — M109 Gout, unspecified: Secondary | ICD-10-CM | POA: Insufficient documentation

## 2023-05-20 DIAGNOSIS — J449 Chronic obstructive pulmonary disease, unspecified: Secondary | ICD-10-CM | POA: Diagnosis not present

## 2023-05-20 DIAGNOSIS — I129 Hypertensive chronic kidney disease with stage 1 through stage 4 chronic kidney disease, or unspecified chronic kidney disease: Secondary | ICD-10-CM | POA: Diagnosis not present

## 2023-05-20 DIAGNOSIS — Z87891 Personal history of nicotine dependence: Secondary | ICD-10-CM | POA: Insufficient documentation

## 2023-05-20 DIAGNOSIS — Z8669 Personal history of other diseases of the nervous system and sense organs: Secondary | ICD-10-CM | POA: Insufficient documentation

## 2023-05-20 DIAGNOSIS — I639 Cerebral infarction, unspecified: Principal | ICD-10-CM | POA: Diagnosis present

## 2023-05-20 DIAGNOSIS — K219 Gastro-esophageal reflux disease without esophagitis: Secondary | ICD-10-CM | POA: Insufficient documentation

## 2023-05-20 DIAGNOSIS — Z7901 Long term (current) use of anticoagulants: Secondary | ICD-10-CM | POA: Insufficient documentation

## 2023-05-20 DIAGNOSIS — I10 Essential (primary) hypertension: Secondary | ICD-10-CM | POA: Diagnosis present

## 2023-05-20 DIAGNOSIS — I6389 Other cerebral infarction: Secondary | ICD-10-CM | POA: Diagnosis not present

## 2023-05-20 DIAGNOSIS — I6381 Other cerebral infarction due to occlusion or stenosis of small artery: Secondary | ICD-10-CM | POA: Diagnosis present

## 2023-05-20 LAB — COMPREHENSIVE METABOLIC PANEL
ALT: 43 U/L (ref 0–44)
AST: 34 U/L (ref 15–41)
Albumin: 4 g/dL (ref 3.5–5.0)
Alkaline Phosphatase: 95 U/L (ref 38–126)
Anion gap: 7 (ref 5–15)
BUN: 17 mg/dL (ref 6–20)
CO2: 27 mmol/L (ref 22–32)
Calcium: 9.3 mg/dL (ref 8.9–10.3)
Chloride: 104 mmol/L (ref 98–111)
Creatinine, Ser: 1.28 mg/dL — ABNORMAL HIGH (ref 0.61–1.24)
GFR, Estimated: >60 mL/min (ref >60)
Glucose, Bld: 91 mg/dL (ref 70–99)
Potassium: 3.9 mmol/L (ref 3.5–5.1)
Sodium: 138 mmol/L (ref 135–145)
Total Bilirubin: 1.3 mg/dL — ABNORMAL HIGH (ref <1.2)
Total Protein: 7.2 g/dL (ref 6.5–8.1)

## 2023-05-20 LAB — URINALYSIS, ROUTINE W REFLEX MICROSCOPIC
Bilirubin Urine: NEGATIVE
Glucose, UA: NEGATIVE mg/dL
Hgb urine dipstick: NEGATIVE
Ketones, ur: NEGATIVE mg/dL
Leukocytes,Ua: NEGATIVE
Nitrite: NEGATIVE
Protein, ur: NEGATIVE mg/dL
Specific Gravity, Urine: 1.005 (ref 1.005–1.030)
pH: 6 (ref 5.0–8.0)

## 2023-05-20 LAB — DIFFERENTIAL
Abs Immature Granulocytes: 0.02 10*3/uL (ref 0.00–0.07)
Basophils Absolute: 0 10*3/uL (ref 0.0–0.1)
Basophils Relative: 1 %
Eosinophils Absolute: 0 10*3/uL (ref 0.0–0.5)
Eosinophils Relative: 0 %
Immature Granulocytes: 1 %
Lymphocytes Relative: 31 %
Lymphs Abs: 1.2 10*3/uL (ref 0.7–4.0)
Monocytes Absolute: 0.3 10*3/uL (ref 0.1–1.0)
Monocytes Relative: 8 %
Neutro Abs: 2.3 10*3/uL (ref 1.7–7.7)
Neutrophils Relative %: 59 %

## 2023-05-20 LAB — I-STAT CHEM 8, ED
BUN: 16 mg/dL (ref 6–20)
Calcium, Ion: 1.24 mmol/L (ref 1.15–1.40)
Chloride: 104 mmol/L (ref 98–111)
Creatinine, Ser: 1.3 mg/dL — ABNORMAL HIGH (ref 0.61–1.24)
Glucose, Bld: 87 mg/dL (ref 70–99)
HCT: 43 % (ref 39.0–52.0)
Hemoglobin: 14.6 g/dL (ref 13.0–17.0)
Potassium: 4 mmol/L (ref 3.5–5.1)
Sodium: 140 mmol/L (ref 135–145)
TCO2: 26 mmol/L (ref 22–32)

## 2023-05-20 LAB — LIPID PANEL
Cholesterol: 138 mg/dL (ref 0–200)
HDL: 27 mg/dL — ABNORMAL LOW (ref >40)
LDL Cholesterol: 86 mg/dL (ref 0–99)
Total CHOL/HDL Ratio: 5.1 {ratio}
Triglycerides: 125 mg/dL (ref <150)
VLDL: 25 mg/dL (ref 0–40)

## 2023-05-20 LAB — CBC
HCT: 43 % (ref 39.0–52.0)
Hemoglobin: 14.8 g/dL (ref 13.0–17.0)
MCH: 33.6 pg (ref 26.0–34.0)
MCHC: 34.4 g/dL (ref 30.0–36.0)
MCV: 97.7 fL (ref 80.0–100.0)
Platelets: 146 10*3/uL — ABNORMAL LOW (ref 150–400)
RBC: 4.4 MIL/uL (ref 4.22–5.81)
RDW: 14 % (ref 11.5–15.5)
WBC: 3.8 10*3/uL — ABNORMAL LOW (ref 4.0–10.5)
nRBC: 0 % (ref 0.0–0.2)

## 2023-05-20 LAB — HEMOGLOBIN A1C
Hgb A1c MFr Bld: 5 % (ref 4.8–5.6)
Mean Plasma Glucose: 96.8 mg/dL

## 2023-05-20 LAB — ECHOCARDIOGRAM COMPLETE
Area-P 1/2: 2.91 cm2
Est EF: >75
Height: 68 in
S' Lateral: 2.15 cm
Weight: 3136 [oz_av]

## 2023-05-20 LAB — RAPID URINE DRUG SCREEN, HOSP PERFORMED
Amphetamines: NOT DETECTED
Barbiturates: NOT DETECTED
Benzodiazepines: NOT DETECTED
Cocaine: NOT DETECTED
Opiates: NOT DETECTED
Tetrahydrocannabinol: NOT DETECTED

## 2023-05-20 LAB — PROTIME-INR
INR: 1.1 (ref 0.8–1.2)
Prothrombin Time: 13.9 s (ref 11.4–15.2)

## 2023-05-20 LAB — CBG MONITORING, ED: Glucose-Capillary: 88 mg/dL (ref 70–99)

## 2023-05-20 LAB — ETHANOL: Alcohol, Ethyl (B): <10 mg/dL (ref <10)

## 2023-05-20 MED ORDER — SODIUM CHLORIDE 0.9% FLUSH
3.0000 mL | Freq: Two times a day (BID) | INTRAVENOUS | Status: DC
  Administered 2023-05-20 – 2023-05-21 (×3): 3 mL via INTRAVENOUS

## 2023-05-20 MED ORDER — ACETAMINOPHEN 650 MG RE SUPP: 650.0000 mg | Freq: Four times a day (QID) | RECTAL | Status: DC | PRN

## 2023-05-20 MED ORDER — ATORVASTATIN CALCIUM 40 MG PO TABS
40.0000 mg | ORAL_TABLET | Freq: Every day | ORAL | Status: DC
  Administered 2023-05-20 – 2023-05-21 (×2): 40 mg via ORAL
  Filled 2023-05-20 (×2): qty 1

## 2023-05-20 MED ORDER — ACETAMINOPHEN 325 MG PO TABS: 650.0000 mg | ORAL_TABLET | Freq: Four times a day (QID) | ORAL | Status: DC | PRN

## 2023-05-20 MED ORDER — SODIUM CHLORIDE 0.9% FLUSH
3.0000 mL | Freq: Two times a day (BID) | INTRAVENOUS | Status: DC
  Administered 2023-05-20 – 2023-05-21 (×2): 3 mL via INTRAVENOUS

## 2023-05-20 MED ORDER — LEVETIRACETAM ER 500 MG PO TB24
1000.0000 mg | ORAL_TABLET | Freq: Every day | ORAL | Status: DC
  Administered 2023-05-20: 1000 mg via ORAL
  Filled 2023-05-20 (×2): qty 2

## 2023-05-20 MED ORDER — CLOPIDOGREL BISULFATE 75 MG PO TABS
75.0000 mg | ORAL_TABLET | Freq: Every day | ORAL | Status: DC
  Administered 2023-05-21: 75 mg via ORAL
  Filled 2023-05-20 (×2): qty 1

## 2023-05-20 MED ORDER — SODIUM CHLORIDE 0.9% FLUSH: 3.0000 mL | INTRAVENOUS | Status: DC | PRN

## 2023-05-20 MED ORDER — CLOPIDOGREL BISULFATE 75 MG PO TABS
300.0000 mg | ORAL_TABLET | Freq: Once | ORAL | Status: AC
  Administered 2023-05-20: 300 mg via ORAL
  Filled 2023-05-20: qty 4

## 2023-05-20 MED ORDER — ONDANSETRON HCL 4 MG/2ML IJ SOLN: 4.0000 mg | Freq: Four times a day (QID) | INTRAMUSCULAR | Status: DC | PRN

## 2023-05-20 MED ORDER — SODIUM CHLORIDE 0.9 % IV SOLN: INTRAVENOUS | Status: AC | PRN

## 2023-05-20 MED ORDER — URSODIOL 300 MG PO CAPS
600.0000 mg | ORAL_CAPSULE | Freq: Three times a day (TID) | ORAL | Status: DC
  Administered 2023-05-20 – 2023-05-21 (×3): 600 mg via ORAL
  Filled 2023-05-20 (×6): qty 2

## 2023-05-20 MED ORDER — ONDANSETRON HCL 4 MG PO TABS: 4.0000 mg | ORAL_TABLET | Freq: Four times a day (QID) | ORAL | Status: DC | PRN

## 2023-05-20 MED ORDER — BISACODYL 10 MG RE SUPP: 10.0000 mg | Freq: Every day | RECTAL | Status: DC | PRN

## 2023-05-20 MED ORDER — POLYETHYLENE GLYCOL 3350 17 G PO PACK: 17.0000 g | PACK | Freq: Every day | ORAL | Status: DC | PRN

## 2023-05-20 MED ORDER — PERFLUTREN LIPID MICROSPHERE
1.0000 mL | INTRAVENOUS | Status: AC | PRN
  Administered 2023-05-20: 3 mL via INTRAVENOUS

## 2023-05-20 MED ORDER — ALLOPURINOL 100 MG PO TABS
300.0000 mg | ORAL_TABLET | Freq: Every day | ORAL | Status: DC
Start: 2023-05-21 — End: 2023-05-21
  Administered 2023-05-21: 300 mg via ORAL
  Filled 2023-05-20: qty 3

## 2023-05-20 MED ORDER — PANTOPRAZOLE SODIUM 40 MG PO TBEC
40.0000 mg | DELAYED_RELEASE_TABLET | Freq: Every day | ORAL | Status: DC
  Administered 2023-05-20 – 2023-05-21 (×2): 40 mg via ORAL
  Filled 2023-05-20 (×2): qty 1

## 2023-05-20 MED ORDER — HYDRALAZINE HCL 20 MG/ML IJ SOLN
10.0000 mg | Freq: Four times a day (QID) | INTRAMUSCULAR | Status: DC | PRN
  Administered 2023-05-20: 10 mg via INTRAVENOUS
  Filled 2023-05-20: qty 1

## 2023-05-20 MED ORDER — ENOXAPARIN SODIUM 40 MG/0.4ML IJ SOSY
40.0000 mg | PREFILLED_SYRINGE | INTRAMUSCULAR | Status: DC
  Administered 2023-05-20: 40 mg via SUBCUTANEOUS
  Filled 2023-05-20: qty 0.4

## 2023-05-20 NOTE — ED Notes (Signed)
Patient transported to CT 

## 2023-05-20 NOTE — ED Triage Notes (Addendum)
Pt c/o rt side facial numbness and hand starting when he woke up at 0430 this morning. Pt states going to bed completely normal at 2100. EDP at bedside during triage.

## 2023-05-20 NOTE — Plan of Care (Signed)
  Problem: Activity: Goal: Risk for activity intolerance will decrease Outcome: Progressing   Problem: Coping: Goal: Level of anxiety will decrease Outcome: Progressing   Problem: Pain Management: Goal: General experience of comfort will improve Outcome: Progressing

## 2023-05-20 NOTE — H&P (Addendum)
Patient Demographics:    Jason Mcmahon, is a 57 y.o. male  MRN: [id number]   DOB - Dec 04, 1965  Admit Date - 05/20/2023  Outpatient Primary MD for the patient is Elder Negus, NP   Assessment & Plan:   Assessment and Plan:  1)Acute Lacunar Infarct in the left thalamus- -right upper extremity numbness as well as right-sided facial numbness  -MRI brain and MRA head with acute lacunar infarct in the left thalamus, there is atrophy and chronic small vessel disease, patient had 50% right ICA and high-grade right A1 stenosis. --Carotid artery Dopplers without hemodynamically significant stenosis -Echo with EF greater than 75% with hyperdynamic left ventricle, grade 1 diastolic dysfunction noted, no evidence of mitral stenosis, no aortic stenosis -EKG is normal sinus rhythm, INR 1.1 A1C 5.0 LDL 86, HDL 27, T chol 138, Trig 125 Allow Permissive Hypertension---only treat if BP > 210/110 mmhg -Neurology input appreciated Give Plavix and Lipid.. (reports Aspirin intolerance----Gi) If No arhythmia on tele, may need 30 day event monitor   2)HTN---Allow Permissive Hypertension -Hold Avapro May use IV Hydralazine 10 mg  Every 4 hours Prn for systolic blood pressure over 210 mmhg  3)CKD Stage - 2 Creatinine 1.30 on 04/17/2023 Creatinine was 1.48 on 09/17/2022 - renally adjust medications, avoid nephrotoxic agents / dehydration  / hypotension  4)Gout--- no flare up, c/n allopurinol  5)History of Seizures---No recent seizures, c/n Keppra  6)Leukopenia and Thrombocytopenia----?? etiology  Dispo: The patient is from: Home              Anticipated d/c is to: Home              Anticipated d/c date is: 1 day              Patient currently is not medically stable to d/c. Barriers: Not Clinically Stable-   With History of  - Reviewed by me  Past Medical History:  Diagnosis Date   Acid reflux    Gout    Hypertension    Primary biliary cholangitis (HCC)    Seizures (HCC)    started on Keppra in April 2022      Past Surgical History:  Procedure Laterality Date   arm surgery     COLONOSCOPY N/A 02/28/2016   Surgeon: Franky Macho, MD; 2 mm tubular adenoma in the ascending colon, 8 mm tubular adenoma in the sigmoid colon, otherwise normal exam.  Repeat in 3 years.   COLONOSCOPY N/A 02/05/2020   Surgeon: Corbin Ade, MD;  Diverticulosis in the sigmoid and descending colon, otherwise normal exam, repeat in 5 years.   ESOPHAGOGASTRODUODENOSCOPY N/A 02/22/2021   Surgeon: Corbin Ade, MD;  Single 5 mm erosion straddling GE junction, otherwise normal esophagus, small hiatal hernia, otherwise normal exam.   Chief Complaint  Patient presents with   Numbness      HPI:    Jason Mcmahon  is a 57 y.o. male reformed smoker (54 pack years), COPD,  HTN GERD and history of seizures-who presented to the ED with complaints of right upper extremity numbness as well as right-sided facial numbness when he woke up this morning (around 4:30 PM -Last known well around 9 PM yesterday when he went to bed No fever  Or chills  No chest pains no palpitations no dizziness, no speech problems -No headaches no visual disturbance No Nausea, Vomiting or Diarrhea -CBC with a white count of 3.8 platelets 146 hemoglobin 14.8 -Creatinine 1.28, UA and UDS unremarkable -LFTs are not elevated except for total bilirubin of 1.3 -EKG is normal sinus rhythm -Carotid artery Dopplers without hemodynamically significant stenosis -MRI brain and MRA head with acute lacunar infarct in the left thalamus, there is atrophy and chronic small vessel disease, patient had 50% right ICA and high-grade right A1 stenosis -Echo with EF greater than 75% with hyperdynamic left ventricle, grade 1 diastolic dysfunction noted, no evidence of mitral stenosis,  no aortic stenosis -Neurology input appreciated   Review of systems:    In addition to the HPI above,   A full Review of  Systems was done, all other systems reviewed are negative except as noted above in HPI , .    Social History:  Reviewed by me   Social History   Tobacco Use   Smoking status: Former    Current packs/day: 3.00    Average packs/day: 3.0 packs/day for 18.0 years (54.0 ttl pk-yrs)    Types: Cigarettes   Smokeless tobacco: Never  Substance Use Topics   Alcohol use: No    Family History :  Reviewed by me    Family History  Problem Relation Age of Onset   Liver disease Father        Alcoholic   Hypertension Father    Cancer Father    Lung cancer Father    Diabetes Paternal Grandmother    Seizures Paternal Aunt    Colon cancer Neg Hx     Home Medications:   Prior to Admission medications   Medication Sig Start Date End Date Taking? Authorizing Provider  allopurinol (ZYLOPRIM) 300 MG tablet Take 300 mg by mouth daily. 09/07/19  Yes [provider]  Cholecalciferol (VITAMIN D3) 50 MCG (2000 UT) capsule Take 2 capsules (4,000 Units total) by mouth daily. Patient taking differently: Take 2,000 Units by mouth daily. 08/04/21  Yes Letta Median, PA-C  irbesartan (AVAPRO) 150 MG tablet Take 1 tablet (150 mg total) by mouth daily. 06/29/21  Yes Nyoka Cowden, MD  levETIRAcetam (KEPPRA XR) 500 MG 24 hr tablet Take 2 tablets (1,000 mg total) by mouth at bedtime. 02/12/23  Yes Lomax, Amy, NP  omeprazole (PRILOSEC) 20 MG capsule TAKE 1 CAPSULE(20 MG) BY MOUTH TWICE DAILY BEFORE MEALS Patient taking differently: Take 20 mg by mouth 2 (two) times daily before a meal. 02/11/23  Yes Rourk, Gerrit Friends, MD  sildenafil (VIAGRA) 25 MG tablet Take 25-100 mg by mouth daily as needed for erectile dysfunction. 01/25/21  Yes [provider]  ursodiol (ACTIGALL) 500 MG tablet TAKE 1 TABLET(500 MG) BY MOUTH TWICE DAILY Patient taking differently: Take 500 mg by  mouth 3 (three) times daily. 08/16/22  Yes Rourk, Gerrit Friends, MD  febuxostat (ULORIC) 40 MG tablet Take by mouth. Patient not taking: Reported on 05/20/2023 06/13/16   [provider]     Allergies:     Allergies  Allergen Reactions   Aspirin Anaphylaxis   Lisinopril Cough     Physical Exam:  Vitals  Blood pressure (!) 177/108, pulse 87, temperature 98.1 F (36.7 C), temperature source Oral, resp. rate 18, height 5\' 8"  (1.727 m), weight 88.9 kg, SpO2 95%.  Physical Examination: General appearance - alert,  in no distress  Mental status - alert, oriented to person, place, and time,  Eyes - sclera anicteric Neck - supple, no JVD elevation , Chest - clear  to auscultation bilaterally, symmetrical air movement,  Heart - S1 and S2 normal, regular  Abdomen - soft, nontender, nondistended, +BS Neurological - screening mental status exam normal, neck supple without rigidity, -right upper extremity numbness improving, right facial numbness improving (Sensation --> 1 = Mild-moderate loss: less sharp/more dull ) Extremities - no pedal edema noted, intact peripheral pulses  Skin - warm, dry    Data Review:    CBC Recent Labs  Lab 05/20/23 0818 05/20/23 0831  WBC 3.8*  --   HGB 14.8 14.6  HCT 43.0 43.0  PLT 146*  --   MCV 97.7  --   MCH 33.6  --   MCHC 34.4  --   RDW 14.0  --   LYMPHSABS 1.2  --   MONOABS 0.3  --   EOSABS 0.0  --   BASOSABS 0.0  --    ------------------------------------------------------------------------------------------------------------------  Chemistries  Recent Labs  Lab 05/20/23 0818 05/20/23 0831  NA 138 140  K 3.9 4.0  CL 104 104  CO2 27  --   GLUCOSE 91 87  BUN 17 16  CREATININE 1.28* 1.30*  CALCIUM 9.3  --   AST 34  --   ALT 43  --   ALKPHOS 95  --   BILITOT 1.3*  --    ------------------------------------------------------------------------------------------------------------------ estimated creatinine clearance is 67.1  mL/min (A) (by C-G formula based on SCr of 1.3 mg/dL (H)). ------------------------------------------------------------------------------------------------------------------ Coagulation profile Recent Labs  Lab 05/20/23 0818  INR 1.1   Urinalysis    Component Value Date/Time   COLORURINE STRAW (A) 05/20/2023 0719   APPEARANCEUR CLEAR 05/20/2023 0719   LABSPEC 1.005 05/20/2023 0719   PHURINE 6.0 05/20/2023 0719   GLUCOSEU NEGATIVE 05/20/2023 0719   HGBUR NEGATIVE 05/20/2023 0719   BILIRUBINUR NEGATIVE 05/20/2023 0719   KETONESUR NEGATIVE 05/20/2023 0719   PROTEINUR NEGATIVE 05/20/2023 0719   UROBILINOGEN 1.0 06/24/2012 2245   NITRITE NEGATIVE 05/20/2023 0719   LEUKOCYTESUR NEGATIVE 05/20/2023 0719     Imaging Results:    US Carotid Bilateral  Result Date: 05/20/2023 CLINICAL DATA:  Stroke Hypertension Former tobacco user EXAM: BILATERAL CAROTID DUPLEX ULTRASOUND TECHNIQUE: Wallace Cullens scale imaging, color Doppler and duplex ultrasound were performed of bilateral carotid and vertebral arteries in the neck. COMPARISON:  None available FINDINGS: Criteria: Quantification of carotid stenosis is based on velocity parameters that correlate the residual internal carotid diameter with NASCET-based stenosis levels, using the diameter of the distal internal carotid lumen as the denominator for stenosis measurement. The following velocity measurements were obtained: RIGHT ICA: 103/27 cm/sec CCA: 95/15 cm/sec SYSTOLIC ICA/CCA RATIO:  1.1 ECA: 108 cm/sec LEFT ICA: 93/27 cm/sec CCA: 103/22 cm/sec SYSTOLIC ICA/CCA RATIO:  0.9 ECA: 110 cm/sec RIGHT CAROTID ARTERY: Mild focal calcified plaque of the right ICA origin. RIGHT VERTEBRAL ARTERY:  Antegrade flow. LEFT CAROTID ARTERY:  Mild calcified plaque of the left ICA origin. LEFT VERTEBRAL ARTERY:  Antegrade flow. IMPRESSION: Mild calcified plaque of the internal carotid artery origins with Doppler measurements consistent with less than 50% stenosis.  Electronically Signed   By: Acquanetta Belling M.D.   On:  05/20/2023 16:04   ECHOCARDIOGRAM COMPLETE  Result Date: 05/20/2023    ECHOCARDIOGRAM REPORT   Patient Name:   Jason Mcmahon Date of Exam: 05/20/2023 Medical Rec #:  [id number]       Height:       68.0 in Accession #:    [id number]      Weight:       196.0 lb Date of Birth:  15-Oct-1965       BSA:          2.026 m Patient Age:    57 years        BP:           188/108 mmHg Patient Gender: M               HR:           70 bpm. [facility] Procedure: 2D Echo, Cardiac Doppler, Color Doppler, Saline Contrast Bubble Study            and Intracardiac Opacification Agent Indications:    Stroke l63.9  History:        Patient has no prior history of Echocardiogram examinations.                 COPD, Arrythmias:Tachycardia; Risk Factors:Hypertension.  Sonographer:    Celesta Gentile RCS Referring Phys: [phone] Saabir Blyth IMPRESSIONS  1. Left ventricular ejection fraction, by estimation, is >75%. The left ventricle has hyperdynamic function. The left ventricle has no regional wall motion abnormalities. There is moderate asymmetric left ventricular hypertrophy of the septal segment. Left ventricular diastolic parameters are consistent with Grade I diastolic dysfunction (impaired relaxation). Elevated left ventricular end-diastolic pressure.  2. Right ventricular systolic function is normal. The right ventricular size is normal. Tricuspid regurgitation signal is inadequate for assessing PA pressure.  3. The mitral valve is abnormal. No evidence of mitral valve regurgitation. No evidence of mitral stenosis. Moderate mitral annular calcification.  4. The aortic valve is tricuspid. There is mild calcification of the aortic valve. Aortic valve regurgitation is not visualized. No aortic stenosis is present.  5. The inferior vena cava is normal in size with greater than 50% respiratory variability, suggesting right atrial pressure of 3 mmHg.  6. Agitated saline  contrast bubble study was negative, with no evidence of any interatrial shunt. Comparison(s): No prior Echocardiogram. FINDINGS  Left Ventricle: Left ventricular ejection fraction, by estimation, is >75%. The left ventricle has hyperdynamic function. The left ventricle has no regional wall motion abnormalities. Definity contrast agent was given IV to delineate the left ventricular endocardial borders. The left ventricular internal cavity size was normal in size. There is moderate asymmetric left ventricular hypertrophy of the septal segment. Left ventricular diastolic parameters are consistent with Grade I diastolic dysfunction (impaired relaxation). Elevated left ventricular end-diastolic pressure. Right Ventricle: The right ventricular size is normal. No increase in right ventricular wall thickness. Right ventricular systolic function is normal. Tricuspid regurgitation signal is inadequate for assessing PA pressure. Left Atrium: Left atrial size was normal in size. Right Atrium: Right atrial size was normal in size. Pericardium: There is no evidence of pericardial effusion. Mitral Valve: The mitral valve is abnormal. Moderate mitral annular calcification. No evidence of mitral valve regurgitation. No evidence of mitral valve stenosis. Tricuspid Valve: The tricuspid valve is not well visualized. Tricuspid valve regurgitation is not demonstrated. No evidence of tricuspid stenosis. Aortic Valve: The aortic valve is tricuspid. There is mild calcification of the aortic valve. Aortic valve  regurgitation is not visualized. No aortic stenosis is present. Pulmonic Valve: The pulmonic valve was not well visualized. Pulmonic valve regurgitation is not visualized. No evidence of pulmonic stenosis. Aorta: The aortic root is normal in size and structure. Venous: The inferior vena cava is normal in size with greater than 50% respiratory variability, suggesting right atrial pressure of 3 mmHg. IAS/Shunts: No atrial level shunt  detected by color flow Doppler. Agitated saline contrast was given intravenously to evaluate for intracardiac shunting. Agitated saline contrast bubble study was negative, with no evidence of any interatrial shunt.  LEFT VENTRICLE PLAX 2D LVIDd:         4.00 cm   Diastology LVIDs:         2.15 cm   LV e' medial:    4.35 cm/s LV PW:         1.30 cm   LV E/e' medial:  17.8 LV IVS:        1.35 cm   LV e' lateral:   5.55 cm/s LVOT diam:     2.30 cm   LV E/e' lateral: 14.0 LV SV:         91 LV SV Index:   45 LVOT Area:     4.15 cm  RIGHT VENTRICLE RV S prime:     12.00 cm/s TAPSE (M-mode): 2.0 cm LEFT ATRIUM           Index LA diam:      3.73 cm 1.84 cm/m LA Vol (A4C): 38.9 ml 19.20 ml/m  AORTIC VALVE LVOT Vmax:   88.30 cm/s LVOT Vmean:  68.000 cm/s LVOT VTI:    0.220 m  AORTA Ao Root diam: 3.60 cm MITRAL VALVE MV Area (PHT): 2.91 cm    SHUNTS MV Decel Time: 261 msec    Systemic VTI:  0.22 m MV E velocity: 77.50 cm/s  Systemic Diam: 2.30 cm MV A velocity: 98.20 cm/s MV E/A ratio:  0.79 Vishnu Priya Mallipeddi Electronically signed by Winfield Rast Mallipeddi Signature Date/Time: 05/20/2023/3:42:46 PM    Final    MR BRAIN WO CONTRAST  Result Date: 05/20/2023 CLINICAL DATA:  Neuro deficit with acute stroke suspected. Right arm and face numbness based on prior head CT. EXAM: MRI HEAD WITHOUT CONTRAST MRA HEAD WITHOUT CONTRAST TECHNIQUE: Multiplanar, multi-echo pulse sequences of the brain and surrounding structures were acquired without intravenous contrast. Angiographic images of the Circle of Willis were acquired using MRA technique without intravenous contrast. COMPARISON:  Head CT from earlier today and brain MRI 02/05/2019 FINDINGS: MRI HEAD FINDINGS Brain: Small lacunar infarct at the lateral left thalamus, convincing when considering the reported deficit. No acute hemorrhage, obstructive hydrocephalus, mass, or collection. There is chronic small vessel ischemia in the cerebral white matter with cerebral  volume loss that is generalized. Disproportionate subarachnoid spaces and some callosum angle narrowing with corpus callosum upward bulging but no ventriculomegaly to the degree that communicating hydrocephalus is usually entertained. Vascular: Normal flow voids. Skull and upper cervical spine: Normal marrow signal. Sinuses/Orbits: No acute or significant finding. MRA HEAD FINDINGS Anterior circulation: Atheromatous type irregularity of the carotid siphons and intracranial branches. ~ 50% narrowing of the right ICA at the posterior genu. Hypoplastic right A1 segment with origin stenosis that appears advanced. No aneurysm or major branch occlusion. Posterior circulation: Vertebral and basilar arteries are widely patent. The left vertebral artery is dominant. Atheromatous irregularity of branch vessels including a moderate upper division left PCA stenosis. Negative for aneurysm. IMPRESSION: Brain MRI: 1. Acute lacunar  infarct in the left thalamus. 2. Atrophy and chronic small vessel disease. MRA: Intracranial atherosclerosis including 50% right ICA and high-grade right A1 stenoses. Electronically Signed   By: Tiburcio Pea M.D.   On: 05/20/2023 10:08   MR ANGIO HEAD WO CONTRAST  Result Date: 05/20/2023 CLINICAL DATA:  Neuro deficit with acute stroke suspected. Right arm and face numbness based on prior head CT. EXAM: MRI HEAD WITHOUT CONTRAST MRA HEAD WITHOUT CONTRAST TECHNIQUE: Multiplanar, multi-echo pulse sequences of the brain and surrounding structures were acquired without intravenous contrast. Angiographic images of the Circle of Willis were acquired using MRA technique without intravenous contrast. COMPARISON:  Head CT from earlier today and brain MRI 02/05/2019 FINDINGS: MRI HEAD FINDINGS Brain: Small lacunar infarct at the lateral left thalamus, convincing when considering the reported deficit. No acute hemorrhage, obstructive hydrocephalus, mass, or collection. There is chronic small vessel ischemia  in the cerebral white matter with cerebral volume loss that is generalized. Disproportionate subarachnoid spaces and some callosum angle narrowing with corpus callosum upward bulging but no ventriculomegaly to the degree that communicating hydrocephalus is usually entertained. Vascular: Normal flow voids. Skull and upper cervical spine: Normal marrow signal. Sinuses/Orbits: No acute or significant finding. MRA HEAD FINDINGS Anterior circulation: Atheromatous type irregularity of the carotid siphons and intracranial branches. ~ 50% narrowing of the right ICA at the posterior genu. Hypoplastic right A1 segment with origin stenosis that appears advanced. No aneurysm or major branch occlusion. Posterior circulation: Vertebral and basilar arteries are widely patent. The left vertebral artery is dominant. Atheromatous irregularity of branch vessels including a moderate upper division left PCA stenosis. Negative for aneurysm. IMPRESSION: Brain MRI: 1. Acute lacunar infarct in the left thalamus. 2. Atrophy and chronic small vessel disease. MRA: Intracranial atherosclerosis including 50% right ICA and high-grade right A1 stenoses. Electronically Signed   By: Tiburcio Pea M.D.   On: 05/20/2023 10:08   CT HEAD WO CONTRAST  Result Date: 05/20/2023 CLINICAL DATA:  Right arm and face numbness. EXAM: CT HEAD WITHOUT CONTRAST TECHNIQUE: Contiguous axial images were obtained from the base of the skull through the vertex without intravenous contrast. RADIATION DOSE REDUCTION: This exam was performed according to the departmental dose-optimization program which includes automated exposure control, adjustment of the mA and/or kV according to patient size and/or use of iterative reconstruction technique. COMPARISON:  Brain MRI 02/05/2019 FINDINGS: Brain: No evidence of acute infarction, hemorrhage, hydrocephalus, extra-axial collection or mass lesion/mass effect. Callosum angle narrowing and disproportionate subarachnoid spaces  but no pronounced ventriculomegaly typical of communicating hydrocephalus. Mild periventricular low-density. Cerebral volume loss with mild progression from 2020 Vascular: No hyperdense vessel or unexpected calcification. Skull: Normal. Negative for fracture or focal lesion. Sinuses/Orbits: No acute finding. IMPRESSION: 1. No acute finding. 2. Brain atrophy and chronic small vessel disease that is mildly progressed since 2020 Electronically Signed   By: Tiburcio Pea M.D.   On: 05/20/2023 08:16    Radiological Exams on Admission: US Carotid Bilateral  Result Date: 05/20/2023 CLINICAL DATA:  Stroke Hypertension Former tobacco user EXAM: BILATERAL CAROTID DUPLEX ULTRASOUND TECHNIQUE: Wallace Cullens scale imaging, color Doppler and duplex ultrasound were performed of bilateral carotid and vertebral arteries in the neck. COMPARISON:  None available FINDINGS: Criteria: Quantification of carotid stenosis is based on velocity parameters that correlate the residual internal carotid diameter with NASCET-based stenosis levels, using the diameter of the distal internal carotid lumen as the denominator for stenosis measurement. The following velocity measurements were obtained: RIGHT ICA: 103/27 cm/sec CCA: 95/15 cm/sec  SYSTOLIC ICA/CCA RATIO:  1.1 ECA: 108 cm/sec LEFT ICA: 93/27 cm/sec CCA: 103/22 cm/sec SYSTOLIC ICA/CCA RATIO:  0.9 ECA: 110 cm/sec RIGHT CAROTID ARTERY: Mild focal calcified plaque of the right ICA origin. RIGHT VERTEBRAL ARTERY:  Antegrade flow. LEFT CAROTID ARTERY:  Mild calcified plaque of the left ICA origin. LEFT VERTEBRAL ARTERY:  Antegrade flow. IMPRESSION: Mild calcified plaque of the internal carotid artery origins with Doppler measurements consistent with less than 50% stenosis. Electronically Signed   By: Acquanetta Belling M.D.   On: 05/20/2023 16:04   ECHOCARDIOGRAM COMPLETE  Result Date: 05/20/2023    ECHOCARDIOGRAM REPORT   Patient Name:   Jason Mcmahon Date of Exam: 05/20/2023 Medical Rec #:   [id number]       Height:       68.0 in Accession #:    [id number]      Weight:       196.0 lb Date of Birth:  Aug 08, 1965       BSA:          2.026 m Patient Age:    57 years        BP:           188/108 mmHg Patient Gender: M               HR:           70 bpm. [facility] Procedure: 2D Echo, Cardiac Doppler, Color Doppler, Saline Contrast Bubble Study            and Intracardiac Opacification Agent Indications:    Stroke l63.9  History:        Patient has no prior history of Echocardiogram examinations.                 COPD, Arrythmias:Tachycardia; Risk Factors:Hypertension.  Sonographer:    Celesta Gentile RCS Referring Phys: [phone] Monterius Rolf IMPRESSIONS  1. Left ventricular ejection fraction, by estimation, is >75%. The left ventricle has hyperdynamic function. The left ventricle has no regional wall motion abnormalities. There is moderate asymmetric left ventricular hypertrophy of the septal segment. Left ventricular diastolic parameters are consistent with Grade I diastolic dysfunction (impaired relaxation). Elevated left ventricular end-diastolic pressure.  2. Right ventricular systolic function is normal. The right ventricular size is normal. Tricuspid regurgitation signal is inadequate for assessing PA pressure.  3. The mitral valve is abnormal. No evidence of mitral valve regurgitation. No evidence of mitral stenosis. Moderate mitral annular calcification.  4. The aortic valve is tricuspid. There is mild calcification of the aortic valve. Aortic valve regurgitation is not visualized. No aortic stenosis is present.  5. The inferior vena cava is normal in size with greater than 50% respiratory variability, suggesting right atrial pressure of 3 mmHg.  6. Agitated saline contrast bubble study was negative, with no evidence of any interatrial shunt. Comparison(s): No prior Echocardiogram. FINDINGS  Left Ventricle: Left ventricular ejection fraction, by estimation, is >75%. The left ventricle  has hyperdynamic function. The left ventricle has no regional wall motion abnormalities. Definity contrast agent was given IV to delineate the left ventricular endocardial borders. The left ventricular internal cavity size was normal in size. There is moderate asymmetric left ventricular hypertrophy of the septal segment. Left ventricular diastolic parameters are consistent with Grade I diastolic dysfunction (impaired relaxation). Elevated left ventricular end-diastolic pressure. Right Ventricle: The right ventricular size is normal. No increase in right ventricular wall thickness. Right ventricular systolic function is normal. Tricuspid regurgitation signal is  inadequate for assessing PA pressure. Left Atrium: Left atrial size was normal in size. Right Atrium: Right atrial size was normal in size. Pericardium: There is no evidence of pericardial effusion. Mitral Valve: The mitral valve is abnormal. Moderate mitral annular calcification. No evidence of mitral valve regurgitation. No evidence of mitral valve stenosis. Tricuspid Valve: The tricuspid valve is not well visualized. Tricuspid valve regurgitation is not demonstrated. No evidence of tricuspid stenosis. Aortic Valve: The aortic valve is tricuspid. There is mild calcification of the aortic valve. Aortic valve regurgitation is not visualized. No aortic stenosis is present. Pulmonic Valve: The pulmonic valve was not well visualized. Pulmonic valve regurgitation is not visualized. No evidence of pulmonic stenosis. Aorta: The aortic root is normal in size and structure. Venous: The inferior vena cava is normal in size with greater than 50% respiratory variability, suggesting right atrial pressure of 3 mmHg. IAS/Shunts: No atrial level shunt detected by color flow Doppler. Agitated saline contrast was given intravenously to evaluate for intracardiac shunting. Agitated saline contrast bubble study was negative, with no evidence of any interatrial shunt.  LEFT  VENTRICLE PLAX 2D LVIDd:         4.00 cm   Diastology LVIDs:         2.15 cm   LV e' medial:    4.35 cm/s LV PW:         1.30 cm   LV E/e' medial:  17.8 LV IVS:        1.35 cm   LV e' lateral:   5.55 cm/s LVOT diam:     2.30 cm   LV E/e' lateral: 14.0 LV SV:         91 LV SV Index:   45 LVOT Area:     4.15 cm  RIGHT VENTRICLE RV S prime:     12.00 cm/s TAPSE (M-mode): 2.0 cm LEFT ATRIUM           Index LA diam:      3.73 cm 1.84 cm/m LA Vol (A4C): 38.9 ml 19.20 ml/m  AORTIC VALVE LVOT Vmax:   88.30 cm/s LVOT Vmean:  68.000 cm/s LVOT VTI:    0.220 m  AORTA Ao Root diam: 3.60 cm MITRAL VALVE MV Area (PHT): 2.91 cm    SHUNTS MV Decel Time: 261 msec    Systemic VTI:  0.22 m MV E velocity: 77.50 cm/s  Systemic Diam: 2.30 cm MV A velocity: 98.20 cm/s MV E/A ratio:  0.79 Vishnu Priya Mallipeddi Electronically signed by Winfield Rast Mallipeddi Signature Date/Time: 05/20/2023/3:42:46 PM    Final    MR BRAIN WO CONTRAST  Result Date: 05/20/2023 CLINICAL DATA:  Neuro deficit with acute stroke suspected. Right arm and face numbness based on prior head CT. EXAM: MRI HEAD WITHOUT CONTRAST MRA HEAD WITHOUT CONTRAST TECHNIQUE: Multiplanar, multi-echo pulse sequences of the brain and surrounding structures were acquired without intravenous contrast. Angiographic images of the Circle of Willis were acquired using MRA technique without intravenous contrast. COMPARISON:  Head CT from earlier today and brain MRI 02/05/2019 FINDINGS: MRI HEAD FINDINGS Brain: Small lacunar infarct at the lateral left thalamus, convincing when considering the reported deficit. No acute hemorrhage, obstructive hydrocephalus, mass, or collection. There is chronic small vessel ischemia in the cerebral white matter with cerebral volume loss that is generalized. Disproportionate subarachnoid spaces and some callosum angle narrowing with corpus callosum upward bulging but no ventriculomegaly to the degree that communicating hydrocephalus is usually  entertained. Vascular: Normal flow voids. Skull and  upper cervical spine: Normal marrow signal. Sinuses/Orbits: No acute or significant finding. MRA HEAD FINDINGS Anterior circulation: Atheromatous type irregularity of the carotid siphons and intracranial branches. ~ 50% narrowing of the right ICA at the posterior genu. Hypoplastic right A1 segment with origin stenosis that appears advanced. No aneurysm or major branch occlusion. Posterior circulation: Vertebral and basilar arteries are widely patent. The left vertebral artery is dominant. Atheromatous irregularity of branch vessels including a moderate upper division left PCA stenosis. Negative for aneurysm. IMPRESSION: Brain MRI: 1. Acute lacunar infarct in the left thalamus. 2. Atrophy and chronic small vessel disease. MRA: Intracranial atherosclerosis including 50% right ICA and high-grade right A1 stenoses. Electronically Signed   By: Tiburcio Pea M.D.   On: 05/20/2023 10:08   MR ANGIO HEAD WO CONTRAST  Result Date: 05/20/2023 CLINICAL DATA:  Neuro deficit with acute stroke suspected. Right arm and face numbness based on prior head CT. EXAM: MRI HEAD WITHOUT CONTRAST MRA HEAD WITHOUT CONTRAST TECHNIQUE: Multiplanar, multi-echo pulse sequences of the brain and surrounding structures were acquired without intravenous contrast. Angiographic images of the Circle of Willis were acquired using MRA technique without intravenous contrast. COMPARISON:  Head CT from earlier today and brain MRI 02/05/2019 FINDINGS: MRI HEAD FINDINGS Brain: Small lacunar infarct at the lateral left thalamus, convincing when considering the reported deficit. No acute hemorrhage, obstructive hydrocephalus, mass, or collection. There is chronic small vessel ischemia in the cerebral white matter with cerebral volume loss that is generalized. Disproportionate subarachnoid spaces and some callosum angle narrowing with corpus callosum upward bulging but no ventriculomegaly to the degree  that communicating hydrocephalus is usually entertained. Vascular: Normal flow voids. Skull and upper cervical spine: Normal marrow signal. Sinuses/Orbits: No acute or significant finding. MRA HEAD FINDINGS Anterior circulation: Atheromatous type irregularity of the carotid siphons and intracranial branches. ~ 50% narrowing of the right ICA at the posterior genu. Hypoplastic right A1 segment with origin stenosis that appears advanced. No aneurysm or major branch occlusion. Posterior circulation: Vertebral and basilar arteries are widely patent. The left vertebral artery is dominant. Atheromatous irregularity of branch vessels including a moderate upper division left PCA stenosis. Negative for aneurysm. IMPRESSION: Brain MRI: 1. Acute lacunar infarct in the left thalamus. 2. Atrophy and chronic small vessel disease. MRA: Intracranial atherosclerosis including 50% right ICA and high-grade right A1 stenoses. Electronically Signed   By: Tiburcio Pea M.D.   On: 05/20/2023 10:08   CT HEAD WO CONTRAST  Result Date: 05/20/2023 CLINICAL DATA:  Right arm and face numbness. EXAM: CT HEAD WITHOUT CONTRAST TECHNIQUE: Contiguous axial images were obtained from the base of the skull through the vertex without intravenous contrast. RADIATION DOSE REDUCTION: This exam was performed according to the departmental dose-optimization program which includes automated exposure control, adjustment of the mA and/or kV according to patient size and/or use of iterative reconstruction technique. COMPARISON:  Brain MRI 02/05/2019 FINDINGS: Brain: No evidence of acute infarction, hemorrhage, hydrocephalus, extra-axial collection or mass lesion/mass effect. Callosum angle narrowing and disproportionate subarachnoid spaces but no pronounced ventriculomegaly typical of communicating hydrocephalus. Mild periventricular low-density. Cerebral volume loss with mild progression from 2020 Vascular: No hyperdense vessel or unexpected calcification.  Skull: Normal. Negative for fracture or focal lesion. Sinuses/Orbits: No acute finding. IMPRESSION: 1. No acute finding. 2. Brain atrophy and chronic small vessel disease that is mildly progressed since 2020 Electronically Signed   By: Tiburcio Pea M.D.   On: 05/20/2023 08:16    DVT Prophylaxis -SCD /heparin AM Labs Ordered,  also please review Full Orders  Family Communication: Admission, patients condition and plan of care including tests being ordered have been discussed with the patient  who indicate understanding and agree with the plan   Condition -stable  Shon Hale M.D on 05/20/2023 at 5:20 PM Go to [email or web address] -  for contact info  [facility] - Office  [phone]

## 2023-05-20 NOTE — ED Notes (Signed)
Attempted to call report to [phone]. Charge to assign pt and nurse to call ED for report.

## 2023-05-20 NOTE — Consult Note (Signed)
I connected with  Jason Mcmahon on 05/20/23 by a video enabled telemedicine application and verified that I am speaking with the correct person using two identifiers.   I discussed the limitations of evaluation and management by telemedicine. The patient expressed understanding and agreed to proceed.  Location of patient: [facility] Location of physician: [facility]  Neurology Consultation Reason for Consult: Stroke Referring Physician: Dr. Shon Hale  CC: Facial numbness, hand numbness  History is obtained from: Patient, chart review  HPI: Jason Mcmahon is a 57 y.o. male with a past medical history of hypertension, seizures on Keppra who presented with numbness on the right side of face and hand.  Patient states he went to bed around 1 PM yesterday.  Woke up around 4-430 this morning and noticed that his right hand was numb. Later noticed that right side of his face was also numb.  His googled his symptoms and read that this could be due to stroke and therefore came to the emergency room.  Last known normal: 05/09/2023 around 9 PM Event happened at home No tPA at outside No thrombectomy neurological occlusion mRS 0  ROS: All other systems reviewed and negative except as noted in the HPI.   Past Medical History:  Diagnosis Date   Acid reflux    Gout    Hypertension    Primary biliary cholangitis (HCC)    Seizures (HCC)    started on Keppra in April 2022     Family History  Problem Relation Age of Onset   Liver disease Father        Alcoholic   Hypertension Father    Cancer Father    Lung cancer Father    Diabetes Paternal Grandmother    Seizures Paternal Aunt    Colon cancer Neg Hx     Social History:  reports that he has quit smoking. His smoking use included cigarettes. He has a 54 pack-year smoking history. He has never used smokeless tobacco. He reports that he does not drink alcohol and does not use drugs.  (Not in a hospital  admission)     Exam: Current vital signs: BP (!) 200/98   Pulse 68   Temp 97.7 F (36.5 C) (Oral)   Resp 17   Ht 5\' 8"  (1.727 m)   Wt 88.9 kg   SpO2 93%   BMI 29.80 kg/m  Vital signs in last 24 hours: Temp:  [97.7 F (36.5 C)] 97.7 F (36.5 C) (11/18 0930) Pulse Rate:  [60-75] 68 (11/18 1050) Resp:  [10-17] 17 (11/18 1050) BP: (174-200)/(94-119) 200/98 (11/18 1050) SpO2:  [92 %-95 %] 93 % (11/18 1050) Weight:  [88.9 kg] 88.9 kg (11/18 0717)   Physical Exam  Constitutional: Appears well-developed and well-nourished.  Psych: Affect appropriate to situation Neuro: AOx3, no aphasia, cranial nerves appear grossly intact except numbness on right side of face, antigravity send without drift in all 4 extremities, FTN intact bilaterally, decreased sensation to light touch in right hand  NIHSS 1  INPUTS: 1A: Level of consciousness --> 0 = Alert; keenly responsive 1B: Ask month and age --> 0 = Both questions right 1C: 'Blink eyes' & 'squeeze hands' --> 0 = Performs both tasks 2: Horizontal extraocular movements --> 0 = Normal 3: Visual fields --> 0 = No visual loss 4: Facial palsy --> 0 = Normal symmetry 5A: Left arm motor drift --> 0 = No drift for 10 seconds 5B: Right arm motor drift --> 0 =  No drift for 10 seconds 6A: Left leg motor drift --> 0 = No drift for 5 seconds 6B: Right leg motor drift --> 0 = No drift for 5 seconds 7: Limb Ataxia --> 0 = No ataxia 8: Sensation --> 1 = Mild-moderate loss: less sharp/more dull  9: Language/aphasia --> 0 = Normal; no aphasia 10: Dysarthria --> 0 = Normal 11: Extinction/inattention --> 0 = No abnormality   I have reviewed labs in epic and the results pertinent to this consultation are: CBC:  Recent Labs  Lab 05/20/23 0818 05/20/23 0831  WBC 3.8*  --   NEUTROABS 2.3  --   HGB 14.8 14.6  HCT 43.0 43.0  MCV 97.7  --   PLT 146*  --     Basic Metabolic Panel:  Lab Results  Component Value Date   NA 140 05/20/2023   K 4.0  05/20/2023   CO2 27 05/20/2023   GLUCOSE 87 05/20/2023   BUN 16 05/20/2023   CREATININE 1.30 (H) 05/20/2023   CALCIUM 9.3 05/20/2023   GFRNONAA >60 05/20/2023   GFRAA >60 06/04/2019   Lipid Panel: No results found for: "LDLCALC" HgbA1c:  Lab Results  Component Value Date   HGBA1C 5.0 06/25/2012   Urine Drug Screen:     Component Value Date/Time   LABOPIA NONE DETECTED 05/20/2023 0719   COCAINSCRNUR NONE DETECTED 05/20/2023 0719   LABBENZ NONE DETECTED 05/20/2023 0719   AMPHETMU NONE DETECTED 05/20/2023 0719   THCU NONE DETECTED 05/20/2023 0719   LABBARB NONE DETECTED 05/20/2023 0719    Alcohol Level     Component Value Date/Time   ETH <10 05/20/2023 0818     I have reviewed the images obtained:  MRI Brain without contrast and MRA head without contrast 05/20/2023: Acute lacunar infarct in the left thalamus. Atrophy and chronic small vessel disease. Intracranial atherosclerosis including 50% right ICA and high-grade right A1 stenoses.     ASSESSMENT/PLAN: 57 year old male with acute onset of right face and hand numbness, left thalamic stroke on MRI.  Acute ischemic stroke -Etiology: Likely small vessel disease  Recommendations: -Recommend Plavix 75 mg daily and atorvastatin 40 mg daily.  Reports allergy to aspirin (causes stomach ache and nausea) -TTE and carotid ultrasound ordered and pending.  If no, recommend 30-day cardiac monitor to look for paroxysmal A-fib -Goal blood pressure: Permissive hypertension (only treat if BP > 220/120 unless a lower blood pressure is clinically necessary) for 24-48 hours post stroke onset -PT/OT/speech consult -n.p.o. until passes swallow screen -Discussed BEFAST, stroke education -Follow-up with [place] [facility] in 2 to 3 months (order placed -Discussed plan with Dr. Mariea Clonts via secure chat  Thank you for allowing [place] to participate in the care of this patient. If you have any further questions, please contact  me or  neurohospitalist.   Lindie Spruce Epile[facility]

## 2023-05-20 NOTE — ED Notes (Signed)
Called diet order in

## 2023-05-20 NOTE — Progress Notes (Signed)
*  PRELIMINARY RESULTS* Echocardiogram 2D Echocardiogram has been performed with Definity and a Saline Bubble Study.  Stacey Drain 05/20/2023, 3:32 PM

## 2023-05-20 NOTE — ED Notes (Signed)
ED TO INPATIENT HANDOFF REPORT  ED Nurse Name and Phone #: [phone]  S Name/Age/Gender Jason Mcmahon 57 y.o. male Room/Bed: APA07/APA07  Code Status   Code Status: Prior  Home/SNF/Other Home Patient oriented to: self, place, time, and situation Is this baseline? Yes   Triage Complete: Triage complete  Chief Complaint Acute stroke due to ischemia [facility]) [I63.9]  Triage Note Pt c/o rt side facial numbness and hand starting when he woke up at 0430 this morning. Pt states going to bed completely normal at 2100. EDP at bedside during triage.    Allergies Allergies  Allergen Reactions   Aspirin Anaphylaxis   Lisinopril Cough    Level of Care/Admitting Diagnosis ED Disposition     ED Disposition  Admit   Condition  --   Comment  Hospital Area: [facility] [[id number]]  Level of Care: Telemetry [5]  Covid Evaluation: Asymptomatic - no recent exposure (last 10 days) testing not required  Diagnosis: Acute stroke due to ischemia [facility]) [[id number]]  Admitting Physician: Marylyn Ishihara  Attending Physician: Marylyn Ishihara          B Medical/Surgery History Past Medical History:  Diagnosis Date   Acid reflux    Gout    Hypertension    Primary biliary cholangitis (HCC)    Seizures (HCC)    started on Keppra in April 2022   Past Surgical History:  Procedure Laterality Date   arm surgery     COLONOSCOPY N/A 02/28/2016   Surgeon: Franky Macho, MD; 2 mm tubular adenoma in the ascending colon, 8 mm tubular adenoma in the sigmoid colon, otherwise normal exam.  Repeat in 3 years.   COLONOSCOPY N/A 02/05/2020   Surgeon: Corbin Ade, MD;  Diverticulosis in the sigmoid and descending colon, otherwise normal exam, repeat in 5 years.   ESOPHAGOGASTRODUODENOSCOPY N/A 02/22/2021   Surgeon: Corbin Ade, MD;  Single 5 mm erosion straddling GE junction, otherwise normal esophagus, small hiatal hernia, otherwise normal exam.     A IV  Location/Drains/Wounds Patient Lines/Drains/Airways Status     Active Line/Drains/Airways     Name Placement date Placement time Site Days   Peripheral IV 05/20/23 20 G Left Antecubital 05/20/23  0720  Antecubital  less than 1            Intake/Output Last 24 hours No intake or output data in the 24 hours ending 05/20/23 1055  Labs/Imaging Results for orders placed or performed during the hospital encounter of 05/20/23 (from the past 48 hour(s))  Urine rapid drug screen (hosp performed)     Status: None   Collection Time: 05/20/23  7:19 AM  Result Value Ref Range   Opiates NONE DETECTED NONE DETECTED   Cocaine NONE DETECTED NONE DETECTED   Benzodiazepines NONE DETECTED NONE DETECTED   Amphetamines NONE DETECTED NONE DETECTED   Tetrahydrocannabinol NONE DETECTED NONE DETECTED   Barbiturates NONE DETECTED NONE DETECTED    Comment: (NOTE) DRUG SCREEN FOR MEDICAL PURPOSES ONLY.  IF CONFIRMATION IS NEEDED FOR ANY PURPOSE, NOTIFY LAB WITHIN 5 DAYS.  LOWEST DETECTABLE LIMITS FOR URINE DRUG SCREEN Drug Class                     Cutoff (ng/mL) Amphetamine and metabolites    1000 Barbiturate and metabolites    200 Benzodiazepine                 200 Opiates and metabolites  300 Cocaine and metabolites        300 THC                            50 Performed at [facility], [address], [place], [place] [address]   Urinalysis, Routine w reflex microscopic -Urine, Clean Catch     Status: Abnormal   Collection Time: 05/20/23  7:19 AM  Result Value Ref Range   Color, Urine STRAW (A) YELLOW   APPearance CLEAR CLEAR   Specific Gravity, Urine 1.005 1.005 - 1.030   pH 6.0 5.0 - 8.0   Glucose, UA NEGATIVE NEGATIVE mg/dL   Hgb urine dipstick NEGATIVE NEGATIVE   Bilirubin Urine NEGATIVE NEGATIVE   Ketones, ur NEGATIVE NEGATIVE mg/dL   Protein, ur NEGATIVE NEGATIVE mg/dL   Nitrite NEGATIVE NEGATIVE   Leukocytes,Ua NEGATIVE NEGATIVE    Comment: Performed at [facility], [address] [place], [place] [address]  CBG monitoring, ED     Status: None   Collection Time: 05/20/23  7:19 AM  Result Value Ref Range   Glucose-Capillary 88 70 - 99 mg/dL    Comment: Glucose reference range applies only to samples taken after fasting for at least 8 hours.  Protime-INR     Status: None   Collection Time: 05/20/23  8:18 AM  Result Value Ref Range   Prothrombin Time 13.9 11.4 - 15.2 seconds   INR 1.1 0.8 - 1.2    Comment: (NOTE) INR goal varies based on device and disease states. Performed at [facility], [address] [place], [place] [address]   CBC     Status: Abnormal   Collection Time: 05/20/23  8:18 AM  Result Value Ref Range   WBC 3.8 (L) 4.0 - 10.5 K/uL   RBC 4.40 4.22 - 5.81 MIL/uL   Hemoglobin 14.8 13.0 - 17.0 g/dL   HCT 47.4 25.9 - 56.3 %   MCV 97.7 80.0 - 100.0 fL   MCH 33.6 26.0 - 34.0 pg   MCHC 34.4 30.0 - 36.0 g/dL   RDW 87.5 64.3 - 32.9 %   Platelets 146 (L) 150 - 400 K/uL   nRBC 0.0 0.0 - 0.2 %    Comment: Performed at [facility], [address] [place], [place] [address]  Differential     Status: None   Collection Time: 05/20/23  8:18 AM  Result Value Ref Range   Neutrophils Relative % 59 %   Neutro Abs 2.3 1.7 - 7.7 K/uL   Lymphocytes Relative 31 %   Lymphs Abs 1.2 0.7 - 4.0 K/uL   Monocytes Relative 8 %   Monocytes Absolute 0.3 0.1 - 1.0 K/uL   Eosinophils Relative 0 %   Eosinophils Absolute 0.0 0.0 - 0.5 K/uL   Basophils Relative 1 %   Basophils Absolute 0.0 0.0 - 0.1 K/uL   Immature Granulocytes 1 %   Abs Immature Granulocytes 0.02 0.00 - 0.07 K/uL    Comment: Performed at [facility], [address] [place], [place] [address]  Comprehensive metabolic panel     Status: Abnormal   Collection Time: 05/20/23  8:18 AM  Result Value Ref Range   Sodium 138 135 - 145 mmol/L   Potassium 3.9 3.5 - 5.1 mmol/L   Chloride 104 98 - 111 mmol/L   CO2 27 22 - 32 mmol/L   Glucose, Bld 91 70 - 99 mg/dL    Comment: Glucose reference  range  applies only to samples taken after fasting for at least 8 hours.   BUN 17 6 - 20 mg/dL   Creatinine, Ser 0.98 (H) 0.61 - 1.24 mg/dL   Calcium 9.3 8.9 - 11.9 mg/dL   Total Protein 7.2 6.5 - 8.1 g/dL   Albumin 4.0 3.5 - 5.0 g/dL   AST 34 15 - 41 U/L   ALT 43 0 - 44 U/L   Alkaline Phosphatase 95 38 - 126 U/L   Total Bilirubin 1.3 (H) <1.2 mg/dL   GFR, Estimated >14 >78 mL/min    Comment: (NOTE) Calculated using the CKD-EPI Creatinine Equation (2021)    Anion gap 7 5 - 15    Comment: Performed at [facility], [address], [place], [place] [address]  Ethanol     Status: None   Collection Time: 05/20/23  8:18 AM  Result Value Ref Range   Alcohol, Ethyl (B) <10 <10 mg/dL    Comment: (NOTE) Lowest detectable limit for serum alcohol is 10 mg/dL.  For medical purposes only. Performed at [facility], [address], [place], [place] [address]   Dickie La [address], ED     Status: Abnormal   Collection Time: 05/20/23  8:31 AM  Result Value Ref Range   Sodium 140 135 - 145 mmol/L   Potassium 4.0 3.5 - 5.1 mmol/L   Chloride 104 98 - 111 mmol/L   BUN 16 6 - 20 mg/dL   Creatinine, Ser 5.78 (H) 0.61 - 1.24 mg/dL   Glucose, Bld 87 70 - 99 mg/dL    Comment: Glucose reference range applies only to samples taken after fasting for at least 8 hours.   Calcium, Ion 1.24 1.15 - 1.40 mmol/L   TCO2 26 22 - 32 mmol/L   Hemoglobin 14.6 13.0 - 17.0 g/dL   HCT 46.9 62.9 - 52.8 %   MR BRAIN WO CONTRAST  Result Date: 05/20/2023 CLINICAL DATA:  Neuro deficit with acute stroke suspected. Right arm and face numbness based on prior head CT. EXAM: MRI HEAD WITHOUT CONTRAST MRA HEAD WITHOUT CONTRAST TECHNIQUE: Multiplanar, multi-echo pulse sequences of the brain and surrounding structures were acquired without intravenous contrast. Angiographic images of the Circle of Willis were acquired using MRA technique without intravenous contrast. COMPARISON:  Head CT from earlier today and brain MRI 02/05/2019  FINDINGS: MRI HEAD FINDINGS Brain: Small lacunar infarct at the lateral left thalamus, convincing when considering the reported deficit. No acute hemorrhage, obstructive hydrocephalus, mass, or collection. There is chronic small vessel ischemia in the cerebral white matter with cerebral volume loss that is generalized. Disproportionate subarachnoid spaces and some callosum angle narrowing with corpus callosum upward bulging but no ventriculomegaly to the degree that communicating hydrocephalus is usually entertained. Vascular: Normal flow voids. Skull and upper cervical spine: Normal marrow signal. Sinuses/Orbits: No acute or significant finding. MRA HEAD FINDINGS Anterior circulation: Atheromatous type irregularity of the carotid siphons and intracranial branches. ~ 50% narrowing of the right ICA at the posterior genu. Hypoplastic right A1 segment with origin stenosis that appears advanced. No aneurysm or major branch occlusion. Posterior circulation: Vertebral and basilar arteries are widely patent. The left vertebral artery is dominant. Atheromatous irregularity of branch vessels including a moderate upper division left PCA stenosis. Negative for aneurysm. IMPRESSION: Brain MRI: 1. Acute lacunar infarct in the left thalamus. 2. Atrophy and chronic small vessel disease. MRA: Intracranial atherosclerosis including 50% right ICA and high-grade right A1 stenoses. Electronically Signed   By: Audry Riles.D.  On: 05/20/2023 10:08   MR ANGIO HEAD WO CONTRAST  Result Date: 05/20/2023 CLINICAL DATA:  Neuro deficit with acute stroke suspected. Right arm and face numbness based on prior head CT. EXAM: MRI HEAD WITHOUT CONTRAST MRA HEAD WITHOUT CONTRAST TECHNIQUE: Multiplanar, multi-echo pulse sequences of the brain and surrounding structures were acquired without intravenous contrast. Angiographic images of the Circle of Willis were acquired using MRA technique without intravenous contrast. COMPARISON:  Head CT  from earlier today and brain MRI 02/05/2019 FINDINGS: MRI HEAD FINDINGS Brain: Small lacunar infarct at the lateral left thalamus, convincing when considering the reported deficit. No acute hemorrhage, obstructive hydrocephalus, mass, or collection. There is chronic small vessel ischemia in the cerebral white matter with cerebral volume loss that is generalized. Disproportionate subarachnoid spaces and some callosum angle narrowing with corpus callosum upward bulging but no ventriculomegaly to the degree that communicating hydrocephalus is usually entertained. Vascular: Normal flow voids. Skull and upper cervical spine: Normal marrow signal. Sinuses/Orbits: No acute or significant finding. MRA HEAD FINDINGS Anterior circulation: Atheromatous type irregularity of the carotid siphons and intracranial branches. ~ 50% narrowing of the right ICA at the posterior genu. Hypoplastic right A1 segment with origin stenosis that appears advanced. No aneurysm or major branch occlusion. Posterior circulation: Vertebral and basilar arteries are widely patent. The left vertebral artery is dominant. Atheromatous irregularity of branch vessels including a moderate upper division left PCA stenosis. Negative for aneurysm. IMPRESSION: Brain MRI: 1. Acute lacunar infarct in the left thalamus. 2. Atrophy and chronic small vessel disease. MRA: Intracranial atherosclerosis including 50% right ICA and high-grade right A1 stenoses. Electronically Signed   By: Tiburcio Pea M.D.   On: 05/20/2023 10:08   CT HEAD WO CONTRAST  Result Date: 05/20/2023 CLINICAL DATA:  Right arm and face numbness. EXAM: CT HEAD WITHOUT CONTRAST TECHNIQUE: Contiguous axial images were obtained from the base of the skull through the vertex without intravenous contrast. RADIATION DOSE REDUCTION: This exam was performed according to the departmental dose-optimization program which includes automated exposure control, adjustment of the mA and/or kV according to  patient size and/or use of iterative reconstruction technique. COMPARISON:  Brain MRI 02/05/2019 FINDINGS: Brain: No evidence of acute infarction, hemorrhage, hydrocephalus, extra-axial collection or mass lesion/mass effect. Callosum angle narrowing and disproportionate subarachnoid spaces but no pronounced ventriculomegaly typical of communicating hydrocephalus. Mild periventricular low-density. Cerebral volume loss with mild progression from 2020 Vascular: No hyperdense vessel or unexpected calcification. Skull: Normal. Negative for fracture or focal lesion. Sinuses/Orbits: No acute finding. IMPRESSION: 1. No acute finding. 2. Brain atrophy and chronic small vessel disease that is mildly progressed since 2020 Electronically Signed   By: Tiburcio Pea M.D.   On: 05/20/2023 08:16    Pending Labs Unresulted Labs (From admission, onward)    None       Vitals/Pain Today's Vitals   05/20/23 1015 05/20/23 1030 05/20/23 1045 05/20/23 1050  BP: (!) 190/103 (!) 186/119 (!) 197/113 (!) 200/98  Pulse: 65 64 75 68  Resp: 14 14 15 17   Temp:      TempSrc:      SpO2: 93% 92% 93% 93%  Weight:      Height:      PainSc:        Isolation Precautions No active isolations  Medications Medications  clopidogrel (PLAVIX) tablet 75 mg (has no administration in time range)  atorvastatin (LIPITOR) tablet 40 mg (has no administration in time range)  clopidogrel (PLAVIX) tablet 300 mg (300 mg Oral Given  05/20/23 1046)    Mobility walks     Focused Assessments Neuro Assessment Handoff:  Swallow screen pass? Yes  Cardiac Rhythm: Normal sinus rhythm NIH Stroke Scale  Dizziness Present: No Headache Present: No Interval: Other (Comment) Level of Consciousness (1a.)   : Alert, keenly responsive LOC Questions (1b. )   : Answers both questions correctly LOC Commands (1c. )   : Performs both tasks correctly Best Gaze (2. )  : Normal Visual (3. )  : No visual loss Facial Palsy (4. )    : Normal  symmetrical movements Motor Arm, Left (5a. )   : No drift Motor Arm, Right (5b. ) : No drift Motor Leg, Left (6a. )  : No drift Motor Leg, Right (6b. ) : No drift Limb Ataxia (7. ): Absent Sensory (8. )  : Mild-to-moderate sensory loss, patient feels pinprick is less sharp or is dull on the affected side, or there is a loss of superficial pain with pinprick, but patient is aware of being touched Best Language (9. )  : No aphasia Dysarthria (10. ): Normal Extinction/Inattention (11.)   : No Abnormality Complete NIHSS TOTAL: 1     Neuro Assessment: Within Defined Limits Neuro Checks:   Initial (05/20/23 1610)  Has TPA been given? No If patient is a Neuro Trauma and patient is going to OR before floor call report to 4N Charge nurse: [phone] or [phone]   R Recommendations: See Admitting Provider Note  Report given to:   Additional Notes: plavix 300 mg po NIH 1

## 2023-05-20 NOTE — ED Provider Notes (Signed)
[place] EMERGENCY DEPARTMENT AT [facility] Provider Note   CSN: [id number] Arrival date & time: 05/20/23  2536     History {Add pertinent medical, surgical, social history, OB history to HPI:1} No chief complaint on file.   Jason Mcmahon is a 57 y.o. male.  HPI Patient presents for numbness to the right side of face and right hand.  Medical history includes HTN, COPD, seizures, GERD, PVC, gout.  He is followed by [facility] neurology for history of seizures.  He is prescribed 1 g Keppra XR nightly.  Yesterday, he experienced some low back pain.  Other than that, he was in his normal state of health.  He went to bed at 9 PM.  He woke up this morning at 430 experiencing numbness to right face and right hand.  Symptoms have persisted.  He denies any other current symptoms.  He is not on any blood thinners.    Home Medications Prior to Admission medications   Medication Sig Start Date End Date Taking? Authorizing Provider  allopurinol (ZYLOPRIM) 300 MG tablet Take 300 mg by mouth daily. 09/07/19   [provider]  Cholecalciferol (VITAMIN D3) 50 MCG (2000 UT) capsule Take 2 capsules (4,000 Units total) by mouth daily. Patient taking differently: Take 2,000 Units by mouth daily. 08/04/21   Letta Median, PA-C  irbesartan (AVAPRO) 150 MG tablet Take 1 tablet (150 mg total) by mouth daily. 06/29/21   Nyoka Cowden, MD  levETIRAcetam (KEPPRA XR) 500 MG 24 hr tablet Take 2 tablets (1,000 mg total) by mouth at bedtime. 02/12/23   Lomax, Amy, NP  omeprazole (PRILOSEC) 20 MG capsule TAKE 1 CAPSULE(20 MG) BY MOUTH TWICE DAILY BEFORE MEALS 02/11/23   Rourk, Gerrit Friends, MD  sildenafil (VIAGRA) 25 MG tablet Take 25-100 mg by mouth daily as needed for erectile dysfunction. 01/25/21   [provider]  ursodiol (ACTIGALL) 500 MG tablet TAKE 1 TABLET(500 MG) BY MOUTH TWICE DAILY 08/16/22   Rourk, Gerrit Friends, MD      Allergies    Aspirin and Lisinopril    Review of Systems    Review of Systems  Musculoskeletal:  Positive for back pain.  Neurological:  Positive for numbness.  All other systems reviewed and are negative.   Physical Exam Updated Vital Signs There were no vitals taken for this visit. Physical Exam Vitals and nursing note reviewed.  Constitutional:      General: He is not in acute distress.    Appearance: Normal appearance. He is well-developed. He is not toxic-appearing.  HENT:     Head: Normocephalic and atraumatic.  Eyes:     General: No visual field deficit.    Conjunctiva/sclera: Conjunctivae normal.  Cardiovascular:     Rate and Rhythm: Normal rate and regular rhythm.     Heart sounds: No murmur heard. Pulmonary:     Effort: Pulmonary effort is normal. No respiratory distress.     Breath sounds: Normal breath sounds.  Abdominal:     Palpations: Abdomen is soft.     Tenderness: There is no abdominal tenderness.  Musculoskeletal:        General: No swelling, tenderness or deformity. Normal range of motion.     Cervical back: Neck supple.     Right lower leg: No edema.     Left lower leg: No edema.  Skin:    General: Skin is warm and dry.     Coloration: Skin is not jaundiced or pale.  Neurological:  Mental Status: He is alert.     Cranial Nerves: No dysarthria or facial asymmetry.     Sensory: Sensory deficit (Diminished in station to right side of face and right hand.) present.     Motor: Motor function is intact. No weakness, abnormal muscle tone or pronator drift.     Coordination: Coordination is intact. Finger-Nose-Finger Test normal.     Comments: NIHSS 1  Psychiatric:        Mood and Affect: Mood normal.     ED Results / Procedures / Treatments   Labs (all labs ordered are listed, but only abnormal results are displayed) Labs Reviewed - No data to display  EKG None  Radiology No results found.  Procedures Procedures  {Document cardiac monitor, telemetry assessment procedure when  appropriate:1}  Medications Ordered in ED Medications - No data to display  ED Course/ Medical Decision Making/ A&P   {   Click here for ABCD2, HEART and other calculatorsREFRESH Note before signing :1}                              Medical Decision Making Amount and/or Complexity of Data Reviewed Labs: ordered. Radiology: ordered.  Risk Prescription drug management. Decision regarding hospitalization.   This patient presents to the ED for concern of numbness to face and right hand, this involves an extensive number of treatment options, and is a complaint that carries with it a high risk of complications and morbidity.  The differential diagnosis includes CVA, TIA, seizure, neuropathy, metabolic derangements   Co morbidities that complicate the patient evaluation  HTN, COPD, seizures, GERD, PVC, gout   Additional history obtained:  Additional history obtained from N/A External records from outside source obtained and reviewed including EMR   Lab Tests:  I Ordered, and personally interpreted labs.  The pertinent results include: Baseline creatinine, normal electrolytes, normal hemoglobin, slight leukopenia   Imaging Studies ordered:  I ordered imaging studies including CT head, MRI brain, MRA head I independently visualized and interpreted imaging which showed acute lacunar infarct in left thalamus I agree with the radiologist interpretation   Cardiac Monitoring: / EKG:  The patient was maintained on a cardiac monitor.  I personally viewed and interpreted the cardiac monitored which showed an underlying rhythm of: Sinus rhythm   Consultations Obtained:  I requested consultation with the neurologist, Dr. Selina Cooley,  and discussed lab and imaging findings as well as pertinent plan - they recommend: Admission to [facility]   Problem List / ED Course / Critical interventions / Medication management  Patient presents for numbness to right side of face and right hand.   On arrival, he is alert and oriented.  On exam, he endorses diminished sensation to right side of face and right hand.  There are no another focal findings on exam.  NIHSS is 1.  Last known well was 9 PM.  History and exam not consistent with LVO.  Will obtain workup for possible small stroke.***. I ordered medication including ***  for ***  Reevaluation of the patient after these medicines showed that the patient {resolved/improved/worsened:23923::"improved"} I have reviewed the patients home medicines and have made adjustments as needed   Social Determinants of Health:  ***   Test / Admission - Considered:  ***   {Document critical care time when appropriate:1} {Document review of labs and clinical decision tools ie heart score, Chads2Vasc2 etc:1}  {Document your independent review of radiology images,  and any outside records:1} {Document your discussion with family members, caretakers, and with consultants:1} {Document social determinants of health affecting pt's care:1} {Document your decision making why or why not admission, treatments were needed:1} Final Clinical Impression(s) / ED Diagnoses Final diagnoses:  None    Rx / DC Orders ED Discharge Orders     None

## 2023-05-21 ENCOUNTER — Telehealth: Payer: Self-pay | Admitting: *Deleted

## 2023-05-21 DIAGNOSIS — I6381 Other cerebral infarction due to occlusion or stenosis of small artery: Secondary | ICD-10-CM | POA: Diagnosis not present

## 2023-05-21 DIAGNOSIS — I639 Cerebral infarction, unspecified: Secondary | ICD-10-CM

## 2023-05-21 DIAGNOSIS — N182 Chronic kidney disease, stage 2 (mild): Secondary | ICD-10-CM | POA: Diagnosis not present

## 2023-05-21 DIAGNOSIS — M109 Gout, unspecified: Secondary | ICD-10-CM | POA: Diagnosis not present

## 2023-05-21 DIAGNOSIS — I129 Hypertensive chronic kidney disease with stage 1 through stage 4 chronic kidney disease, or unspecified chronic kidney disease: Secondary | ICD-10-CM | POA: Diagnosis not present

## 2023-05-21 LAB — HIV ANTIBODY (ROUTINE TESTING W REFLEX): HIV Screen 4th Generation wRfx: NONREACTIVE

## 2023-05-21 MED ORDER — CLOPIDOGREL BISULFATE 75 MG PO TABS: 75.0000 mg | ORAL_TABLET | Freq: Every day | ORAL | 3 refills | Status: DC

## 2023-05-21 MED ORDER — CLOPIDOGREL BISULFATE 75 MG PO TABS: 75.0000 mg | ORAL_TABLET | Freq: Every day | ORAL | 3 refills | Status: AC

## 2023-05-21 MED ORDER — LEVETIRACETAM ER 1000 MG PO TB24: 1000.0000 mg | ORAL_TABLET | Freq: Every day | ORAL | 3 refills | Status: DC

## 2023-05-21 MED ORDER — ATORVASTATIN CALCIUM 40 MG PO TABS: 40.0000 mg | ORAL_TABLET | Freq: Every day | ORAL | 3 refills | Status: AC

## 2023-05-21 MED ORDER — ATORVASTATIN CALCIUM 40 MG PO TABS: 40.0000 mg | ORAL_TABLET | Freq: Every day | ORAL | 3 refills | Status: DC

## 2023-05-21 MED ORDER — IRBESARTAN 150 MG PO TABS: 150.0000 mg | ORAL_TABLET | Freq: Every day | ORAL | 3 refills | Status: AC

## 2023-05-21 NOTE — Telephone Encounter (Signed)
Request for Dr. Mariea Clonts for 30 day event monitor for stroke. Order placed and pt enrolled.

## 2023-05-21 NOTE — Evaluation (Signed)
Occupational Therapy Evaluation Patient Details Name: Jason Mcmahon MRN: [id number] DOB: [birth year] Today's Date: 05/21/2023   History of Present Illness Jason Mcmahon  is a 57 y.o. male reformed smoker (54 pack years), COPD, HTN GERD and history of seizures-who presented to the ED with complaints of right upper extremity numbness as well as right-sided facial numbness when he woke up this morning (around 4:30 PM (per MD)   Clinical Impression   Pt agreeable to OT and PT co-evaluation. Pt appears to be at baseline for functional ADL's and mobility. R hand still lacking sensation but no functional impact in regards to areas tested. Pt is not recommended for further acute OT services and will be discharged to care of nursing staff for remaining length of stay.             Functional Status Assessment  Patient has not had a recent decline in their functional status  Equipment Recommendations  None recommended by OT           Precautions / Restrictions Precautions Precautions: None Restrictions Weight Bearing Restrictions: No      Mobility Bed Mobility Overal bed mobility: Independent                  Transfers Overall transfer level: Independent                        Balance Overall balance assessment: Independent                                         ADL either performed or assessed with clinical judgement   ADL Overall ADL's : Independent                                             Vision Baseline Vision/History: 1 Wears glasses Ability to See in Adequate Light: 0 Adequate Patient Visual Report: No change from baseline Vision Assessment?: No apparent visual deficits     Perception Perception: Not tested       Praxis Praxis: WFL       Pertinent Vitals/Pain Pain Assessment Pain Assessment: No/denies pain     Extremity/Trunk Assessment Upper Extremity Assessment Upper Extremity Assessment:  Right hand dominant;RUE deficits/detail RUE Deficits / Details: Decreased sensation in R hand. Normal otherwise. RUE Sensation: decreased light touch RUE Coordination: WNL   Lower Extremity Assessment Lower Extremity Assessment: Defer to PT evaluation   Cervical / Trunk Assessment Cervical / Trunk Assessment: Normal   Communication Communication Communication: No apparent difficulties   Cognition Arousal: Alert Behavior During Therapy: WFL for tasks assessed/performed Overall Cognitive Status: Within Functional Limits for tasks assessed                                                        Home Living Family/patient expects to be discharged to:: Private residence Living Arrangements: Spouse/significant other Available Help at Discharge: Family;Available 24 hours/day Type of Home: House Home Access: Stairs to enter [employer] of Steps: 2 Entrance Stairs-Rails: Right (going up) Home Layout: One level     Bathroom Shower/Tub: Walk-in  shower   Bathroom Toilet: Standard Bathroom Accessibility: Yes How Accessible: Accessible via wheelchair;Accessible via walker Home Equipment: Shower seat          Prior Functioning/Environment Prior Level of Function : Independent/Modified Independent;Driving;Working/employed             Mobility Comments: Tourist information centre manager without AD ADLs Comments: Independent                                Co-evaluation PT/OT/SLP Co-Evaluation/Treatment: Yes Reason for Co-Treatment: To address functional/ADL transfers   OT goals addressed during session: ADL's and self-care      AM-PAC OT "6 Clicks" Daily Activity     Outcome Measure Help from another person eating meals?: None Help from another person taking care of personal grooming?: None Help from another person toileting, which includes using toliet, bedpan, or urinal?: None Help from another person bathing (including washing, rinsing,  drying)?: None Help from another person to put on and taking off regular upper body clothing?: None Help from another person to put on and taking off regular lower body clothing?: None 6 Click Score: 24   End of Session    Activity Tolerance: Patient tolerated treatment well Patient left: in bed;with call bell/phone within reach;with family/visitor present  OT Visit Diagnosis: Other symptoms and signs involving the nervous system (G95.621)                Time: 3086-5784 OT Time Calculation (min): 9 min Charges:  OT General Charges $OT Visit: 1 Visit OT Evaluation $OT Eval Low Complexity: 1 Low  Jason Mcmahon OT, MOT  Jason Mcmahon 05/21/2023, 9:40 AM

## 2023-05-21 NOTE — Discharge Instructions (Addendum)
1)Take Clopidogrel/Plavix and Lipitor/atorvastatin as prescribed to reduce your risk of getting another stroke 2)Avoid Tobacco exposure 3)Avoid ibuprofen/Advil/Aleve/Motrin/Goody Powders/Naproxen/BC powders/Meloxicam/Diclofenac/Indomethacin and other Nonsteroidal anti-inflammatory medications as these will make you more likely to bleed and can cause stomach ulcers, can also cause Kidney problems.  4) follow-up with primary care physician within a week for blood pressure recheck 5) follow-up with neurologist as outpatient as advised 6) you will receive an event monitor/heart monitor in the mail with instructions on how to wear it and wear to returning to--you will need to wear it for about 30 days to look for irregular heartbeat that may increase your risk for stroke 7)Repeat CBC blood test within a week

## 2023-05-21 NOTE — TOC Transition Note (Signed)
Patient Details  Name: Jason Mcmahon MRN: [id number] Date of Birth: 02-Jun-1966  Transition of Care [facility]) CM/SW Contact:    Leitha Bleak, RN Phone Number: 05/21/2023, 10:08 AM  NO PT follow up recommended, discharging home.   Transition of Care Asessment: Insurance and Status: Insurance coverage has been reviewed Patient has primary care physician: Yes Home environment has been reviewed: Home with spouse Prior level of function:: independent Prior/Current Home Services: No current home services Social Determinants of Health Reivew: SDOH reviewed no interventions necessary Readmission risk has been reviewed: Yes Transition of care needs: no transition of care needs at this time   Final next level of care: Home/Self Care Barriers to Discharge: No Barriers Identified  Social Determinants of Health (SDOH) Interventions SDOH Screenings   Food Insecurity: No Food Insecurity (05/20/2023)  Housing: Low Risk  (05/20/2023)  Transportation Needs: No Transportation Needs (05/20/2023)  Utilities: Not At Risk (05/20/2023)  Financial Resource Strain: Low Risk  (04/12/2023)   Received from [facility]  Physical Activity: Insufficiently Active (04/12/2023)   Received from [facility]  Social Connections: Socially Integrated (04/12/2023)   Received from [facility]  Stress: No Stress Concern Present (04/12/2023)   Received from [facility]  Tobacco Use: Medium Risk (05/20/2023)     Readmission Risk Interventions     No data to display

## 2023-05-21 NOTE — Discharge Summary (Signed)
Jason Mcmahon, is a 57 y.o. male  DOB 03-05-1966  MRN [id number].  Admission date:  05/20/2023  Admitting Physician  Daneille Desilva Mariea Clonts, MD  Discharge Date:  05/21/2023   Primary MD  Elder Negus, NP  Recommendations for primary care physician for things to follow:  1)Take Clopidogrel/Plavix and Lipitor/atorvastatin as prescribed to reduce your risk of getting another stroke 2)Avoid Tobacco exposure 3)Avoid ibuprofen/Advil/Aleve/Motrin/Goody Powders/Naproxen/BC powders/Meloxicam/Diclofenac/Indomethacin and other Nonsteroidal anti-inflammatory medications as these will make you more likely to bleed and can cause stomach ulcers, can also cause Kidney problems.  4) follow-up with primary care physician within a week for blood pressure recheck 5) follow-up with neurologist as outpatient as advised 6) you will receive an event monitor/heart monitor in the mail with instructions on how to wear it and wear to returning to--you will need to wear it for about 30 days to look for irregular heartbeat that may increase your risk for stroke 7)Repeat CBC blood test within a week  Admission Diagnosis  Cerebrovascular accident (CVA), unspecified mechanism (HCC) [I63.9] Acute stroke due to ischemia [facility]) [I63.9]  Discharge Diagnosis  Cerebrovascular accident (CVA), unspecified mechanism (HCC) [I63.9] Acute stroke due to ischemia [facility]) [I63.9]    Principal Problem:   Acute Lacunar Infarct in the left thalamus- Active Problems:   HTN   Seizure disorder (HCC)   COPD (chronic obstructive pulmonary disease) (HCC)   Gastroesophageal reflux disease      Past Medical History:  Diagnosis Date   Acid reflux    Gout    Hypertension    Primary biliary cholangitis (HCC)    Seizures (HCC)    started on Keppra in April 2022    Past Surgical History:  Procedure Laterality Date   arm surgery     COLONOSCOPY N/A 02/28/2016    Surgeon: Franky Macho, MD; 2 mm tubular adenoma in the ascending colon, 8 mm tubular adenoma in the sigmoid colon, otherwise normal exam.  Repeat in 3 years.   COLONOSCOPY N/A 02/05/2020   Surgeon: Corbin Ade, MD;  Diverticulosis in the sigmoid and descending colon, otherwise normal exam, repeat in 5 years.   ESOPHAGOGASTRODUODENOSCOPY N/A 02/22/2021   Surgeon: Corbin Ade, MD;  Single 5 mm erosion straddling GE junction, otherwise normal esophagus, small hiatal hernia, otherwise normal exam.      HPI  from the history and physical done on the day of admission:  Jason Mcmahon  is a 57 y.o. male reformed smoker (54 pack years), COPD, HTN GERD and history of seizures-who presented to the ED with complaints of right upper extremity numbness as well as right-sided facial numbness when he woke up this morning (around 4:30 PM -Last known well around 9 PM yesterday when he went to bed No fever  Or chills  No chest pains no palpitations no dizziness, no speech problems -No headaches no visual disturbance No Nausea, Vomiting or Diarrhea -CBC with a white count of 3.8 platelets 146 hemoglobin 14.8 -Creatinine 1.28, UA and UDS unremarkable -LFTs are not elevated except for total  bilirubin of 1.3 -EKG is normal sinus rhythm -Carotid artery Dopplers without hemodynamically significant stenosis -MRI brain and MRA head with acute lacunar infarct in the left thalamus, there is atrophy and chronic small vessel disease, patient had 50% right ICA and high-grade right A1 stenosis -Echo with EF greater than 75% with hyperdynamic left ventricle, grade 1 diastolic dysfunction noted, no evidence of mitral stenosis, no aortic stenosis -Neurology input appreciated     Hospital Course:     1)Acute Lacunar Infarct in the left thalamus- -right upper extremity numbness as well as right-sided facial numbness  -MRI brain and MRA head with acute lacunar infarct in the left thalamus, there is atrophy and  chronic small vessel disease, patient had 50% right ICA and high-grade right A1 stenosis. --Carotid artery Dopplers without hemodynamically significant stenosis -Echo with EF greater than 75% with hyperdynamic left ventricle, grade 1 diastolic dysfunction noted, no evidence of mitral stenosis, no aortic stenosis -EKG is normal sinus rhythm, INR 1.1 A1C 5.0 LDL 86, HDL 27, T chol 138, Trig 125 -Neurology input appreciated --Okay to discharge on  Plavix and atorvastatin.. (reports Aspirin intolerance----Gi) -Outpatient 30-day event monitor advised   2)HTN----initially allowed permissive hypertension  -okay to restart Irbesartan   3)CKD Stage - 2 Creatinine 1.30 on 04/17/2023 Creatinine was 1.48 on 09/17/2022 - renally adjust medications, avoid nephrotoxic agents / dehydration  / hypotension   4)Gout--- no flare up, c/n allopurinol   5)History of Seizures---No recent seizures, c/n Keppra   6)Leukopenia and Thrombocytopenia----?? Etiology -Repeat CBC within a week as outpt especially while on Plavix   Dispo: The patient is from: Home              Anticipated d/c is to: Home  Discharge Condition: Stable  Follow UP----neurology  Consults obtained -neurology  Diet and Activity recommendation:  As advised  Discharge Instructions    Discharge Instructions     Ambulatory referral to Neurology   Complete by: As directed    An appointment is requested in approximately: 8-12 weeks   Call MD for:  difficulty breathing, headache or visual disturbances   Complete by: As directed    Call MD for:  persistant dizziness or light-headedness   Complete by: As directed    Call MD for:  persistant nausea and vomiting   Complete by: As directed    Call MD for:  temperature >100.4   Complete by: As directed    Diet - low sodium heart healthy   Complete by: As directed    Discharge instructions   Complete by: As directed    1)Take Clopidogrel/Plavix and Lipitor/atorvastatin as prescribed  to reduce your risk of getting another stroke 2)Avoid Tobacco exposure 3)Avoid ibuprofen/Advil/Aleve/Motrin/Goody Powders/Naproxen/BC powders/Meloxicam/Diclofenac/Indomethacin and other Nonsteroidal anti-inflammatory medications as these will make you more likely to bleed and can cause stomach ulcers, can also cause Kidney problems.  4) follow-up with primary care physician within a week for blood pressure recheck 5) follow-up with neurologist as outpatient as advised 6) you will receive an event monitor/heart monitor in the mail with instructions on how to wear it and wear to returning to--you will need to wear it for about 30 days to look for irregular heartbeat that may increase your risk for stroke 7)Repeat CBC blood test within a week   Increase activity slowly   Complete by: As directed        Discharge Medications     Allergies as of 05/21/2023       Reactions  Aspirin Anaphylaxis   Lisinopril Cough        Medication List     TAKE these medications    allopurinol 300 MG tablet Commonly known as: ZYLOPRIM Take 300 mg by mouth daily.   atorvastatin 40 MG tablet Commonly known as: LIPITOR Take 1 tablet (40 mg total) by mouth daily. Start taking on: May 22, 2023   clopidogrel 75 MG tablet Commonly known as: PLAVIX Take 1 tablet (75 mg total) by mouth daily. Start taking on: May 22, 2023   irbesartan 150 MG tablet Commonly known as: Avapro Take 1 tablet (150 mg total) by mouth daily.   levETIRAcetam ER 1000 MG Tb24 Take 1,000 mg by mouth at bedtime. What changed: medication strength   omeprazole 20 MG capsule Commonly known as: PRILOSEC TAKE 1 CAPSULE(20 MG) BY MOUTH TWICE DAILY BEFORE MEALS What changed: See the new instructions.   sildenafil 25 MG tablet Commonly known as: VIAGRA Take 25-100 mg by mouth daily as needed for erectile dysfunction.   Uloric 40 MG tablet Generic drug: febuxostat Take by mouth.   ursodiol 500 MG  tablet Commonly known as: ACTIGALL TAKE 1 TABLET(500 MG) BY MOUTH TWICE DAILY What changed: See the new instructions.   Vitamin D3 50 MCG (2000 UT) capsule Take 2 capsules (4,000 Units total) by mouth daily. What changed: how much to take       Major procedures and Radiology Reports - PLEASE review detailed and final reports for all details, in brief -   US Carotid Bilateral  Result Date: 05/20/2023 CLINICAL DATA:  Stroke Hypertension Former tobacco user EXAM: BILATERAL CAROTID DUPLEX ULTRASOUND TECHNIQUE: Wallace Cullens scale imaging, color Doppler and duplex ultrasound were performed of bilateral carotid and vertebral arteries in the neck. COMPARISON:  None available FINDINGS: Criteria: Quantification of carotid stenosis is based on velocity parameters that correlate the residual internal carotid diameter with NASCET-based stenosis levels, using the diameter of the distal internal carotid lumen as the denominator for stenosis measurement. The following velocity measurements were obtained: RIGHT ICA: 103/27 cm/sec CCA: 95/15 cm/sec SYSTOLIC ICA/CCA RATIO:  1.1 ECA: 108 cm/sec LEFT ICA: 93/27 cm/sec CCA: 103/22 cm/sec SYSTOLIC ICA/CCA RATIO:  0.9 ECA: 110 cm/sec RIGHT CAROTID ARTERY: Mild focal calcified plaque of the right ICA origin. RIGHT VERTEBRAL ARTERY:  Antegrade flow. LEFT CAROTID ARTERY:  Mild calcified plaque of the left ICA origin. LEFT VERTEBRAL ARTERY:  Antegrade flow. IMPRESSION: Mild calcified plaque of the internal carotid artery origins with Doppler measurements consistent with less than 50% stenosis. Electronically Signed   By: Acquanetta Belling M.D.   On: 05/20/2023 16:04   ECHOCARDIOGRAM COMPLETE  Result Date: 05/20/2023    ECHOCARDIOGRAM REPORT   Patient Name:   DERRAL AUGSBURGER Date of Exam: 05/20/2023 Medical Rec #:  [id number]       Height:       68.0 in Accession #:    [id number]      Weight:       196.0 lb Date of Birth:  [birth year]       BSA:          2.026 m Patient Age:    57 years         BP:           188/108 mmHg Patient Gender: M               HR:           70 bpm. [facility] Procedure: 2D  Echo, Cardiac Doppler, Color Doppler, Saline Contrast Bubble Study            and Intracardiac Opacification Agent Indications:    Stroke l63.9  History:        Patient has no prior history of Echocardiogram examinations.                 COPD, Arrythmias:Tachycardia; Risk Factors:Hypertension.  Sonographer:    Celesta Gentile RCS Referring Phys: [id number] Diem Dicocco IMPRESSIONS  1. Left ventricular ejection fraction, by estimation, is >75%. The left ventricle has hyperdynamic function. The left ventricle has no regional wall motion abnormalities. There is moderate asymmetric left ventricular hypertrophy of the septal segment. Left ventricular diastolic parameters are consistent with Grade I diastolic dysfunction (impaired relaxation). Elevated left ventricular end-diastolic pressure.  2. Right ventricular systolic function is normal. The right ventricular size is normal. Tricuspid regurgitation signal is inadequate for assessing PA pressure.  3. The mitral valve is abnormal. No evidence of mitral valve regurgitation. No evidence of mitral stenosis. Moderate mitral annular calcification.  4. The aortic valve is tricuspid. There is mild calcification of the aortic valve. Aortic valve regurgitation is not visualized. No aortic stenosis is present.  5. The inferior vena cava is normal in size with greater than 50% respiratory variability, suggesting right atrial pressure of 3 mmHg.  6. Agitated saline contrast bubble study was negative, with no evidence of any interatrial shunt. Comparison(s): No prior Echocardiogram. FINDINGS  Left Ventricle: Left ventricular ejection fraction, by estimation, is >75%. The left ventricle has hyperdynamic function. The left ventricle has no regional wall motion abnormalities. Definity contrast agent was given IV to delineate the left ventricular endocardial  borders. The left ventricular internal cavity size was normal in size. There is moderate asymmetric left ventricular hypertrophy of the septal segment. Left ventricular diastolic parameters are consistent with Grade I diastolic dysfunction (impaired relaxation). Elevated left ventricular end-diastolic pressure. Right Ventricle: The right ventricular size is normal. No increase in right ventricular wall thickness. Right ventricular systolic function is normal. Tricuspid regurgitation signal is inadequate for assessing PA pressure. Left Atrium: Left atrial size was normal in size. Right Atrium: Right atrial size was normal in size. Pericardium: There is no evidence of pericardial effusion. Mitral Valve: The mitral valve is abnormal. Moderate mitral annular calcification. No evidence of mitral valve regurgitation. No evidence of mitral valve stenosis. Tricuspid Valve: The tricuspid valve is not well visualized. Tricuspid valve regurgitation is not demonstrated. No evidence of tricuspid stenosis. Aortic Valve: The aortic valve is tricuspid. There is mild calcification of the aortic valve. Aortic valve regurgitation is not visualized. No aortic stenosis is present. Pulmonic Valve: The pulmonic valve was not well visualized. Pulmonic valve regurgitation is not visualized. No evidence of pulmonic stenosis. Aorta: The aortic root is normal in size and structure. Venous: The inferior vena cava is normal in size with greater than 50% respiratory variability, suggesting right atrial pressure of 3 mmHg. IAS/Shunts: No atrial level shunt detected by color flow Doppler. Agitated saline contrast was given intravenously to evaluate for intracardiac shunting. Agitated saline contrast bubble study was negative, with no evidence of any interatrial shunt.  LEFT VENTRICLE PLAX 2D LVIDd:         4.00 cm   Diastology LVIDs:         2.15 cm   LV e' medial:    4.35 cm/s LV PW:         1.30 cm   LV E/e' medial:  17.8  LV IVS:        1.35 cm    LV e' lateral:   5.55 cm/s LVOT diam:     2.30 cm   LV E/e' lateral: 14.0 LV SV:         91 LV SV Index:   45 LVOT Area:     4.15 cm  RIGHT VENTRICLE RV S prime:     12.00 cm/s TAPSE (M-mode): 2.0 cm LEFT ATRIUM           Index LA diam:      3.73 cm 1.84 cm/m LA Vol (A4C): 38.9 ml 19.20 ml/m  AORTIC VALVE LVOT Vmax:   88.30 cm/s LVOT Vmean:  68.000 cm/s LVOT VTI:    0.220 m  AORTA Ao Root diam: 3.60 cm MITRAL VALVE MV Area (PHT): 2.91 cm    SHUNTS MV Decel Time: 261 msec    Systemic VTI:  0.22 m MV E velocity: 77.50 cm/s  Systemic Diam: 2.30 cm MV A velocity: 98.20 cm/s MV E/A ratio:  0.79 Vishnu Priya Mallipeddi Electronically signed by Winfield Rast Mallipeddi Signature Date/Time: 05/20/2023/3:42:46 PM    Final    MR BRAIN WO CONTRAST  Result Date: 05/20/2023 CLINICAL DATA:  Neuro deficit with acute stroke suspected. Right arm and face numbness based on prior head CT. EXAM: MRI HEAD WITHOUT CONTRAST MRA HEAD WITHOUT CONTRAST TECHNIQUE: Multiplanar, multi-echo pulse sequences of the brain and surrounding structures were acquired without intravenous contrast. Angiographic images of the Circle of Willis were acquired using MRA technique without intravenous contrast. COMPARISON:  Head CT from earlier today and brain MRI 02/05/2019 FINDINGS: MRI HEAD FINDINGS Brain: Small lacunar infarct at the lateral left thalamus, convincing when considering the reported deficit. No acute hemorrhage, obstructive hydrocephalus, mass, or collection. There is chronic small vessel ischemia in the cerebral white matter with cerebral volume loss that is generalized. Disproportionate subarachnoid spaces and some callosum angle narrowing with corpus callosum upward bulging but no ventriculomegaly to the degree that communicating hydrocephalus is usually entertained. Vascular: Normal flow voids. Skull and upper cervical spine: Normal marrow signal. Sinuses/Orbits: No acute or significant finding. MRA HEAD FINDINGS Anterior  circulation: Atheromatous type irregularity of the carotid siphons and intracranial branches. ~ 50% narrowing of the right ICA at the posterior genu. Hypoplastic right A1 segment with origin stenosis that appears advanced. No aneurysm or major branch occlusion. Posterior circulation: Vertebral and basilar arteries are widely patent. The left vertebral artery is dominant. Atheromatous irregularity of branch vessels including a moderate upper division left PCA stenosis. Negative for aneurysm. IMPRESSION: Brain MRI: 1. Acute lacunar infarct in the left thalamus. 2. Atrophy and chronic small vessel disease. MRA: Intracranial atherosclerosis including 50% right ICA and high-grade right A1 stenoses. Electronically Signed   By: Tiburcio Pea M.D.   On: 05/20/2023 10:08   MR ANGIO HEAD WO CONTRAST  Result Date: 05/20/2023 CLINICAL DATA:  Neuro deficit with acute stroke suspected. Right arm and face numbness based on prior head CT. EXAM: MRI HEAD WITHOUT CONTRAST MRA HEAD WITHOUT CONTRAST TECHNIQUE: Multiplanar, multi-echo pulse sequences of the brain and surrounding structures were acquired without intravenous contrast. Angiographic images of the Circle of Willis were acquired using MRA technique without intravenous contrast. COMPARISON:  Head CT from earlier today and brain MRI 02/05/2019 FINDINGS: MRI HEAD FINDINGS Brain: Small lacunar infarct at the lateral left thalamus, convincing when considering the reported deficit. No acute hemorrhage, obstructive hydrocephalus, mass, or collection. There is chronic small vessel ischemia in the cerebral  white matter with cerebral volume loss that is generalized. Disproportionate subarachnoid spaces and some callosum angle narrowing with corpus callosum upward bulging but no ventriculomegaly to the degree that communicating hydrocephalus is usually entertained. Vascular: Normal flow voids. Skull and upper cervical spine: Normal marrow signal. Sinuses/Orbits: No acute or  significant finding. MRA HEAD FINDINGS Anterior circulation: Atheromatous type irregularity of the carotid siphons and intracranial branches. ~ 50% narrowing of the right ICA at the posterior genu. Hypoplastic right A1 segment with origin stenosis that appears advanced. No aneurysm or major branch occlusion. Posterior circulation: Vertebral and basilar arteries are widely patent. The left vertebral artery is dominant. Atheromatous irregularity of branch vessels including a moderate upper division left PCA stenosis. Negative for aneurysm. IMPRESSION: Brain MRI: 1. Acute lacunar infarct in the left thalamus. 2. Atrophy and chronic small vessel disease. MRA: Intracranial atherosclerosis including 50% right ICA and high-grade right A1 stenoses. Electronically Signed   By: Tiburcio Pea M.D.   On: 05/20/2023 10:08   CT HEAD WO CONTRAST  Result Date: 05/20/2023 CLINICAL DATA:  Right arm and face numbness. EXAM: CT HEAD WITHOUT CONTRAST TECHNIQUE: Contiguous axial images were obtained from the base of the skull through the vertex without intravenous contrast. RADIATION DOSE REDUCTION: This exam was performed according to the departmental dose-optimization program which includes automated exposure control, adjustment of the mA and/or kV according to patient size and/or use of iterative reconstruction technique. COMPARISON:  Brain MRI 02/05/2019 FINDINGS: Brain: No evidence of acute infarction, hemorrhage, hydrocephalus, extra-axial collection or mass lesion/mass effect. Callosum angle narrowing and disproportionate subarachnoid spaces but no pronounced ventriculomegaly typical of communicating hydrocephalus. Mild periventricular low-density. Cerebral volume loss with mild progression from 2020 Vascular: No hyperdense vessel or unexpected calcification. Skull: Normal. Negative for fracture or focal lesion. Sinuses/Orbits: No acute finding. IMPRESSION: 1. No acute finding. 2. Brain atrophy and chronic small vessel  disease that is mildly progressed since 2020 Electronically Signed   By: Tiburcio Pea M.D.   On: 05/20/2023 08:16    Today   Subjective    Koltin Muzio today has no new complaints      -Right upper extremity and facial numbness improving    Patient has been seen and examined prior to discharge   Objective   Blood pressure (!) 172/98, pulse 86, temperature 98.4 F (36.9 C), temperature source Oral, resp. rate 20, height 5\' 8"  (1.727 m), weight 88.9 kg, SpO2 95%.   Intake/Output Summary (Last 24 hours) at 05/21/2023 1119 Last data filed at 05/21/2023 0700 Gross per 24 hour  Intake 600 ml  Output --  Net 600 ml    Exam Gen:- Awake Alert, no acute distress  HEENT:- [place].AT, No sclera icterus Neck-Supple Neck,No JVD,.  Lungs-  CTAB , good air movement bilaterally CV- S1, S2 normal, regular Abd-  +ve B.Sounds, Abd Soft, No tenderness,    Extremity/Skin:- No  edema,   good pulses Psych-affect is appropriate, oriented x3 Neurological - screening mental status exam normal, neck supple without rigidity, -improving right upper extremity numbness improving, right facial numbness improving (Sensation --> 1 = Mild-moderate loss: less sharp/more dull )    Data Review   CBC w Diff:  Lab Results  Component Value Date   WBC 3.8 (L) 05/20/2023   HGB 14.6 05/20/2023   HGB 14.9 11/01/2020   HCT 43.0 05/20/2023   HCT 42.4 11/01/2020   PLT 146 (L) 05/20/2023   PLT 145 (L) 11/01/2020   LYMPHOPCT 31 05/20/2023   MONOPCT 8  05/20/2023   EOSPCT 0 05/20/2023   BASOPCT 1 05/20/2023   CMP:  Lab Results  Component Value Date   NA 140 05/20/2023   NA 145 (H) 11/01/2020   K 4.0 05/20/2023   CL 104 05/20/2023   CO2 27 05/20/2023   BUN 16 05/20/2023   BUN 16 11/01/2020   CREATININE 1.30 (H) 05/20/2023   CREATININE 1.31 07/07/2020   PROT 7.2 05/20/2023   PROT 7.2 10/31/2021   ALBUMIN 4.0 05/20/2023   ALBUMIN 4.6 10/31/2021   BILITOT 1.3 (H) 05/20/2023   BILITOT 1.0 10/31/2021    ALKPHOS 95 05/20/2023   AST 34 05/20/2023   ALT 43 05/20/2023  . Total Discharge time is about 33 minutes  Shon Hale M.D on 05/21/2023 at 11:19 AM  Go to [email or web address] -  for contact info  [facility] - Office  [phone]

## 2023-05-21 NOTE — Evaluation (Signed)
Physical Therapy Evaluation Patient Details Name: Jason Mcmahon MRN: [id number] DOB: August 14, 1965 Today's Date: 05/21/2023  History of Present Illness  Jason Mcmahon  is a 57 y.o. male reformed smoker (54 pack years), COPD, HTN GERD and history of seizures-who presented to the ED with complaints of right upper extremity numbness as well as right-sided facial numbness when he woke up this morning (around 4:30 PM   Clinical Impression  Patient functioning at baseline for functional mobility and gait demonstrating good return for ambulating in room/hallways without loss of balance or need for an AD.  Plan:  Patient discharged from physical therapy to care of nursing for ambulation daily as tolerated for length of stay.          If plan is discharge home, recommend the following:     Can travel by private vehicle        Equipment Recommendations None recommended by PT  Recommendations for Other Services       Functional Status Assessment Patient has not had a recent decline in their functional status     Precautions / Restrictions Precautions Precautions: None Restrictions Weight Bearing Restrictions: No      Mobility  Bed Mobility Overal bed mobility: Independent                  Transfers Overall transfer level: Independent                      Ambulation/Gait Ambulation/Gait assistance: Independent Gait Distance (Feet): 200 Feet Assistive device: None Gait Pattern/deviations: WFL(Within Functional Limits) Gait velocity: normal     General Gait Details: Demonstrates good return for ambulating in room/hallways without loss of balance  Stairs            Wheelchair Mobility     Tilt Bed    Modified Rankin (Stroke Patients Only)       Balance Overall balance assessment: Independent                                           Pertinent Vitals/Pain Pain Assessment Pain Assessment: No/denies pain    Home Living  Family/patient expects to be discharged to:: Private residence Living Arrangements: Spouse/significant other Available Help at Discharge: Family;Available 24 hours/day Type of Home: House Home Access: Stairs to enter Entrance Stairs-Rails: Right Entrance Stairs-Number of Steps: 2   Home Layout: One level Home Equipment: Shower seat      Prior Function Prior Level of Function : Independent/Modified Independent;Driving;Working/employed             Mobility Comments: Tourist information centre manager without AD, works ADLs Comments: Independent     Extremity/Trunk Assessment   Upper Extremity Assessment Upper Extremity Assessment: Defer to OT evaluation RUE Deficits / Details: Decreased sensation in R hand. Normal otherwise. RUE Sensation: decreased light touch RUE Coordination: WNL    Lower Extremity Assessment Lower Extremity Assessment: Overall WFL for tasks assessed    Cervical / Trunk Assessment Cervical / Trunk Assessment: Normal  Communication   Communication Communication: No apparent difficulties  Cognition Arousal: Alert Behavior During Therapy: WFL for tasks assessed/performed Overall Cognitive Status: Within Functional Limits for tasks assessed  General Comments      Exercises     Assessment/Plan    PT Assessment Patient does not need any further PT services  PT Problem List         PT Treatment Interventions      PT Goals (Current goals can be found in the Care Plan section)  Acute Rehab PT Goals Patient Stated Goal: Return home PT Goal Formulation: With patient/family Time For Goal Achievement: 05/21/23 Potential to Achieve Goals: Good    Frequency       Co-evaluation PT/OT/SLP Co-Evaluation/Treatment: Yes Reason for Co-Treatment: To address functional/ADL transfers PT goals addressed during session: Mobility/safety with mobility;Balance OT goals addressed during session: ADL's and  self-care       AM-PAC PT "6 Clicks" Mobility  Outcome Measure Help needed turning from your back to your side while in a flat bed without using bedrails?: None Help needed moving from lying on your back to sitting on the side of a flat bed without using bedrails?: None Help needed moving to and from a bed to a chair (including a wheelchair)?: None Help needed standing up from a chair using your arms (e.g., wheelchair or bedside chair)?: None Help needed to walk in hospital room?: None Help needed climbing 3-5 steps with a railing? : None 6 Click Score: 24    End of Session   Activity Tolerance: Patient tolerated treatment well Patient left: in bed;with family/visitor present Nurse Communication: Mobility status PT Visit Diagnosis: Unsteadiness on feet (R26.81);Other abnormalities of gait and mobility (R26.89);Muscle weakness (generalized) (M62.81)    Time: 2956-2130 PT Time Calculation (min) (ACUTE ONLY): 18 min   Charges:   PT Evaluation $PT Eval Low Complexity: 1 Low PT Treatments $Therapeutic Activity: 8-22 mins PT General Charges $$ ACUTE PT VISIT: 1 Visit         11:27 AM, 05/21/23 Ocie Bob, MPT Physical Therapist with [facility] [phone] office [phone] mobile phone

## 2023-05-28 DIAGNOSIS — G464 Cerebellar stroke syndrome: Secondary | ICD-10-CM

## 2023-06-01 NOTE — Progress Notes (Unsigned)
Referring Provider: Elder Negus, NP Primary Care Physician:  Elder Negus, NP Primary GI Physician: Dr. Jena Gauss  Chief Complaint  Patient presents with   Follow-up    Follow up.     HPI:   Jason Mcmahon is a 57 y.o. male with history of GERD, adenomatous colon polyps due for repeat colonoscopy in 2026, PBC, presenting today for routine 6-month follow-up.  Last seen in the office in May 2024.  Clinically, he was doing well and advised to continue his current medications including ursodiol 500 mg twice daily, omeprazole 20 mg daily.  Would be due for routine labs and imaging in August.  Lab 01/28/2023 with HFP within normal limits.  CBC with no significant abnormalities.  RUQ ultrasound 01/29/2023 with increased echotexture of the liver, no focal liver lesion.   Patient was recently admitted to the hospital 11/18 - 11/19 with acute lacunar infarct in the left thalamus.  He was started on Plavix and atorvastatin with recommendations for outpatient 30-day event monitor, neurology follow-up.  He has not scheduled to see neurology until 10/22/2023.     Today: Doing well from a GI standpoint.  No abdominal pain, abdominal distention, lower extremity edema, yellowing of the eyes or skin, bruising, bleeding, BRBPR, melena, constipation, diarrhea. GERD well controlled on omeprazole 20 mg once a day.  Also continues to be compliant with ursodiol 500 mg twice daily.  CMP day of admission (11/18) with total bilirubin slightly bumped at 1.3, otherwise LFTs within normal limits.   Blood pressure significantly elevated today.  Has not taken blood pressure medication yet as he takes it in the evening.  No headache, blurry vision, chest pain.  Has not seen PCP since hospital discharge.  Has an appointment with neurology in April.  Past Medical History:  Diagnosis Date   Acid reflux    Gout    Hypertension    Primary biliary cholangitis (HCC)    Seizures (HCC)    started on Keppra in  April 2022    Past Surgical History:  Procedure Laterality Date   arm surgery     COLONOSCOPY N/A 02/28/2016   Surgeon: Franky Macho, MD; 2 mm tubular adenoma in the ascending colon, 8 mm tubular adenoma in the sigmoid colon, otherwise normal exam.  Repeat in 3 years.   COLONOSCOPY N/A 02/05/2020   Surgeon: Corbin Ade, MD;  Diverticulosis in the sigmoid and descending colon, otherwise normal exam, repeat in 5 years.   ESOPHAGOGASTRODUODENOSCOPY N/A 02/22/2021   Surgeon: Corbin Ade, MD;  Single 5 mm erosion straddling GE junction, otherwise normal esophagus, small hiatal hernia, otherwise normal exam.    Current Outpatient Medications  Medication Sig Dispense Refill   allopurinol (ZYLOPRIM) 300 MG tablet Take 300 mg by mouth daily.     atorvastatin (LIPITOR) 40 MG tablet Take 1 tablet (40 mg total) by mouth daily. 90 tablet 3   Cholecalciferol (VITAMIN D3) 50 MCG (2000 UT) capsule Take 2 capsules (4,000 Units total) by mouth daily. (Patient taking differently: Take 2,000 Units by mouth daily.) 60 capsule 2   clopidogrel (PLAVIX) 75 MG tablet Take 1 tablet (75 mg total) by mouth daily. 90 tablet 3   irbesartan (AVAPRO) 150 MG tablet Take 1 tablet (150 mg total) by mouth daily. 90 tablet 3   levETIRAcetam 1000 MG TB24 Take 1,000 mg by mouth at bedtime. 90 tablet 3   omeprazole (PRILOSEC) 20 MG capsule TAKE 1 CAPSULE(20 MG) BY MOUTH TWICE DAILY BEFORE MEALS (Patient  taking differently: Take 20 mg by mouth daily.) 60 capsule 5   sildenafil (VIAGRA) 25 MG tablet Take 25-100 mg by mouth daily as needed for erectile dysfunction.     ursodiol (ACTIGALL) 500 MG tablet TAKE 1 TABLET(500 MG) BY MOUTH TWICE DAILY (Patient taking differently: Take 500 mg by mouth in the morning and at bedtime.) 180 tablet 3   febuxostat (ULORIC) 40 MG tablet Take by mouth. (Patient not taking: Reported on 05/20/2023)     No current facility-administered medications for this visit.    Allergies as of  06/03/2023 - Review Complete 06/03/2023  Allergen Reaction Noted   Aspirin Anaphylaxis 10/11/2020   Lisinopril Cough 07/19/2021    Family History  Problem Relation Age of Onset   Liver disease Father        Alcoholic   Hypertension Father    Cancer Father    Lung cancer Father    Diabetes Paternal Grandmother    Seizures Paternal Aunt    Colon cancer Neg Hx     Social History   Socioeconomic History   Marital status: Married    Spouse name: Not on file   Number of children: Not on file   Years of education: Not on file   Highest education level: Not on file  Occupational History   Not on file  Tobacco Use   Smoking status: Former    Current packs/day: 3.00    Average packs/day: 3.0 packs/day for 18.0 years (54.0 ttl pk-yrs)    Types: Cigarettes   Smokeless tobacco: Never  Vaping Use   Vaping status: Never Used  Substance and Sexual Activity   Alcohol use: No   Drug use: No   Sexual activity: Not Currently  Other Topics Concern   Not on file  Social History Narrative   Not on file   Social Determinants of Health   Financial Resource Strain: Low Risk  (04/12/2023)   Received from [facility]   Overall Financial Resource Strain (CARDIA)    Difficulty of Paying Living Expenses: Not hard at all  Food Insecurity: No Food Insecurity (05/20/2023)   Hunger Vital Sign    Worried About Running Out of Food in the Last Year: Never true    Ran Out of Food in the Last Year: Never true  Transportation Needs: No Transportation Needs (05/20/2023)   PRAPARE - Administrator, Civil Service (Medical): No    Lack of Transportation (Non-Medical): No  Physical Activity: Insufficiently Active (04/12/2023)   Received from [facility]   Exercise Vital Sign    Days of Exercise per Week: 1 day    Minutes of Exercise per Session: 60 min  Stress: No Stress Concern Present (04/12/2023)   Received from [facility] -  Occupational Stress Questionnaire    Feeling of Stress : Not at all  Social Connections: Socially Integrated (04/12/2023)   Received from [facility]   Social Network    How would you rate your social network (family, work, friends)?: Good participation with social networks    Review of Systems: Gen: Denies fever, chills, cold or flulike symptoms, presyncope, syncope, headache, vision changes. CV: Denies chest pain, palpitations. Resp: Denies dyspnea, cough. GI: See HPI Heme:  See HPI  Physical Exam: BP (!) 173/120   Pulse (!) 109   Temp 98.4 F (36.9 C) (Temporal)   Ht 5\' 8"  (1.727 m)   Wt 191 lb 3.2 oz (86.7 kg)  BMI 29.07 kg/m  General:   Alert and oriented. No distress noted. Pleasant and cooperative.  Head:  Normocephalic and atraumatic. Eyes:  Conjuctiva clear without scleral icterus. Heart:  S1, S2 present without murmurs appreciated. Lungs:  Clear to auscultation bilaterally. No wheezes, rales, or rhonchi. No distress.  Abdomen:  +BS, soft, non-tender and non-distended. No rebound or guarding. No HSM or masses noted. Msk:  Symmetrical without gross deformities. Normal posture. Extremities:  Without edema. Neurologic:  Alert and  oriented x4 Psych:  Normal mood and affect.    Assessment:  57 year old male with history of GERD, adenomatous colon polyps due for surveillance colonoscopy in 2026, PBC, HTN, seizures, recent lacunar infarct  PBC: AMA and ASMA positive.  Underwent liver biopsy in 2020 with findings compatible with PBC though not diagnostic, possible overlap autoimmune hepatitis or drug-induced liver injury also considered.   Maintained on ursodiol 500 mg twice daily.  Clinically, doing very well.  No alcohol intake.  Liver enzymes normalized May 2022.  Recent slight bump in total bilirubin to 1.3 on 05/20/23, day of admission for stroke.  We will recheck his LFTs at this time.   Due to chronic mild thrombocytopenia and mildly elevated kPa at 9.6 in  January 2022, there was some concern for early cirrhosis and EGD was pursued in August 2022, but did not eveal any varices or portal hypertensive gastropathy.  Dr. Jena Gauss recommended clinical progress including labs and imaging findings should guide future endoscopy.  Continues with very slight thrombocytopenia, platelets 146 in November.  Ultrasound up-to-date as of July 2024 with no overt cirrhosis.  He will be due for routine ultrasound in February.  Currently overdue for DEXA, but holding off for now at patient's request.  Last DEXA was in July 2022 and was normal.  Lipid panel normal earlier this year. Due for TSH in January, but as we are updating HFP now, will go ahead and add on TSH.     GERD: Well-controlled on omeprazole 20 mg daily.  HTN: Uncontrolled.  Blood pressure persistently greater than 140/90.  Last blood pressure check was 173/120.  Patient has not taken his blood pressure medication yet as he usually takes it in the afternoon.  Currently asymptomatic.  Considering recent stroke, discussed ER evaluation, but patient states he will go home, take his blood pressure medication, then recheck his blood pressure.  Advised if his blood pressure remains greater than 160/90, he should proceed to the emergency room to be evaluated and for blood pressure to be lowered.  Otherwise, advised to call PCP first thing in the morning for ASAP appointment.   Plan:  HFP, TSH RUQ ultrasound February 2025. Continue ursodiol 500 mg twice daily. Continue omeprazole 20 mg daily. Recommended patient take blood pressure medication as soon as he gets home and recheck blood pressure in 30 minutes.  If BP remains greater than 160/90, recommended ER evaluation today.  Otherwise, needs to call PCP first thing in the morning to schedule appointment ASAP. Follow-up in 6 months or sooner if needed.   Ermalinda Memos, PA-C [facility] 06/03/2023

## 2023-06-03 ENCOUNTER — Ambulatory Visit (INDEPENDENT_AMBULATORY_CARE_PROVIDER_SITE_OTHER): Payer: 59 | Admitting: Gastroenterology

## 2023-06-03 ENCOUNTER — Encounter: Payer: Self-pay | Admitting: Gastroenterology

## 2023-06-03 VITALS — BP 173/120 | HR 109 | Temp 98.4°F | Ht 68.0 in | Wt 191.2 lb

## 2023-06-03 DIAGNOSIS — K743 Primary biliary cirrhosis: Secondary | ICD-10-CM

## 2023-06-03 DIAGNOSIS — D696 Thrombocytopenia, unspecified: Secondary | ICD-10-CM

## 2023-06-03 DIAGNOSIS — K219 Gastro-esophageal reflux disease without esophagitis: Secondary | ICD-10-CM

## 2023-06-03 DIAGNOSIS — Z87891 Personal history of nicotine dependence: Secondary | ICD-10-CM | POA: Diagnosis not present

## 2023-06-03 DIAGNOSIS — I10 Essential (primary) hypertension: Secondary | ICD-10-CM | POA: Diagnosis not present

## 2023-06-03 NOTE — Patient Instructions (Addendum)
Please have blood work completed at [facility] in the next week or so to recheck your liver enzymes and also check your thyroid function.  You will be due for an ultrasound in February 2025.  We will get this scheduled for you closer to time.  Continue omeprazole 20 mg daily.  Continue ursodiol 500 mg twice daily.  Please take your blood pressure medication as soon as you get home.  Recheck your blood pressure about 30 minutes later.  If your blood pressure remains greater than 160/90, would recommend that you are evaluated today to get your blood pressure down considering your recent stroke.  Regardless, you need to call your primary care doctor first thing in the morning to schedule an appointment ASAP for blood pressure management.  I will plan to see back in the office in 6 months or sooner if needed.  Ermalinda Memos, PA-C [facility]

## 2023-06-04 ENCOUNTER — Ambulatory Visit: Payer: 59 | Attending: Internal Medicine

## 2023-06-10 ENCOUNTER — Other Ambulatory Visit: Payer: Self-pay | Admitting: Internal Medicine

## 2023-06-10 NOTE — Telephone Encounter (Signed)
Seen Baxter Hire last

## 2023-06-11 ENCOUNTER — Encounter: Payer: Self-pay | Admitting: Internal Medicine

## 2023-06-11 ENCOUNTER — Other Ambulatory Visit: Payer: Self-pay | Admitting: Gastroenterology

## 2023-06-11 DIAGNOSIS — K219 Gastro-esophageal reflux disease without esophagitis: Secondary | ICD-10-CM

## 2023-06-11 MED ORDER — PANTOPRAZOLE SODIUM 20 MG PO TBEC
20.0000 mg | DELAYED_RELEASE_TABLET | Freq: Every day | ORAL | 5 refills | Status: DC
Start: 2023-06-11 — End: 2023-06-12

## 2023-06-11 NOTE — Telephone Encounter (Signed)
Due to Plavix, we need to change omeprazole to pantoprazole 20 mg daily.  I will send the new prescription to his pharmacy.

## 2023-06-12 ENCOUNTER — Other Ambulatory Visit: Payer: Self-pay | Admitting: Gastroenterology

## 2023-06-12 DIAGNOSIS — K219 Gastro-esophageal reflux disease without esophagitis: Secondary | ICD-10-CM

## 2023-06-12 MED ORDER — PANTOPRAZOLE SODIUM 20 MG PO TBEC: 20.0000 mg | DELAYED_RELEASE_TABLET | Freq: Every day | ORAL | 5 refills | Status: DC

## 2023-06-12 NOTE — Telephone Encounter (Signed)
Pt made aware of his Rx through his MyChart

## 2023-06-15 LAB — HEPATIC FUNCTION PANEL
AG Ratio: 1.6 (calc) (ref 1.0–2.5)
ALT: 31 U/L (ref 9–46)
AST: 26 U/L (ref 10–35)
Albumin: 4.2 g/dL (ref 3.6–5.1)
Alkaline phosphatase (APISO): 130 U/L (ref 35–144)
Bilirubin, Direct: 0.3 mg/dL — ABNORMAL HIGH (ref 0.0–0.2)
Globulin: 2.6 g/dL (ref 1.9–3.7)
Indirect Bilirubin: 0.5 mg/dL (ref 0.2–1.2)
Total Bilirubin: 0.8 mg/dL (ref 0.2–1.2)
Total Protein: 6.8 g/dL (ref 6.1–8.1)

## 2023-06-15 LAB — TSH: TSH: 1.64 m[IU]/L (ref 0.40–4.50)

## 2023-07-17 IMAGING — US US ABDOMEN LIMITED
1 series · 14 of 25 positions shown · non-contrast
Comparison: Abdominal ultrasound 01/12/2021

CLINICAL DATA: Primary biliary cholangitis

EXAM:
ULTRASOUND ABDOMEN LIMITED RIGHT UPPER QUADRANT

[Series 1: us abdomen limited ruq (liver/gb) · 14 of 91 slices shown]
[im 1/91]
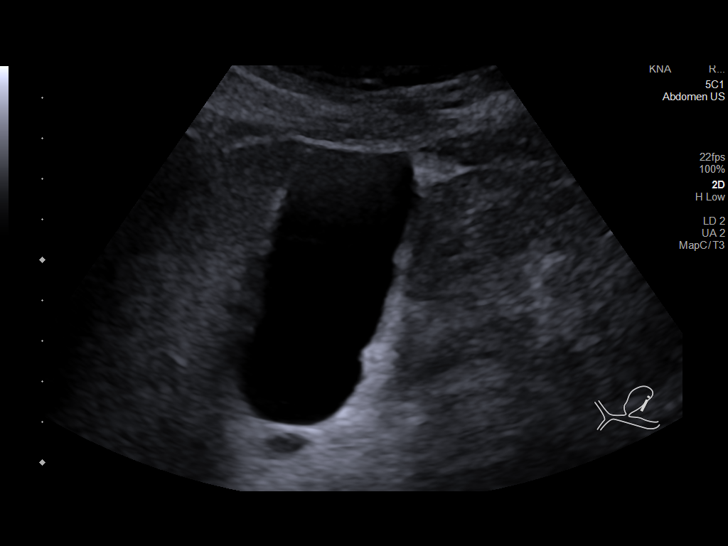
[im 8/91]
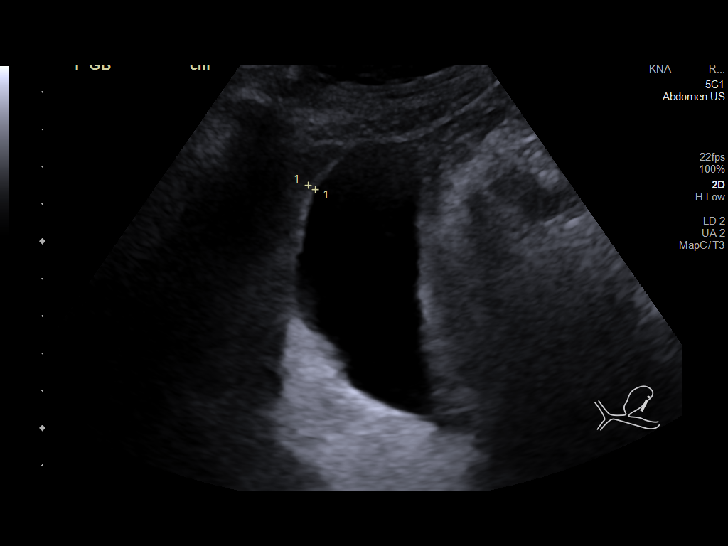
[im 16/91]
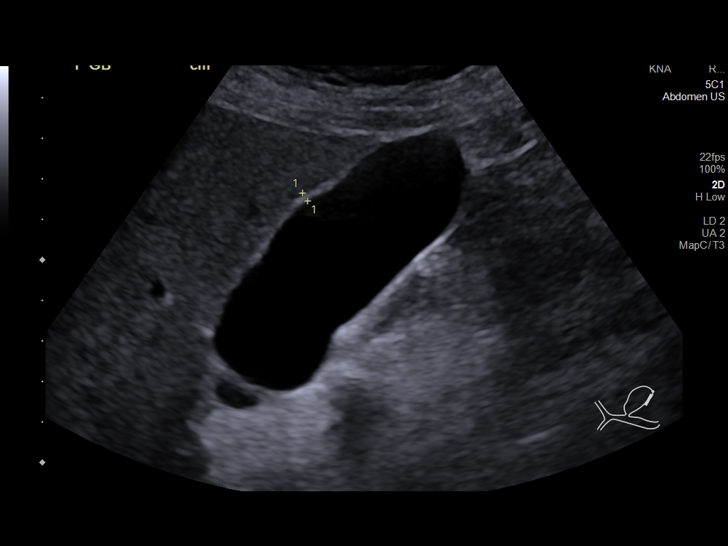
[im 23/91]
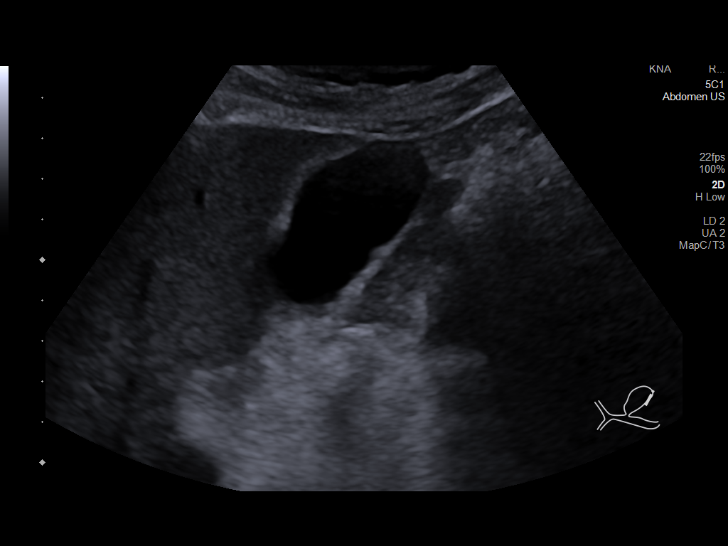
[im 31/91]
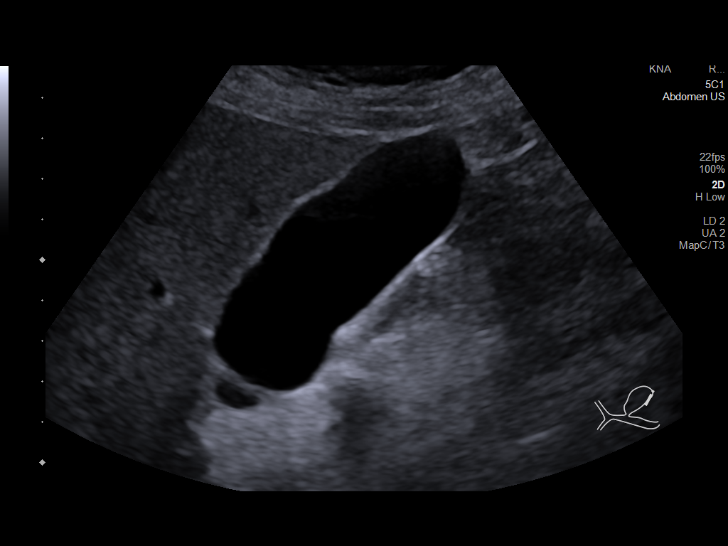
[im 34/91]
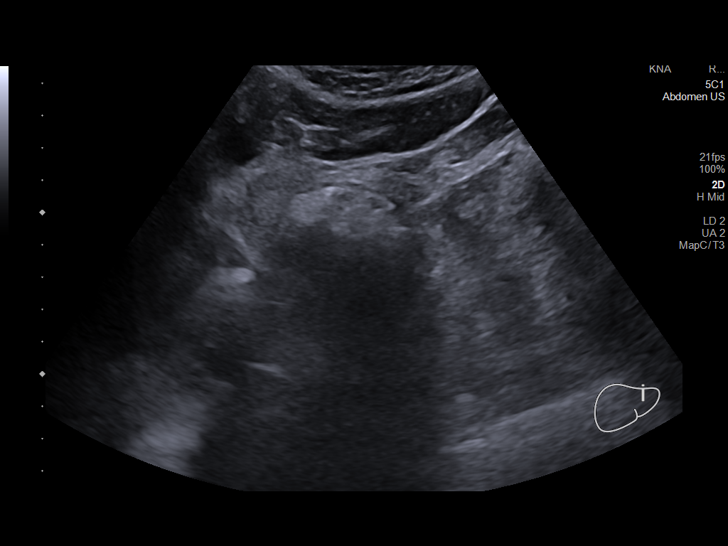
[im 42/91]
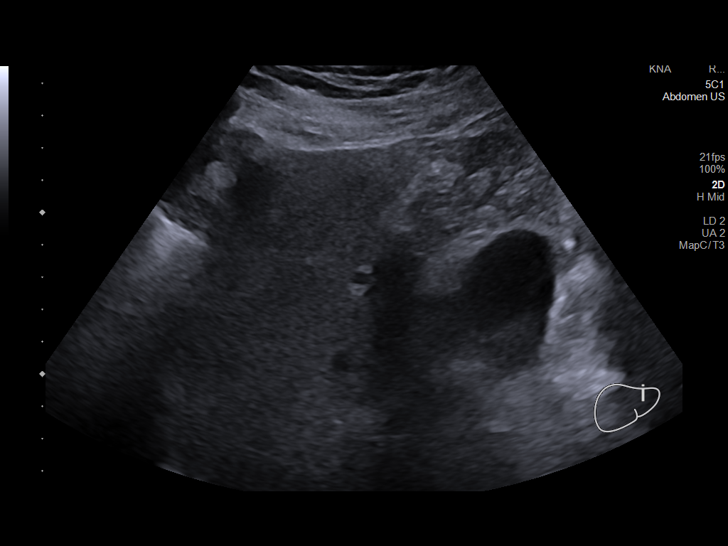
[im 49/91]
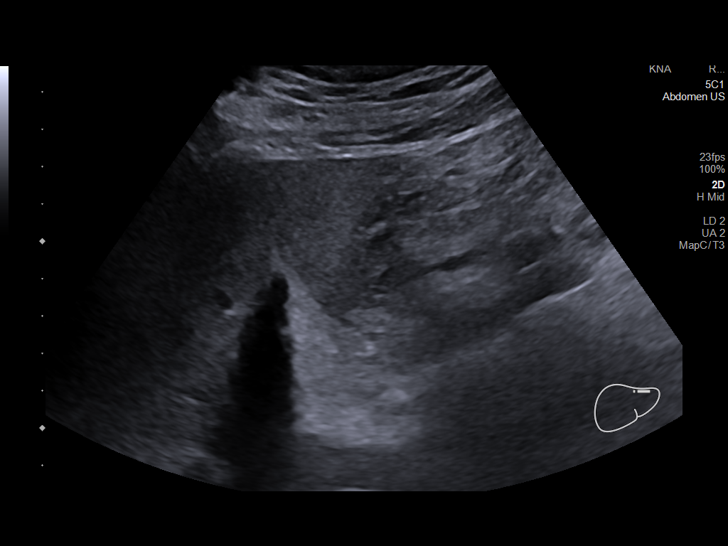
[im 57/91]
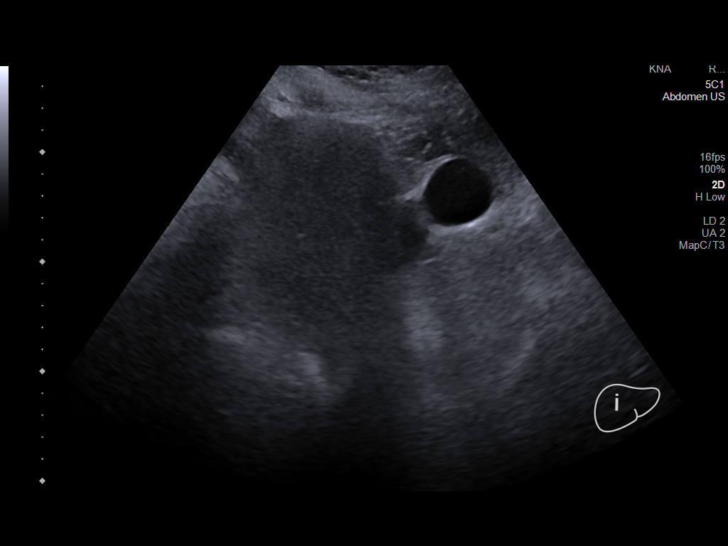
[im 61/91]
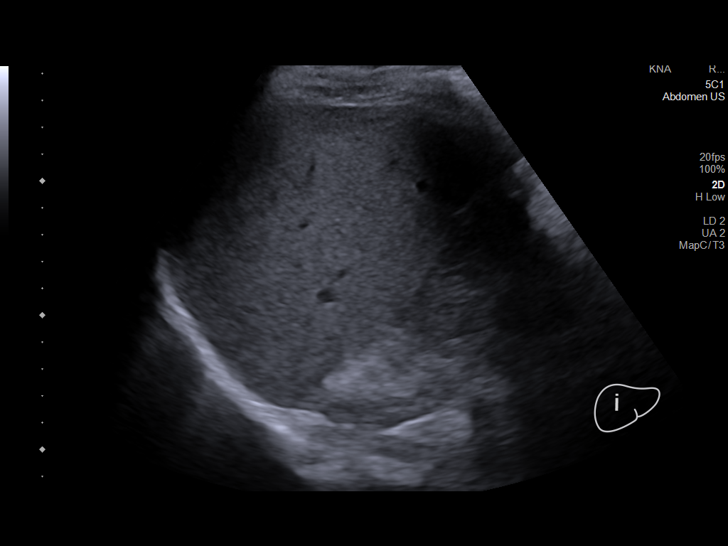
[im 68/91]
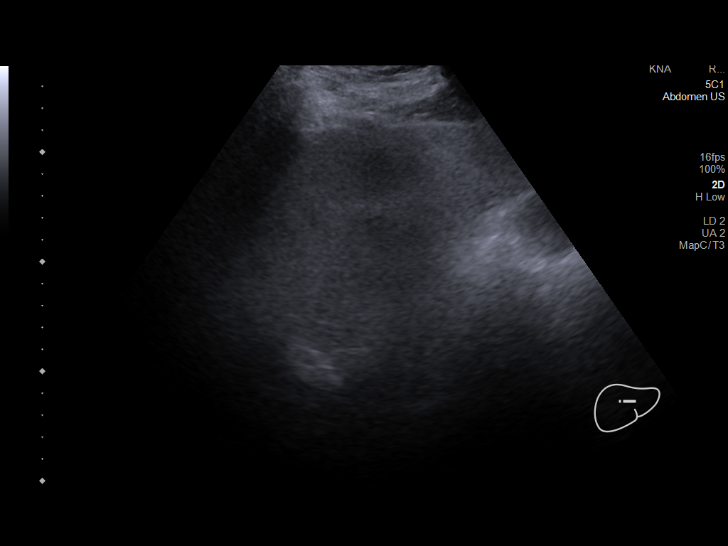
[im 76/91]
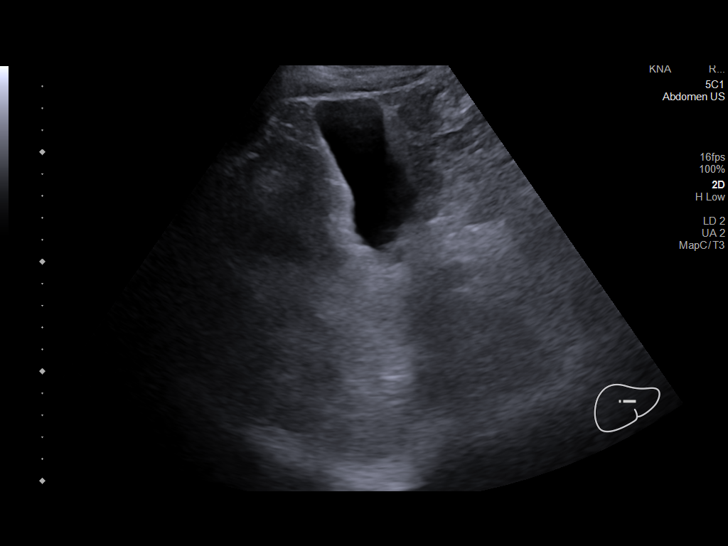
[im 83/91]
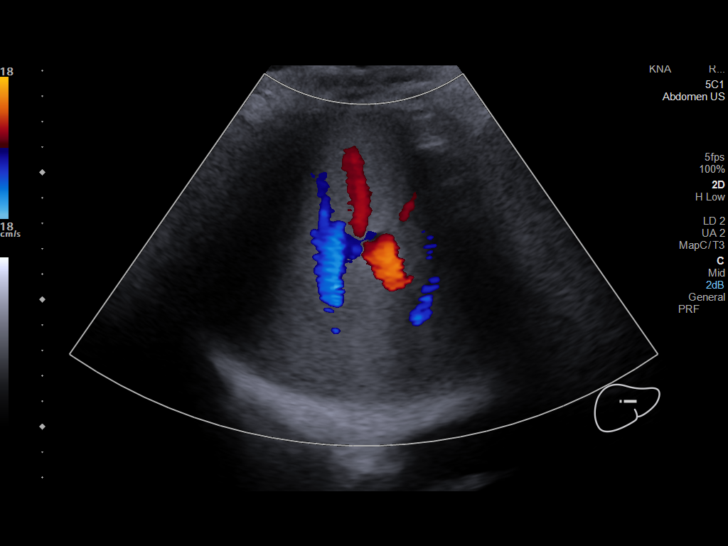
[im 91/91]
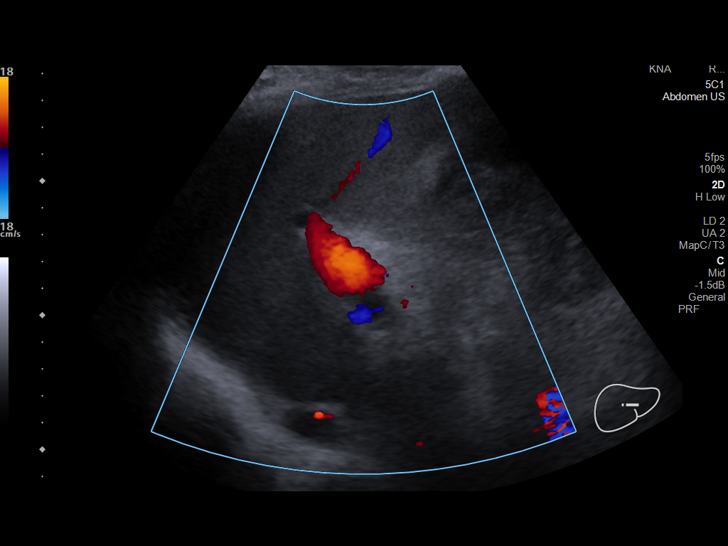

[14 of 25 positions shown; findings below may reference images not displayed]

FINDINGS: Gallbladder:

No gallstones or wall thickening visualized. No pericholecystic
fluid. No sonographic Murphy sign noted by sonographer.

Common bile duct:

Diameter: 4 mm

Liver:

No focal lesion identified. There is moderately increased
echogenicity and attenuation of the liver again compatible with
fatty infiltration. Smooth liver contours. Portal vein is patent on
color Doppler imaging with normal direction of blood flow towards
the liver.

Other: None.
IMPRESSION: Fatty infiltration of the liver.

No sonographic evidence of acute cholecystitis.

## 2023-08-11 ENCOUNTER — Other Ambulatory Visit: Payer: Self-pay | Admitting: Internal Medicine

## 2023-08-11 DIAGNOSIS — K743 Primary biliary cirrhosis: Secondary | ICD-10-CM

## 2023-09-24 ENCOUNTER — Encounter: Payer: Self-pay | Admitting: *Deleted

## 2023-09-24 ENCOUNTER — Telehealth: Payer: Self-pay | Admitting: Gastroenterology

## 2023-09-24 ENCOUNTER — Other Ambulatory Visit: Payer: Self-pay | Admitting: *Deleted

## 2023-09-24 DIAGNOSIS — R748 Abnormal levels of other serum enzymes: Secondary | ICD-10-CM

## 2023-09-24 DIAGNOSIS — K743 Primary biliary cirrhosis: Secondary | ICD-10-CM

## 2023-09-24 NOTE — Addendum Note (Signed)
 Addended by: Armstead Peaks on: 09/24/2023 04:44 PM   Modules accepted: Orders

## 2023-09-24 NOTE — Telephone Encounter (Signed)
 Noted.

## 2023-09-24 NOTE — Telephone Encounter (Signed)
 Sent pt a [facility] [facility]. Labs entered into Epic.

## 2023-09-24 NOTE — Telephone Encounter (Signed)
 Received labs from PCP dated 09/17/23. Alk phos had bumped up to 164, bilirubin 0.7 and AST and ALT also wnl.   Toni Amend, please let patient know I received his labs from PCP dated 3/18. His alk phos is elevated at 164, previously normal at 130 in December. Not sure if this is a transient bump or representing worsening liver disease vs other cause. Recommend rechecking HFP Around April 15th FASTNING (please arrange). If alk phos remains elevated, we will arrange an OV to discuss further.   Mindy/Tammy: Patient is due for routine RUQ [place] due to history of PBC. Can we get this arranged?

## 2023-10-02 ENCOUNTER — Other Ambulatory Visit: Payer: Self-pay | Admitting: *Deleted

## 2023-10-02 DIAGNOSIS — R748 Abnormal levels of other serum enzymes: Secondary | ICD-10-CM

## 2023-10-03 ENCOUNTER — Ambulatory Visit (HOSPITAL_COMMUNITY)
Admission: RE | Admit: 2023-10-03 | Discharge: 2023-10-03 | Disposition: A | Discharge status: Home / Self Care | Source: Ambulatory Visit | Attending: Gastroenterology | Admitting: Gastroenterology

## 2023-10-03 DIAGNOSIS — K743 Primary biliary cirrhosis: Secondary | ICD-10-CM | POA: Diagnosis present

## 2023-10-09 ENCOUNTER — Encounter: Payer: Self-pay | Admitting: Gastroenterology

## 2023-10-16 LAB — HEPATIC FUNCTION PANEL
ALT: 15 IU/L (ref 0–44)
AST: 18 IU/L (ref 0–40)
Albumin: 4.4 g/dL (ref 3.8–4.9)
Alkaline Phosphatase: 152 IU/L — ABNORMAL HIGH (ref 44–121)
Bilirubin Total: 1.1 mg/dL (ref 0.0–1.2)
Bilirubin, Direct: 0.46 mg/dL — ABNORMAL HIGH (ref 0.00–0.40)
Total Protein: 7.3 g/dL (ref 6.0–8.5)

## 2023-10-17 ENCOUNTER — Ambulatory Visit: Payer: 59 | Admitting: Family Medicine

## 2023-10-22 ENCOUNTER — Ambulatory Visit (INDEPENDENT_AMBULATORY_CARE_PROVIDER_SITE_OTHER): Payer: 59 | Admitting: Family Medicine

## 2023-10-22 ENCOUNTER — Encounter: Payer: Self-pay | Admitting: Family Medicine

## 2023-10-22 ENCOUNTER — Other Ambulatory Visit: Payer: Self-pay | Admitting: *Deleted

## 2023-10-22 VITALS — BP 141/80 | HR 92 | Ht 68.5 in | Wt 185.8 lb

## 2023-10-22 DIAGNOSIS — G40909 Epilepsy, unspecified, not intractable, without status epilepticus: Secondary | ICD-10-CM

## 2023-10-22 DIAGNOSIS — G4733 Obstructive sleep apnea (adult) (pediatric): Secondary | ICD-10-CM

## 2023-10-22 MED ORDER — LEVETIRACETAM ER 1000 MG PO TB24: 1000.0000 mg | ORAL_TABLET | Freq: Every day | ORAL | 3 refills | Status: DC

## 2023-10-22 NOTE — Progress Notes (Unsigned)
 Chief Complaint  Patient presents with   Follow-up    Pt in room 2. Alone.  Here for seizure follow up. Pt reports stroke in Nov. No recent seizures. Pt still has right hand numbness, has improved.     HISTORY OF PRESENT ILLNESS:  10/24/23 ALL:  Jason Mcmahon returns for follow up for seizures. He was last seen 10/2022 and doing well on lev ER 1000mg  daily. Since, he reports doing well. No seizure activity. He continues to gamble and not concerns with being in a casino. He does not drink.   He was hospitalized 05/20/2023 following MRI confirmed left thalamus lacunar infarct. He woke with left upper ext and right facial numbness. MRA showed intracranial atherosclerosis including 50% right ICA and high-grade right A1 stenoses. Carotid doppler showed mild calcified plaque of right ICA, measurement consistent with less than 50% stenosis. EF 75% with no significant stenosis. No evidence of interarterial shunt. EKG NSR. LDL was 86. A1C 5.0. He was started on Plavix  (intolerance to asa) and atorvastatin . He has continued the medications and reports tolerating well. Slight elevation in alk phos being monitored by GI. S/P Follow up scheduled in May. 30 day cardiac monitor reportedly normal. No outpatient therapy advised.   He was diagnosed with sleep apnea in 2022. He reports not being able to tolerate mask. He is not interested in another sleep study for eval. He does not feel he would be interested in an oral appliance and hasn't thought much about Inspire.   He does not drink. Former smoker.   10/17/2022 ALL:  Jason Mcmahon returns for follow up for seizures. He continues levetiracetam  ER 1000mg  daily. He is tolerating med well. No seizure activity. He is sleeping better. He stays well hydrated. He has returned to casino without any seizure activity. He continues close follow up with hepatology. He drives without difficulty.   10/12/2021 ALL: Jason Mcmahon is a 58 y.o. male here today for follow up for seizures.  He continues levetiracetam  XR 1000mg  daily. He is tolerating medication well without obvious adverse effects. Last seizure about a year ago. He has had two seizures (2020 and 2022), both in a casino. He has returned to casino multiple times since without any seizure activity. He is driving without difficulty. He works full time.   He does not wish to resume CPAP. He feels that he sleeps well. He reports getting about 9 hours every night. He continues close follow up with hepatology for PBC. Labs are stable. He continues ursodiol  500mg  BID. He maintains healthy lifestyle habits. He does not drink alcohol. He does not smoke. He lives an active lifestyle.   04/12/2021 SA: Jason Mcmahon is a 58 year old right-hand gentleman with an underlying medical history of hypertension, gout, and overweight state, who presents for follow-up consultation of his seizure disorder.  He is unaccompanied today (wife brought him, she is in the parking lot with their grandbaby).  I last saw him on 12/06/2020, at which time he reported sleeping well.  We talked about his home sleep test results from 11/29/2020 indicating mild to near moderate obstructive sleep apnea by number of events with an AHI of 14.6/h, O2 nadir 88%.  He was advised to consider treatment with AutoPap.  He declined treatment at the time.  He was advised to continue with his Keppra  for seizure prevention.  He indicated no recent seizures.   Today, 04/12/2021: He reports doing well, no recent seizures, no seizure-like episodes, no new complaints, no neurological symptoms, trying  to hydrate well, has been drinking more water  and has lost a little bit of weight, working on weight loss.  He has been working full-time in the office, he makes computer chips.  He had some accommodation from his boss and was able to work from home on Thursdays.  He generally works Sundays through Thursdays every week.  He is hoping to resume driving as of today, as he will be 6 months out from  his last seizure.  He denies any recurrent headaches or difficulty tolerating the Keppra , reports full compliance.  He has been sleeping fairly well.  He has regular checkup with his liver specialist.  He had a change in his GI specialist as his original doctor left the practice.  He had an ultrasound and blood work recently.  Ultrasound of the abdomen on 01/04/2021 showed:IMPRESSION: Probable fatty infiltration of liver as above.   Splenomegaly, not significantly changed.   Remainder of exam unremarkable.   He does not drink any alcohol, he limits his caffeine.  07/13/19 ALL:  Jason Mcmahon is a 58 y.o. male here today for follow up for seizure. He had a sigle, unprovoked seizure on 01/17/2019. Workup has been unremarkable. He was diagnosed with primary biliary cholangitis following seizure. He has worked on healthy lifestyle habits. He has stopped drinking sodas. He limits red meats to once every two weeks. He is eating more fish. He continues to work full time. He denies any seizure like activity since last being seen. He was never started on AED's. He is feeling well today. He does note elevated BP in the setting of white coat syndrome. He reports that BP is checked regularly at home and usually 130's/80's. He takes lisinopril  20mg  at bedtime.   REVIEW OF SYSTEMS: Out of a complete 14 system review of symptoms, the patient complains only of the following symptoms, none and all other reviewed systems are negative.   ALLERGIES: Allergies  Allergen Reactions   Aspirin Anaphylaxis   Lisinopril  Cough     HOME MEDICATIONS: Outpatient Medications Prior to Visit  Medication Sig Dispense Refill   allopurinol  (ZYLOPRIM ) 300 MG tablet Take 300 mg by mouth daily.     atorvastatin  (LIPITOR) 40 MG tablet Take 1 tablet (40 mg total) by mouth daily. 90 tablet 3   Cholecalciferol (VITAMIN D3) 50 MCG (2000 UT) capsule Take 2 capsules (4,000 Units total) by mouth daily. (Patient taking differently: Take  2,000 Units by mouth daily.) 60 capsule 2   clopidogrel  (PLAVIX ) 75 MG tablet Take 1 tablet (75 mg total) by mouth daily. 90 tablet 3   irbesartan  (AVAPRO ) 150 MG tablet Take 1 tablet (150 mg total) by mouth daily. 90 tablet 3   pantoprazole  (PROTONIX ) 20 MG tablet Take 1 tablet (20 mg total) by mouth daily. 30 tablet 5   sildenafil (VIAGRA) 25 MG tablet Take 25-100 mg by mouth daily as needed for erectile dysfunction.     ursodiol  (ACTIGALL ) 500 MG tablet TAKE 1 TABLET(500 MG) BY MOUTH TWICE DAILY 180 tablet 3   levETIRAcetam  1000 MG TB24 Take 1,000 mg by mouth at bedtime. 90 tablet 3   febuxostat  (ULORIC ) 40 MG tablet Take by mouth. (Patient not taking: Reported on 05/20/2023)     No facility-administered medications prior to visit.     PAST MEDICAL HISTORY: Past Medical History:  Diagnosis Date   Acid reflux    Gout    Hypertension    Primary biliary cholangitis (HCC)    Seizures (HCC)  started on Keppra  in April 2022   Stroke [facility])    Nov 2024     PAST SURGICAL HISTORY: Past Surgical History:  Procedure Laterality Date   arm surgery     COLONOSCOPY N/A 02/28/2016   Surgeon: Alanda Allegra, MD; 2 mm tubular adenoma in the ascending colon, 8 mm tubular adenoma in the sigmoid colon, otherwise normal exam.  Repeat in 3 years.   COLONOSCOPY N/A 02/05/2020   Surgeon: Suzette Espy, MD;  Diverticulosis in the sigmoid and descending colon, otherwise normal exam, repeat in 5 years.   ESOPHAGOGASTRODUODENOSCOPY N/A 02/22/2021   Surgeon: Suzette Espy, MD;  Single 5 mm erosion straddling GE junction, otherwise normal esophagus, small hiatal hernia, otherwise normal exam.     FAMILY HISTORY: Family History  Problem Relation Age of Onset   Liver disease Father        Alcoholic   Hypertension Father    Cancer Father    Lung cancer Father    Diabetes Paternal Grandmother    Seizures Paternal Aunt    Colon cancer Neg Hx      SOCIAL HISTORY: Social History    Socioeconomic History   Marital status: Married    Spouse name: Not on file   Number of children: Not on file   Years of education: Not on file   Highest education level: Not on file  Occupational History   Not on file  Tobacco Use   Smoking status: Former    Current packs/day: 3.00    Average packs/day: 3.0 packs/day for 18.0 years (54.0 ttl pk-yrs)    Types: Cigarettes   Smokeless tobacco: Never  Vaping Use   Vaping status: Never Used  Substance and Sexual Activity   Alcohol use: No   Drug use: No   Sexual activity: Not Currently  Other Topics Concern   Not on file  Social History Narrative   Not on file   Social Drivers of Health   Financial Resource Strain: Low Risk  (07/17/2023)   Received from [facility]   Overall Financial Resource Strain (CARDIA)    Difficulty of Paying Living Expenses: Not hard at all  Food Insecurity: No Food Insecurity (07/17/2023)   Received from [facility]   Hunger Vital Sign    Worried About Running Out of Food in the Last Year: Never true    Ran Out of Food in the Last Year: Never true  Transportation Needs: No Transportation Needs (07/17/2023)   Received from [facility] - Transportation    Lack of Transportation (Medical): No    Lack of Transportation (Non-Medical): No  Physical Activity: Insufficiently Active (04/12/2023)   Received from [facility]   Exercise Vital Sign    Days of Exercise per Week: 1 day    Minutes of Exercise per Session: 60 min  Stress: No Stress Concern Present (04/12/2023)   Received from [facility] - Occupational Stress Questionnaire    Feeling of Stress : Not at all  Social Connections: Socially Integrated (04/12/2023)   Received from [facility]   Social Network    How would you rate your social network (family, work, friends)?: Good participation with social networks  Intimate Partner Violence: Not At Risk (05/20/2023)   Humiliation,  Afraid, Rape, and Kick questionnaire    Fear of Current or Ex-Partner: No    Emotionally Abused: No    Physically Abused: No    Sexually Abused: No  PHYSICAL EXAM  Vitals:   10/22/23 1417  BP: (!) 141/80  Pulse: 92  Weight: 185 lb 12.8 oz (84.3 kg)  Height: 5' 8.5" (1.74 m)     Body mass index is 27.84 kg/m.  Generalized: Well developed, in no acute distress  Cardiology: normal rate and rhythm, no murmur auscultated  Respiratory: clear to auscultation bilaterally    Neurological examination  Mentation: Alert oriented to time, place, history taking. Follows all commands speech and language fluent Cranial nerve II-XII: Pupils were equal round reactive to light. Extraocular movements were full, visual field were full on confrontational test. Facial sensation and strength were normal. Head turning and shoulder shrug  were normal and symmetric. Motor: The motor testing reveals 5 over 5 strength of all 4 extremities. Good symmetric motor tone is noted throughout.  Gait and station: Gait is normal.   DIAGNOSTIC DATA (LABS, IMAGING, TESTING) - I reviewed patient records, labs, notes, testing and imaging myself where available.  Lab Results  Component Value Date   WBC 3.8 (L) 05/20/2023   HGB 14.6 05/20/2023   HCT 43.0 05/20/2023   MCV 97.7 05/20/2023   PLT 146 (L) 05/20/2023      Component Value Date/Time   NA 140 05/20/2023 0831   NA 145 (H) 11/01/2020 1422   K 4.0 05/20/2023 0831   CL 104 05/20/2023 0831   CO2 27 05/20/2023 0818   GLUCOSE 87 05/20/2023 0831   BUN 16 05/20/2023 0831   BUN 16 11/01/2020 1422   CREATININE 1.30 (H) 05/20/2023 0831   CREATININE 1.31 07/07/2020 1648   CALCIUM  9.3 05/20/2023 0818   PROT 7.3 10/15/2023 1339   ALBUMIN 4.4 10/15/2023 1339   AST 18 10/15/2023 1339   ALT 15 10/15/2023 1339   ALKPHOS 152 (H) 10/15/2023 1339   BILITOT 1.1 10/15/2023 1339   GFRNONAA >60 05/20/2023 0818   GFRAA >60 06/04/2019 0741   Lab Results   Component Value Date   CHOL 138 05/20/2023   HDL 27 (L) 05/20/2023   LDLCALC 86 05/20/2023   TRIG 125 05/20/2023   CHOLHDL 5.1 05/20/2023   Lab Results  Component Value Date   HGBA1C 5.0 05/20/2023   No results found for: "VITAMINB12" Lab Results  Component Value Date   TSH 1.64 06/14/2023        No data to display               No data to display           ASSESSMENT AND PLAN  58 y.o. year old male  has a past medical history of Acid reflux, Gout, Hypertension, Primary biliary cholangitis (HCC), Seizures (HCC), and Stroke (HCC). here with    Seizure disorder (HCC)  Mild obstructive sleep apnea  Jason Mcmahon is doing very well, today. No seizure activity since early 2022. We will continue levetiracetam  XR 1000mg  QHS. We have reviewed notes from hospitalization following left thalamus lacunar infarct 05/2023. Likely due to small vessel disease. He denies any recurrent stroke like symptoms. We have discussed risk factors and goals given. He is not interested in treating OSA. He will call me if he changes his mind. Stroke and sleep apnea education provided in AVS. He will continue close follow up with care team. Healthy lifestyle habits encouraged. He will return to see me in 1 year, sooner if needed. He verbalizes understanding and agreement with this plan.   No orders of the defined types were placed in this encounter.  Meds ordered this encounter  Medications   DISCONTD: levETIRAcetam  ER 1000 MG TB24    Sig: Take 1,000 mg by mouth at bedtime.    Dispense:  90 tablet    Refill:  3    Supervising Provider:   Glory Larsen [[id number]]     Terrilyn Fick, MSN, FNP-C 10/24/2023, 8:19 AM  [facility] [address], [address] [place], [place] [address] [phone]

## 2023-10-22 NOTE — Patient Instructions (Signed)
 Below is our plan:  We will continue levetiracetam  ER 1000mg  daily.   Goals:  1) Maintain strict control of hypertension with blood pressure goal below 130/90 2) Maintain good control of diabetes with hemoglobin A1c goal below 7%  3) Maintain good control of lipids with LDL cholesterol goal below 70 mg/dL.  4) Eat a healthy diet with plenty of whole grains, cereals, fruits and vegetables, exercise regularly and maintain ideal body weight   Resources: https://www.williams.biz/  Please make sure you are consistent with timing of seizure medication. I recommend annual visit with primary care provider (PCP) for complete physical and routine blood work. I recommend daily intake of vitamin D  (400-800iu) and calcium  (800-1000mg ) for bone health. Discuss Dexa screening with PCP.   According to [place] law, you can not drive unless you are seizure / syncope free for at least 6 months and under physician's care.  Please maintain precautions. Do not participate in activities where a loss of awareness could harm you or someone else. No swimming alone, no tub bathing, no hot tubs, no driving, no operating motorized vehicles (cars, ATVs, motocycles, etc), lawnmowers, power tools or firearms. No standing at heights, such as rooftops, ladders or stairs. Avoid hot objects such as stoves, heaters, open fires. Wear a helmet when riding a bicycle, scooter, skateboard, etc. and avoid areas of traffic. Set your water  heater to 120 degrees or less.  SUDEP is the sudden, unexpected death of someone with epilepsy, who was otherwise healthy. In SUDEP cases, no other cause of death is found when an autopsy is done. Each year, more than 1 in 1,000 people with epilepsy die from SUDEP. This is the leading cause of death in people with uncontrolled seizures. Until further answers are available, the best way to prevent SUDEP is to lower your risk by controlling  seizures. Research has found that people with all types of epilepsy that experience convulsive seizures can be at risk.  Please make sure you are staying well hydrated. I recommend 50-60 ounces daily. Well balanced diet and regular exercise encouraged. Consistent sleep schedule with 6-8 hours recommended.   Please continue follow up with care team as directed.   Follow up with me in 1 year, sooner if needed   You may receive a survey regarding today's visit. I encourage you to leave honest feed back as I do use this information to improve patient care. Thank you for seeing me today!

## 2023-10-23 ENCOUNTER — Telehealth: Payer: Self-pay | Admitting: *Deleted

## 2023-10-23 MED ORDER — LEVETIRACETAM ER 500 MG PO TB24: 1000.0000 mg | ORAL_TABLET | Freq: Every day | ORAL | 3 refills | Status: AC

## 2023-10-23 NOTE — Telephone Encounter (Signed)
 Pt has returned call to [facility]

## 2023-10-23 NOTE — Telephone Encounter (Signed)
 Jason Mcmahon from [facility] called stating they can't order the Levetriacetam ER 1000 mg generic, they can get brand, but its not covered by insurance.   They can get levertriacetam ER in 500 mg or 750 mg doses.   Please advise

## 2023-10-23 NOTE — Telephone Encounter (Addendum)
 Left message for patient to call.

## 2023-10-23 NOTE — Telephone Encounter (Signed)
 Spoke with patient he would like to keep the same pharmacy. He states he is currently taking 2 tablets of the  (500 mg) tablets now.    Amy I tried to find the levetiracetam  ER  500mg , but having a hard time finding the ER part for this dose.   I was going to get ahead and send it in, can you see if you can find it and send Rx to [facility].  Thanks!

## 2023-10-24 ENCOUNTER — Encounter: Payer: Self-pay | Admitting: Family Medicine

## 2023-10-31 ENCOUNTER — Other Ambulatory Visit: Payer: Self-pay | Admitting: Gastroenterology

## 2023-10-31 DIAGNOSIS — K219 Gastro-esophageal reflux disease without esophagitis: Secondary | ICD-10-CM

## 2023-11-05 NOTE — Progress Notes (Signed)
 Referring Provider: McClanahan, Kyra, NP Primary Care Physician:  McClanahan, Kyra, NP Primary GI Physician: Dr. Riley Cheadle  Chief Complaint  Patient presents with   Follow-up    Follow up. No problems     HPI:   Jason Mcmahon is a 58 y.o. male  with history of GERD, adenomatous colon polyps due for repeat colonoscopy in 2026, PBC, stroke in November 2024 started on Plavix  and atorvastatin , presenting today for follow-up.   Last seen in the office 06/03/2023.  He was doing well from a GI standpoint with no significant symptoms.  Chronic GERD controlled on omeprazole  20 mg daily.  Had been compliant with ursodiol .  Blood pressure was significantly elevated though he had not taken his medications and was asymptomatic.  Plan to update labs, ultrasound in February, continue current medications.   Labs completed 06/14/2023.  TSH within normal limits.  HFP within normal limits aside from direct bilirubin of 0.3 though total bilirubin normal at 0.8.  RUQ ultrasound 10/03/23 with normal-appearing liver, no focal lesion.  Labs on 10/15/2023 showed total bilirubin 1.1, direct bilirubin 0.46 (H), alk phos 152 (H), AST and ALT within normal limits.    Today: Reports he is doing well overall.  He is not having any abdominal pain, abdominal distention, lower extremity edema, yellowing of eyes or skin, bruising, bleeding, mental status changes.  No nausea, vomiting.  GERD is well-controlled.  No bowel concerns.  No BRBPR or melena.  States he has not changed anything.  He has been trying to eat a healthy diet. No recent medication changes.  Does not really want to add any new medications or increase his medication if he does not have to.  Wt Readings from Last 3 Encounters:  11/06/23 188 lb 6.4 oz (85.5 kg)  10/22/23 185 lb 12.8 oz (84.3 kg)  06/03/23 191 lb 3.2 oz (86.7 kg)    Currently overdue for DEXA, but holding off for now at patient's request. Last DEXA was in July 2022 and was normal.    Yearly Lipid Panel: Completed 05/20/2023 with total cholesterol 138, triglycerides 125, LDL 86, HDL 27.  TSH wnl December 2024.     Past Medical History:  Diagnosis Date   Acid reflux    Gout    Hypertension    Primary biliary cholangitis (HCC)    Seizures (HCC)    started on Keppra  in April 2022   Stroke [facility])    Nov 2024    Past Surgical History:  Procedure Laterality Date   arm surgery     COLONOSCOPY N/A 02/28/2016   Surgeon: Alanda Allegra, MD; 2 mm tubular adenoma in the ascending colon, 8 mm tubular adenoma in the sigmoid colon, otherwise normal exam.  Repeat in 3 years.   COLONOSCOPY N/A 02/05/2020   Surgeon: Suzette Espy, MD;  Diverticulosis in the sigmoid and descending colon, otherwise normal exam, repeat in 5 years.   ESOPHAGOGASTRODUODENOSCOPY N/A 02/22/2021   Surgeon: Suzette Espy, MD;  Single 5 mm erosion straddling GE junction, otherwise normal esophagus, small hiatal hernia, otherwise normal exam.    Current Outpatient Medications  Medication Sig Dispense Refill   allopurinol  (ZYLOPRIM ) 300 MG tablet Take 300 mg by mouth daily.     atorvastatin  (LIPITOR) 40 MG tablet Take 1 tablet (40 mg total) by mouth daily. 90 tablet 3   Cholecalciferol (VITAMIN D3) 50 MCG (2000 UT) capsule Take 2 capsules (4,000 Units total) by mouth daily. (Patient taking differently: Take 2,000 Units by  mouth daily.) 60 capsule 2   clopidogrel  (PLAVIX ) 75 MG tablet Take 1 tablet (75 mg total) by mouth daily. 90 tablet 3   irbesartan  (AVAPRO ) 150 MG tablet Take 1 tablet (150 mg total) by mouth daily. 90 tablet 3   levETIRAcetam  (KEPPRA  XR) 500 MG 24 hr tablet Take 2 tablets (1,000 mg total) by mouth daily. 180 tablet 3   pantoprazole  (PROTONIX ) 20 MG tablet TAKE 1 TABLET(20 MG) BY MOUTH DAILY. 90 tablet 3   sildenafil (VIAGRA) 25 MG tablet Take 25-100 mg by mouth daily as needed for erectile dysfunction.     ursodiol  (ACTIGALL ) 500 MG tablet TAKE 1 TABLET(500 MG) BY MOUTH TWICE  DAILY 180 tablet 3   febuxostat  (ULORIC ) 40 MG tablet Take by mouth. (Patient not taking: Reported on 05/20/2023)     No current facility-administered medications for this visit.    Allergies as of 11/06/2023 - Review Complete 11/06/2023  Allergen Reaction Noted   Aspirin Anaphylaxis 10/11/2020   Lisinopril  Cough 07/19/2021    Family History  Problem Relation Age of Onset   Liver disease Father        Alcoholic   Hypertension Father    Cancer Father    Lung cancer Father    Diabetes Paternal Grandmother    Seizures Paternal Aunt    Colon cancer Neg Hx     Social History   Socioeconomic History   Marital status: Married    Spouse name: Not on file   Number of children: Not on file   Years of education: Not on file   Highest education level: Not on file  Occupational History   Not on file  Tobacco Use   Smoking status: Former    Current packs/day: 3.00    Average packs/day: 3.0 packs/day for 18.0 years (54.0 ttl pk-yrs)    Types: Cigarettes   Smokeless tobacco: Never  Vaping Use   Vaping status: Never Used  Substance and Sexual Activity   Alcohol use: No   Drug use: No   Sexual activity: Not Currently  Other Topics Concern   Not on file  Social History Narrative   Not on file   Social Drivers of Health   Financial Resource Strain: Low Risk  (07/17/2023)   [facility]   Overall Financial Resource Strain (CARDIA)    Difficulty of Paying Living Expenses: Not hard at all  Food Insecurity: No Food Insecurity (07/17/2023)   [facility]   Hunger Vital Sign    Worried About Running Out of Food in the Last Year: Never true    Ran Out of Food in the Last Year: Never true  Transportation Needs: No Transportation Needs (07/17/2023)   Received from [facility] - Transportation    Lack of Transportation (Medical): No    Lack of Transportation (Non-Medical): No  Physical Activity: Insufficiently Active (04/12/2023)   Received  from [facility]   Exercise Vital Sign    Days of Exercise per Week: 1 day    Minutes of Exercise per Session: 60 min  Stress: No Stress Concern Present (04/12/2023)   Received from [facility] - Occupational Stress Questionnaire    Feeling of Stress : Not at all  Social Connections: Socially Integrated (04/12/2023)   Received from [facility]   Social Network    How would you rate your social network (family, work, friends)?: Good participation with social networks  Review of Systems: Gen: Denies fever, chills, cold or flulike symptoms, presyncope, syncope. CV: Denies chest pain, palpitations. Resp: Denies dyspnea, cough. GI: See HPI Heme: See HPI  Physical Exam: BP 131/74 (BP Location: Right Arm, Patient Position: Sitting, Cuff Size: Normal)   Pulse 99   Temp 98.1 F (36.7 C) (Temporal)   Ht 5' 8.5" (1.74 m)   Wt 188 lb 6.4 oz (85.5 kg)   BMI 28.23 kg/m  General:   Alert and oriented. No distress noted. Pleasant and cooperative.  Head:  Normocephalic and atraumatic. Eyes:  Conjuctiva clear without scleral icterus. Heart:  S1, S2 present without murmurs appreciated. Lungs:  Clear to auscultation bilaterally. No wheezes, rales, or rhonchi. No distress.  Abdomen:  +BS, soft, non-tender and non-distended. No rebound or guarding. No HSM or masses noted. Msk:  Symmetrical without gross deformities. Normal posture. Extremities:  Without edema. Neurologic:  Alert and  oriented x4 Psych:  Normal mood and affect.    Assessment:  58 y.o. male  with history of GERD, adenomatous colon polyps due for repeat colonoscopy in 2026, PBC, stroke in November 2024 started on Plavix  and atorvastatin , presenting today for follow-up.   PBC/elevated alk phos:  AMA and ASMA positive.  Underwent liver biopsy in 2020 with findings compatible with PBC though not diagnostic, possible overlap autoimmune hepatitis or drug-induced liver injury also  considered. Has been maintaining on ursodiol  500 mg twice daily.  Clinically, doing very well.  No alcohol intake.  Liver enzymes normalized May 2022.  He had slight bump in bilirubin to 1.3 in November 2024, but this normalized in December.  With recent labs 10/15/2023 with bilirubin 1.1, direct bilirubin 0.46, alkaline phosphatase bumped to 152 AST and ALT remaining within normal limits.  This is concerning for possible progression of PBC.  Unclear if atorvastatin  may also be playing a role as this was started in November following stroke though I would anticipate other LFTs to be elevated as well if it was specifically drug-induced.  At this point, we will plan to repeat labs around May 15 to see if alk phos remains elevated.  If persistent elevation or increasing alk phos, may need to consider adjusting PBC medications though patient prefers not to do this if possible.    Medication adjustment options include increasing ursodiol  to 750 mg in the morning and continuing 500 mg in the evening.  This will be max to weight-based dose. Considering history of thrombocytopenia, would not be a candidate for [place].  Could consider Livdelzi (seladelpar), but would likely discuss case with Duey Ghent, NP with [facility] prior to adding seladelpar.   Notably, due to chronic mild thrombocytopenia and mildly elevated kPa at 9.6 in January 2022, there was some concern for early cirrhosis and EGD was pursued in August 2022, but did not eveal any varices or portal hypertensive gastropathy.  Dr. Riley Cheadle recommended clinical progress including labs and imaging findings should guide future endoscopy.  Most recent CBC in November with very slight thrombocytopenia of 146.  Recent RUQ ultrasound 10/03/2023 with upper normal parenchymal echogenicity, but no overt cirrhosis and no focal liver lesion.    Lipid panel normal earlier this year.  TSH up to date. Overdue for DEXA, but patient has requested to hold off on this  for now.    Plan:  CBC, HFP on May 15 or after. Continue ursodiol  500 mg twice daily for now. Further recommendations pending lab results.    Shana Daring, PA-C [facility] 11/06/2023

## 2023-11-06 ENCOUNTER — Ambulatory Visit (INDEPENDENT_AMBULATORY_CARE_PROVIDER_SITE_OTHER): Admitting: Gastroenterology

## 2023-11-06 ENCOUNTER — Encounter: Payer: Self-pay | Admitting: Gastroenterology

## 2023-11-06 VITALS — BP 131/74 | HR 99 | Temp 98.1°F | Ht 68.5 in | Wt 188.4 lb

## 2023-11-06 DIAGNOSIS — K743 Primary biliary cirrhosis: Secondary | ICD-10-CM

## 2023-11-06 DIAGNOSIS — D696 Thrombocytopenia, unspecified: Secondary | ICD-10-CM | POA: Diagnosis not present

## 2023-11-06 DIAGNOSIS — R748 Abnormal levels of other serum enzymes: Secondary | ICD-10-CM | POA: Diagnosis not present

## 2023-11-06 NOTE — Patient Instructions (Signed)
 Have blood work completed around May 15th or after.   We will reach you with results and come up with a plan thereafter.   Shana Daring, PA-C [facility]

## 2023-11-08 ENCOUNTER — Encounter: Payer: Self-pay | Admitting: Gastroenterology

## 2023-11-20 LAB — CBC WITH DIFFERENTIAL/PLATELET
Basophils Absolute: 0 10*3/uL (ref 0.0–0.2)
Basos: 1 %
EOS (ABSOLUTE): 0.1 10*3/uL (ref 0.0–0.4)
Eos: 2 %
Hematocrit: 41 % (ref 37.5–51.0)
Hemoglobin: 13.7 g/dL (ref 13.0–17.7)
Immature Grans (Abs): 0 10*3/uL (ref 0.0–0.1)
Immature Granulocytes: 1 %
Lymphocytes Absolute: 1.4 10*3/uL (ref 0.7–3.1)
Lymphs: 23 %
MCH: 32.9 pg (ref 26.6–33.0)
MCHC: 33.4 g/dL (ref 31.5–35.7)
MCV: 98 fL — ABNORMAL HIGH (ref 79–97)
Monocytes Absolute: 0.5 10*3/uL (ref 0.1–0.9)
Monocytes: 9 %
Neutrophils Absolute: 3.8 10*3/uL (ref 1.4–7.0)
Neutrophils: 64 %
Platelets: 187 10*3/uL (ref 150–450)
RBC: 4.17 x10E6/uL (ref 4.14–5.80)
RDW: 14.8 % (ref 11.6–15.4)
WBC: 5.8 10*3/uL (ref 3.4–10.8)

## 2023-11-20 LAB — HEPATIC FUNCTION PANEL
ALT: 12 IU/L (ref 0–44)
AST: 14 IU/L (ref 0–40)
Albumin: 4.4 g/dL (ref 3.8–4.9)
Alkaline Phosphatase: 154 IU/L — ABNORMAL HIGH (ref 44–121)
Bilirubin Total: 1.1 mg/dL (ref 0.0–1.2)
Bilirubin, Direct: 0.42 mg/dL — ABNORMAL HIGH (ref 0.00–0.40)
Total Protein: 6.8 g/dL (ref 6.0–8.5)

## 2023-11-25 ENCOUNTER — Ambulatory Visit: Payer: Self-pay | Admitting: Gastroenterology

## 2023-11-25 DIAGNOSIS — R748 Abnormal levels of other serum enzymes: Secondary | ICD-10-CM

## 2023-11-25 DIAGNOSIS — K743 Primary biliary cirrhosis: Secondary | ICD-10-CM

## 2023-11-27 ENCOUNTER — Telehealth: Payer: Self-pay | Admitting: Family Medicine

## 2023-11-27 NOTE — Telephone Encounter (Signed)
 FYI

## 2023-11-27 NOTE — Telephone Encounter (Signed)
 Pt wife called stating that the pt wanted to inform provider that he was see by Dr. Donnice Gale and had lab work done and some of his numbers were high. His Bilirubin was-0.42 and his alkaline was -154. Dr Donnice Gale had the pt get off of his atorvastatin  (LIPITOR) 40 MG tablet for 30 days to see if that is what's causing the high numbers. When the 30 days are up he will be having updated labs done and they will go from there when results come in.

## 2024-01-02 ENCOUNTER — Other Ambulatory Visit: Payer: Self-pay | Admitting: *Deleted

## 2024-01-02 DIAGNOSIS — K743 Primary biliary cirrhosis: Secondary | ICD-10-CM

## 2024-01-04 ENCOUNTER — Other Ambulatory Visit: Payer: Self-pay | Admitting: Gastroenterology

## 2024-01-04 DIAGNOSIS — K219 Gastro-esophageal reflux disease without esophagitis: Secondary | ICD-10-CM

## 2024-01-08 ENCOUNTER — Ambulatory Visit: Payer: Self-pay | Admitting: Gastroenterology

## 2024-01-08 LAB — HEPATIC FUNCTION PANEL
ALT: 18 IU/L (ref 0–44)
AST: 22 IU/L (ref 0–40)
Albumin: 4.6 g/dL (ref 3.8–4.9)
Alkaline Phosphatase: 118 IU/L (ref 44–121)
Bilirubin Total: 1.1 mg/dL (ref 0.0–1.2)
Bilirubin, Direct: 0.37 mg/dL (ref 0.00–0.40)
Total Protein: 7.3 g/dL (ref 6.0–8.5)

## 2024-04-10 ENCOUNTER — Encounter: Payer: Self-pay | Admitting: Gastroenterology

## 2024-06-14 ENCOUNTER — Other Ambulatory Visit: Payer: Self-pay | Admitting: Gastroenterology

## 2024-06-14 DIAGNOSIS — K743 Primary biliary cirrhosis: Secondary | ICD-10-CM

## 2024-09-14 ENCOUNTER — Ambulatory Visit: Admitting: Gastroenterology

## 2024-10-26 ENCOUNTER — Ambulatory Visit: Admitting: Family Medicine
